# Patient Record
Sex: Male | Born: 1965
Health system: Southern US, Community
[De-identification: ages and names within clinical notes are randomized; demographics above are authoritative.]

## PROBLEM LIST (undated history)

## (undated) DIAGNOSIS — J45909 Unspecified asthma, uncomplicated: Secondary | ICD-10-CM

## (undated) DIAGNOSIS — K2270 Barrett's esophagus without dysplasia: Secondary | ICD-10-CM

## (undated) DIAGNOSIS — C9110 Chronic lymphocytic leukemia of B-cell type not having achieved remission: Secondary | ICD-10-CM

## (undated) DIAGNOSIS — Z9109 Other allergy status, other than to drugs and biological substances: Secondary | ICD-10-CM

## (undated) DIAGNOSIS — I10 Essential (primary) hypertension: Secondary | ICD-10-CM

## (undated) DIAGNOSIS — R011 Cardiac murmur, unspecified: Secondary | ICD-10-CM

## (undated) DIAGNOSIS — K219 Gastro-esophageal reflux disease without esophagitis: Secondary | ICD-10-CM

## (undated) DIAGNOSIS — R5383 Other fatigue: Secondary | ICD-10-CM

## (undated) HISTORY — DX: Unspecified asthma, uncomplicated: J45.909

## (undated) HISTORY — DX: Gastro-esophageal reflux disease without esophagitis: K21.9

## (undated) HISTORY — PX: CHOLECYSTECTOMY: SHX55

## (undated) HISTORY — DX: Other fatigue: R53.83

## (undated) HISTORY — PX: APPENDECTOMY: SHX54

## (undated) HISTORY — DX: Chronic lymphocytic leukemia of B-cell type not having achieved remission: C91.10

## (undated) HISTORY — DX: Other allergy status, other than to drugs and biological substances: Z91.09

## (undated) HISTORY — DX: Barrett's esophagus without dysplasia: K22.70

## (undated) HISTORY — DX: Cardiac murmur, unspecified: R01.1

## (undated) HISTORY — DX: Essential (primary) hypertension: I10

## (undated) HISTORY — PX: WISDOM TOOTH EXTRACTION: SHX21

---

## 2013-09-14 ENCOUNTER — Ambulatory Visit: Payer: Self-pay | Admitting: Physician Assistant

## 2013-09-22 ENCOUNTER — Ambulatory Visit: Payer: Self-pay | Admitting: Physician Assistant

## 2013-11-24 ENCOUNTER — Ambulatory Visit (INDEPENDENT_AMBULATORY_CARE_PROVIDER_SITE_OTHER): Payer: BC Managed Care – PPO | Admitting: Physician Assistant

## 2013-11-24 ENCOUNTER — Ambulatory Visit: Payer: Self-pay | Admitting: Physician Assistant

## 2013-11-24 ENCOUNTER — Encounter: Payer: Self-pay | Admitting: Physician Assistant

## 2013-11-24 VITALS — BP 98/68 | HR 87 | Temp 98.7°F | Resp 16 | Ht 68.0 in | Wt 196.0 lb

## 2013-11-24 DIAGNOSIS — R1013 Epigastric pain: Secondary | ICD-10-CM

## 2013-11-24 DIAGNOSIS — G8929 Other chronic pain: Secondary | ICD-10-CM

## 2013-11-24 DIAGNOSIS — R5383 Other fatigue: Secondary | ICD-10-CM

## 2013-11-24 DIAGNOSIS — Z0000 Encounter for general adult medical examination without abnormal findings: Secondary | ICD-10-CM

## 2013-11-24 DIAGNOSIS — K219 Gastro-esophageal reflux disease without esophagitis: Secondary | ICD-10-CM

## 2013-11-24 DIAGNOSIS — R5381 Other malaise: Secondary | ICD-10-CM

## 2013-11-24 DIAGNOSIS — I10 Essential (primary) hypertension: Secondary | ICD-10-CM

## 2013-11-24 NOTE — Progress Notes (Signed)
Patient presents to clinic today to establish care.  Acute Concerns: Fatigue -- Patient endorses fatigue over the past several months.  Denies change in libido or erectile dysfunction.  Denies change in stress levels.  Denies depressed mood.  Denies hx of MI or CVA.  Denies chest pain or shortness of breath.  Patient endorses sleeping "decent" at night.  Denies fever, night sweats, unintentional weight loss.  Denies hx of cancer.  Endorses history of elevated WBC count.  States he was told by previous MD that it would be monitored.    Chronic Issues: Hypertension -- Patient currently on Lisinopril 20 mg daily.  Endorses taking medications as prescribed. Denies chronic cough.  BP 100/78 at today's visit.  Patient endorses BP is normally in the 120-130s/80s range according to patient.  Denies chest pain, palpitations, shortness of breath, lightheadedness.   Denies chronic cough.  GERD -- well-controlled with omeprazole.  Chronic Abdominal Pain -- Patient has seen previous specialist and had colonoscopy and EGD without abnormalities.  Is unsure if he has ever been told he has an H. Pylori infection.     Health Maintenance: Dental -- UTD Vision -- UTD Immunizations -- Last Tetanus 8 years ago. Colonoscopy -- Last in 2014 due to GI complaints. Some polyps noted -- benign. Due in 2019.  Past Medical History  Diagnosis Date  . GERD (gastroesophageal reflux disease)   . HTN (hypertension)   . Environmental allergies   . Asthma     childhood, resolved    Past Surgical History  Procedure Laterality Date  . Appendectomy    . Wisdom tooth extraction    . Cholecystectomy      No current outpatient prescriptions on file prior to visit.   No current facility-administered medications on file prior to visit.    No Known Allergies  Family History  Problem Relation Age of Onset  . Hypertension Mother     Living  . Hypertension Father 7    Deceased  . Hypertension Maternal Grandmother    . Hypertension Maternal Grandfather   . Healthy Sister   . Healthy Son     x1  . Healthy Daughter     x2  . Cancer Neg Hx   . Diabetes Neg Hx   . Heart disease Neg Hx    History   Social History  . Marital Status: Married    Spouse Name: N/A    Number of Children: N/A  . Years of Education: N/A   Occupational History  . Not on file.   Social History Main Topics  . Smoking status: Never Smoker   . Smokeless tobacco: Never Used  . Alcohol Use: 1.8 oz/week    3 Glasses of wine per week  . Drug Use: No  . Sexual Activity: Not on file   Other Topics Concern  . Not on file   Social History Narrative  . No narrative on file   Review of Systems  Constitutional: Negative for fever and weight loss.  HENT: Negative for ear pain, hearing loss and tinnitus.   Eyes: Negative for blurred vision, double vision, photophobia and pain.  Respiratory: Negative for cough and shortness of breath.   Cardiovascular: Negative for chest pain and palpitations.  Gastrointestinal: Positive for heartburn and abdominal pain. Negative for nausea, vomiting, diarrhea, constipation, blood in stool and melena.  Genitourinary: Negative for dysuria, urgency, frequency, hematuria and flank pain.       Nocturia x 0-1.  Neurological: Positive for headaches. Negative  for dizziness and loss of consciousness.  Psychiatric/Behavioral: Negative for depression, suicidal ideas, hallucinations and substance abuse. The patient is not nervous/anxious.    BP 98/68  Pulse 87  Temp(Src) 98.7 F (37.1 C) (Oral)  Resp 16  Ht 5\' 8"  (1.727 m)  Wt 196 lb (88.905 kg)  BMI 29.81 kg/m2  SpO2 98%  Physical Exam  Vitals reviewed. Constitutional: He is oriented to person, place, and time and well-developed, well-nourished, and in no distress.  HENT:  Head: Normocephalic and atraumatic.  Right Ear: External ear normal.  Left Ear: External ear normal.  Nose: Nose normal.  Mouth/Throat: Oropharynx is clear and moist.  No oropharyngeal exudate.  TM within normal limits bilaterally.  Eyes: Conjunctivae and EOM are normal. Pupils are equal, round, and reactive to light.  Neck: Neck supple. No thyromegaly present.  Cardiovascular: Normal rate, regular rhythm, normal heart sounds and intact distal pulses.   No murmur heard. Pulmonary/Chest: Effort normal and breath sounds normal. No respiratory distress. He has no wheezes. He has no rales. He exhibits no tenderness.  Abdominal: Soft. Bowel sounds are normal. He exhibits no distension and no mass. There is no tenderness. There is no rebound and no guarding.  Lymphadenopathy:    He has no cervical adenopathy.  Neurological: He is alert and oriented to person, place, and time. No cranial nerve deficit.  Skin: Skin is warm and dry. No rash noted.  Psychiatric: Affect normal.   Assessment/Plan: Visit for preventive health examination Medical history reviewed and updated.  Will obtain fasting labs.  Fatigue Will obtain lab workup including CBC, CMP, TSH, Testosterone, etc.  IF leukocytosis is present, giving history we will refer to Hematology/Oncology for further assessment.  Abdominal pain, chronic, epigastric Has had previous workup.  Will recheck CBC, CMP, lipase and H. Pylori.  Continue PPI.  If workup unremarkable, will set up with GI.  Hypertension Asymptomatic.  Continue current regimen.  Will continue to monitor BP at subsequent visits.  GERD (gastroesophageal reflux disease) Endorses good symptoms relief with daily omeprazole.  Continue current regimen.

## 2013-11-24 NOTE — Progress Notes (Signed)
Pre visit review using our clinic review tool, if applicable. No additional management support is needed unless otherwise documented below in the visit note/SLS  

## 2013-11-24 NOTE — Patient Instructions (Signed)
Please obtain labs.  I will call you with your results.  Please continue medications as directed.  I will call you once I have received your records from [place]. We will set you up with a specialist here for further evaluation of your abdominal symptoms.  Fatigue Fatigue is a feeling of tiredness, lack of energy, lack of motivation, or feeling tired all the time. Having enough rest, good nutrition, and reducing stress will normally reduce fatigue. Consult your caregiver if it persists. The nature of your fatigue will help your caregiver to find out its cause. The treatment is based on the cause.  CAUSES  There are many causes for fatigue. Most of the time, fatigue can be traced to one or more of your habits or routines. Most causes fit into one or more of three general areas. They are: Lifestyle problems  Sleep disturbances.  Overwork.  Physical exertion.  Unhealthy habits.  Poor eating habits or eating disorders.  Alcohol and/or drug use .  Lack of proper nutrition (malnutrition). Psychological problems  Stress and/or anxiety problems.  Depression.  Grief.  Boredom. Medical Problems or Conditions  Anemia.  Pregnancy.  Thyroid gland problems.  Recovery from major surgery.  Continuous pain.  Emphysema or asthma that is not well controlled  Allergic conditions.  Diabetes.  Infections (such as mononucleosis).  Obesity.  Sleep disorders, such as sleep apnea.  Heart failure or other heart-related problems.  Cancer.  Kidney disease.  Liver disease.  Effects of certain medicines such as antihistamines, cough and cold remedies, prescription pain medicines, heart and blood pressure medicines, drugs used for treatment of cancer, and some antidepressants. SYMPTOMS  The symptoms of fatigue include:   Lack of energy.  Lack of drive (motivation).  Drowsiness.  Feeling of indifference to the surroundings. DIAGNOSIS  The details of how you feel help  guide your caregiver in finding out what is causing the fatigue. You will be asked about your present and past health condition. It is important to review all medicines that you take, including prescription and non-prescription items. A thorough exam will be done. You will be questioned about your feelings, habits, and normal lifestyle. Your caregiver may suggest blood tests, urine tests, or other tests to look for common medical causes of fatigue.  TREATMENT  Fatigue is treated by correcting the underlying cause. For example, if you have continuous pain or depression, treating these causes will improve how you feel. Similarly, adjusting the dose of certain medicines will help in reducing fatigue.  HOME CARE INSTRUCTIONS   Try to get the required amount of good sleep every night.  Eat a healthy and nutritious diet, and drink enough water throughout the day.  Practice ways of relaxing (including yoga or meditation).  Exercise regularly.  Make plans to change situations that cause stress. Act on those plans so that stresses decrease over time. Keep your work and personal routine reasonable.  Avoid street drugs and minimize use of alcohol.  Start taking a daily multivitamin after consulting your caregiver. SEEK MEDICAL CARE IF:   You have persistent tiredness, which cannot be accounted for.  You have fever.  You have unintentional weight loss.  You have headaches.  You have disturbed sleep throughout the night.  You are feeling sad.  You have constipation.  You have dry skin.  You have gained weight.  You are taking any new or different medicines that you suspect are causing fatigue.  You are unable to sleep at night.  You develop  any unusual swelling of your legs or other parts of your body. SEEK IMMEDIATE MEDICAL CARE IF:   You are feeling confused.  Your vision is blurred.  You feel faint or pass out.  You develop severe headache.  You develop severe abdominal,  pelvic, or back pain.  You develop chest pain, shortness of breath, or an irregular or fast heartbeat.  You are unable to pass a normal amount of urine.  You develop abnormal bleeding such as bleeding from the rectum or you vomit blood.  You have thoughts about harming yourself or committing suicide.  You are worried that you might harm someone else. MAKE SURE YOU:   Understand these instructions.  Will watch your condition.  Will get help right away if you are not doing well or get worse. Document Released: 05/11/2007 Document Revised: 10/06/2011 Document Reviewed: 05/11/2007 [facility] Patient Information 2014 [place].

## 2013-11-25 ENCOUNTER — Telehealth: Payer: Self-pay | Admitting: Physician Assistant

## 2013-11-25 DIAGNOSIS — D72829 Elevated white blood cell count, unspecified: Secondary | ICD-10-CM

## 2013-11-25 LAB — URINALYSIS, ROUTINE W REFLEX MICROSCOPIC
Bilirubin Urine: NEGATIVE
Glucose, UA: NEGATIVE mg/dL
Hgb urine dipstick: NEGATIVE
Ketones, ur: NEGATIVE mg/dL
Leukocytes, UA: NEGATIVE
Nitrite: NEGATIVE
Protein, ur: NEGATIVE mg/dL
Specific Gravity, Urine: 1.017 (ref 1.005–1.030)
Urobilinogen, UA: 0.2 mg/dL (ref 0.0–1.0)
pH: 5 (ref 5.0–8.0)

## 2013-11-25 LAB — TSH: TSH: 0.706 u[IU]/mL (ref 0.350–4.500)

## 2013-11-25 LAB — HEPATIC FUNCTION PANEL
ALT: 24 U/L (ref 0–53)
AST: 21 U/L (ref 0–37)
Albumin: 4.8 g/dL (ref 3.5–5.2)
Alkaline Phosphatase: 62 U/L (ref 39–117)
Bilirubin, Direct: 0.1 mg/dL (ref 0.0–0.3)
Indirect Bilirubin: 0.7 mg/dL (ref 0.2–1.2)
Total Bilirubin: 0.8 mg/dL (ref 0.2–1.2)
Total Protein: 6.8 g/dL (ref 6.0–8.3)

## 2013-11-25 LAB — H. PYLORI ANTIBODY, IGG: H Pylori IgG: <0.4 {ISR}

## 2013-11-25 LAB — CBC WITH DIFFERENTIAL/PLATELET
Basophils Absolute: 0 10*3/uL (ref 0.0–0.1)
Basophils Relative: 0 % (ref 0–1)
Eosinophils Absolute: 0 10*3/uL (ref 0.0–0.7)
Eosinophils Relative: 0 % (ref 0–5)
HCT: 39.6 % (ref 39.0–52.0)
Hemoglobin: 14 g/dL (ref 13.0–17.0)
Lymphocytes Relative: 83 % — ABNORMAL HIGH (ref 12–46)
Lymphs Abs: 22.4 10*3/uL — ABNORMAL HIGH (ref 0.7–4.0)
MCH: 31.3 pg (ref 26.0–34.0)
MCHC: 35.4 g/dL (ref 30.0–36.0)
MCV: 88.6 fL (ref 78.0–100.0)
Monocytes Absolute: 0.8 10*3/uL (ref 0.1–1.0)
Monocytes Relative: 3 % (ref 3–12)
Neutro Abs: 3.8 10*3/uL (ref 1.7–7.7)
Neutrophils Relative %: 14 % — ABNORMAL LOW (ref 43–77)
Platelets: 119 10*3/uL — ABNORMAL LOW (ref 150–400)
RBC: 4.47 MIL/uL (ref 4.22–5.81)
RDW: 13.4 % (ref 11.5–15.5)
WBC: 27 10*3/uL — ABNORMAL HIGH (ref 4.0–10.5)

## 2013-11-25 LAB — URINALYSIS, MICROSCOPIC ONLY
Bacteria, UA: NONE SEEN
Casts: NONE SEEN
Crystals: NONE SEEN
Squamous Epithelial / LPF: NONE SEEN

## 2013-11-25 LAB — PATHOLOGIST SMEAR REVIEW

## 2013-11-25 LAB — TESTOSTERONE, FREE, TOTAL, SHBG
Sex Hormone Binding: 32 nmol/L (ref 13–71)
Testosterone, Free: 52 pg/mL (ref 47.0–244.0)
Testosterone-% Free: 2 % (ref 1.6–2.9)
Testosterone: 263 ng/dL — ABNORMAL LOW (ref 300–890)

## 2013-11-25 LAB — BASIC METABOLIC PANEL WITH GFR
BUN: 15 mg/dL (ref 6–23)
CO2: 26 mEq/L (ref 19–32)
Calcium: 9.8 mg/dL (ref 8.4–10.5)
Chloride: 102 mEq/L (ref 96–112)
Creat: 1.14 mg/dL (ref 0.50–1.35)
GFR, Est African American: 87 mL/min
GFR, Est Non African American: 76 mL/min
Glucose, Bld: 96 mg/dL (ref 70–99)
Potassium: 4 mEq/L (ref 3.5–5.3)
Sodium: 139 mEq/L (ref 135–145)

## 2013-11-25 LAB — HEMOGLOBIN A1C
Hgb A1c MFr Bld: 5.5 % (ref <5.7)
Mean Plasma Glucose: 111 mg/dL (ref <117)

## 2013-11-25 LAB — LIPASE: Lipase: 18 U/L (ref 0–75)

## 2013-11-25 NOTE — Telephone Encounter (Signed)
Discussed results with patient.  Testosterone low.  Patient will need to return in 3-4 weeks for repeat testosterone level to confirm deficiency.  Significant WBC elevation with history of leukocytosis.  Urgent referral placed to Hematology/Oncology.  Patient voices understanding.  Follow-up in 3 weeks.

## 2013-11-28 DIAGNOSIS — I10 Essential (primary) hypertension: Secondary | ICD-10-CM | POA: Insufficient documentation

## 2013-11-28 DIAGNOSIS — Z0000 Encounter for general adult medical examination without abnormal findings: Secondary | ICD-10-CM | POA: Insufficient documentation

## 2013-11-28 DIAGNOSIS — G8929 Other chronic pain: Secondary | ICD-10-CM | POA: Insufficient documentation

## 2013-11-28 DIAGNOSIS — R5383 Other fatigue: Secondary | ICD-10-CM | POA: Insufficient documentation

## 2013-11-28 DIAGNOSIS — R1013 Epigastric pain: Secondary | ICD-10-CM

## 2013-11-28 DIAGNOSIS — K219 Gastro-esophageal reflux disease without esophagitis: Secondary | ICD-10-CM | POA: Insufficient documentation

## 2013-11-28 LAB — VITAMIN D 1,25 DIHYDROXY
Vitamin D 1, 25 (OH)2 Total: 56 pg/mL (ref 18–72)
Vitamin D2 1, 25 (OH)2: <8 pg/mL
Vitamin D3 1, 25 (OH)2: 56 pg/mL

## 2013-11-28 NOTE — Assessment & Plan Note (Signed)
Will obtain lab workup including CBC, CMP, TSH, Testosterone, etc.  IF leukocytosis is present, giving history we will refer to Hematology/Oncology for further assessment.

## 2013-11-28 NOTE — Assessment & Plan Note (Signed)
Asymptomatic.  Continue current regimen.  Will continue to monitor BP at subsequent visits.

## 2013-11-28 NOTE — Assessment & Plan Note (Signed)
Endorses good symptoms relief with daily omeprazole.  Continue current regimen.

## 2013-11-28 NOTE — Assessment & Plan Note (Signed)
Has had previous workup.  Will recheck CBC, CMP, lipase and H. Pylori.  Continue PPI.  If workup unremarkable, will set up with GI.

## 2013-11-28 NOTE — Assessment & Plan Note (Signed)
Medical history reviewed and updated.  Will obtain fasting labs. 

## 2013-12-05 ENCOUNTER — Encounter: Payer: Self-pay | Admitting: Physician Assistant

## 2013-12-05 ENCOUNTER — Telehealth: Payer: Self-pay | Admitting: Physician Assistant

## 2013-12-05 NOTE — Telephone Encounter (Signed)
Patient is requesting last lab results 

## 2013-12-05 NOTE — Telephone Encounter (Signed)
Result Notes    Notes Recorded by Leeanne Rio, PA-C on 11/25/2013 at 3:34 PM Called patient personally with results. See Phone note 11/25/13   Please Advise.

## 2013-12-05 NOTE — Telephone Encounter (Signed)
Taken from my telephone note on 11/25/13 -- "Discussed results with patient. Testosterone low. Patient will need to return in 3-4 weeks for repeat testosterone level to confirm deficiency. Significant WBC elevation with history of leukocytosis. Urgent referral placed to Hematology/Oncology. Patient voices understanding. Follow-up in 3 weeks."    As above, I have already discussed results with patient.  Is he requesting a printed copy?  Please clarify.

## 2013-12-06 NOTE — Telephone Encounter (Signed)
Called patient and he is requesting to pick up results. Printed results and will place at front desk

## 2014-04-05 ENCOUNTER — Telehealth: Payer: Self-pay | Admitting: Physician Assistant

## 2014-04-05 ENCOUNTER — Other Ambulatory Visit: Payer: Self-pay | Admitting: Physician Assistant

## 2014-04-05 MED ORDER — OMEPRAZOLE 40 MG PO CPDR: 40.0000 mg | DELAYED_RELEASE_CAPSULE | Freq: Every day | ORAL | Status: DC

## 2014-04-05 NOTE — Telephone Encounter (Signed)
Rx request to pharmacy/SLS  

## 2014-04-05 NOTE — Telephone Encounter (Signed)
Pt is needing new rx for omeprazole (PRILOSEC) 40 MG capsule send to [facility] on [address]

## 2014-05-12 ENCOUNTER — Encounter: Payer: Self-pay | Admitting: Physician Assistant

## 2014-05-12 ENCOUNTER — Ambulatory Visit (INDEPENDENT_AMBULATORY_CARE_PROVIDER_SITE_OTHER): Payer: BC Managed Care – PPO | Admitting: Physician Assistant

## 2014-05-12 VITALS — BP 144/108 | HR 92 | Temp 98.5°F | Resp 16 | Ht 68.0 in | Wt 194.5 lb

## 2014-05-12 DIAGNOSIS — I10 Essential (primary) hypertension: Secondary | ICD-10-CM

## 2014-05-12 DIAGNOSIS — J019 Acute sinusitis, unspecified: Secondary | ICD-10-CM

## 2014-05-12 DIAGNOSIS — J309 Allergic rhinitis, unspecified: Secondary | ICD-10-CM | POA: Insufficient documentation

## 2014-05-12 DIAGNOSIS — B9689 Other specified bacterial agents as the cause of diseases classified elsewhere: Secondary | ICD-10-CM

## 2014-05-12 MED ORDER — LISINOPRIL 20 MG PO TABS: 20.0000 mg | ORAL_TABLET | Freq: Every day | ORAL | Status: DC

## 2014-05-12 MED ORDER — FLUTICASONE PROPIONATE 50 MCG/ACT NA SUSP: 2.0000 | Freq: Every day | NASAL | Status: DC

## 2014-05-12 MED ORDER — METHYLPREDNISOLONE ACETATE 40 MG/ML IJ SUSP
40.0000 mg | Freq: Once | INTRAMUSCULAR | Status: AC
  Administered 2014-05-12: 40 mg via INTRAMUSCULAR

## 2014-05-12 MED ORDER — OMEPRAZOLE 40 MG PO CPDR: DELAYED_RELEASE_CAPSULE | ORAL | Status: DC

## 2014-05-12 MED ORDER — FEXOFENADINE HCL 180 MG PO TABS: 180.0000 mg | ORAL_TABLET | Freq: Every day | ORAL | Status: DC

## 2014-05-12 MED ORDER — AMOXICILLIN-POT CLAVULANATE 875-125 MG PO TABS: 1.0000 | ORAL_TABLET | Freq: Two times a day (BID) | ORAL | Status: DC

## 2014-05-12 NOTE — Progress Notes (Signed)
History of Present Illness: Todd Novak is a 48 y.o. male who present to the clinic today complaining of sinus pressure, sinus pain and nasal congestion. Patient endorses ear pain and tooth pain. Endorses difficulty sleeping. Patient denies fever, chills, SOB or pleuritic chest pain.  Denies recent travel or sick contact.  History: Past Medical History  Diagnosis Date  . GERD (gastroesophageal reflux disease)   . HTN (hypertension)   . Environmental allergies   . Asthma     childhood, resolved   Current outpatient prescriptions:acetaminophen (TYLENOL) 325 MG tablet, Take 650 mg by mouth as needed., Disp: , Rfl: ;  aspirin 325 MG tablet, Take 325 mg by mouth as needed., Disp: , Rfl: ;  lisinopril (PRINIVIL,ZESTRIL) 20 MG tablet, Take 1 tablet (20 mg total) by mouth daily., Disp: 90 tablet, Rfl: 1;  Multiple Vitamin (MULTIVITAMIN) tablet, Take 1 tablet by mouth daily., Disp: , Rfl:  omeprazole (PRILOSEC) 40 MG capsule, TAKE ONE CAPSULE BY MOUTH DAILY, Disp: 90 capsule, Rfl: 1;  pseudoephedrine (SUDAFED) 30 MG tablet, Take 30 mg by mouth every 4 (four) hours as needed for congestion (Allergies)., Disp: , Rfl: ;  amoxicillin-clavulanate (AUGMENTIN) 875-125 MG per tablet, Take 1 tablet by mouth 2 (two) times daily., Disp: 20 tablet, Rfl: 0 fexofenadine (ALLEGRA ALLERGY) 180 MG tablet, Take 1 tablet (180 mg total) by mouth daily., Disp: 30 tablet, Rfl: 3;  fluticasone (FLONASE) 50 MCG/ACT nasal spray, Place 2 sprays into both nostrils daily., Disp: 16 g, Rfl: 6 No Known Allergies Family History  Problem Relation Age of Onset  . Hypertension Mother     Living  . Hypertension Father 50    Deceased  . Hypertension Maternal Grandmother   . Hypertension Maternal Grandfather   . Healthy Sister   . Healthy Son     x1  . Healthy Daughter     x2  . Cancer Neg Hx   . Diabetes Neg Hx   . Heart disease Neg Hx    History   Social History  . Marital Status: Married    Spouse Name: N/A   Number of Children: N/A  . Years of Education: N/A   Social History Main Topics  . Smoking status: Never Smoker   . Smokeless tobacco: Never Used  . Alcohol Use: 1.8 oz/week    3 Glasses of wine per week  . Drug Use: No  . Sexual Activity: None   Other Topics Concern  . None   Social History Narrative  . None    Review of Systems: See HPI.  All other ROS are negative.  Physical Examination: BP 144/108  Pulse 92  Temp(Src) 98.5 F (36.9 C) (Oral)  Resp 16  Ht 5\' 8"  (1.727 m)  Wt 194 lb 8 oz (88.225 kg)  BMI 29.58 kg/m2  SpO2 99%  General appearance: alert, cooperative and appears stated age Head: Normocephalic, without obvious abnormality, atraumatic, sinuses tender to percussion Eyes: conjunctivae/corneas clear. PERRL, EOM's intact. Fundi benign. Ears: normal TM's and external ear canals both ears Nose: moderate congestion, turbinates swollen, sinus tenderness bilateral Throat: lips, mucosa, and tongue normal; teeth and gums normal Neck: no adenopathy, no carotid bruit, no JVD, supple, symmetrical, trachea midline and thyroid not enlarged, symmetric, no tenderness/mass/nodules Lungs: clear to auscultation bilaterally Chest wall: no tenderness  Labs/Diagnostics: None performed.  Assessment/Plan: Acute bacterial sinusitis Rx Augmentin.  40 mg IM Depomedrol given.  Increase fluids.  Rest.  Flonase and Allegra daily.  Humidifier in bedroom.  Return precautions  discussed with patient.  Rhinitis, allergic Rx Flonase and Allegra to use daily.

## 2014-05-12 NOTE — Assessment & Plan Note (Signed)
Rx Augmentin.  40 mg IM Depomedrol given.  Increase fluids.  Rest.  Flonase and Allegra daily.  Humidifier in bedroom.  Return precautions discussed with patient.

## 2014-05-12 NOTE — Assessment & Plan Note (Signed)
Rx Flonase and Allegra to use daily.

## 2014-05-12 NOTE — Addendum Note (Signed)
Addended by: Rockwell Germany on: 05/12/2014 04:55 PM   Modules accepted: Orders

## 2014-05-12 NOTE — Patient Instructions (Signed)
Please take antibiotic as directed.  Increase fluid intake.  Use Saline nasal spray.  Take a daily multivitamin. Use Flonase and Allegra daily.  Place a humidifier in the bedroom.  Please call or return clinic if symptoms are not improving.  Sinusitis Sinusitis is redness, soreness, and swelling (inflammation) of the paranasal sinuses. Paranasal sinuses are air pockets within the bones of your face (beneath the eyes, the middle of the forehead, or above the eyes). In healthy paranasal sinuses, mucus is able to drain out, and air is able to circulate through them by way of your nose. However, when your paranasal sinuses are inflamed, mucus and air can become trapped. This can allow bacteria and other germs to grow and cause infection. Sinusitis can develop quickly and last only a short time (acute) or continue over a long period (chronic). Sinusitis that lasts for more than 12 weeks is considered chronic.  CAUSES  Causes of sinusitis include:  Allergies.  Structural abnormalities, such as displacement of the cartilage that separates your nostrils (deviated septum), which can decrease the air flow through your nose and sinuses and affect sinus drainage.  Functional abnormalities, such as when the small hairs (cilia) that line your sinuses and help remove mucus do not work properly or are not present. SYMPTOMS  Symptoms of acute and chronic sinusitis are the same. The primary symptoms are pain and pressure around the affected sinuses. Other symptoms include:  Upper toothache.  Earache.  Headache.  Bad breath.  Decreased sense of smell and taste.  A cough, which worsens when you are lying flat.  Fatigue.  Fever.  Thick drainage from your nose, which often is green and may contain pus (purulent).  Swelling and warmth over the affected sinuses. DIAGNOSIS  Your caregiver will perform a physical exam. During the exam, your caregiver may:  Look in your nose for signs of abnormal growths  in your nostrils (nasal polyps).  Tap over the affected sinus to check for signs of infection.  View the inside of your sinuses (endoscopy) with a special imaging device with a light attached (endoscope), which is inserted into your sinuses. If your caregiver suspects that you have chronic sinusitis, one or more of the following tests may be recommended:  Allergy tests.  Nasal culture A sample of mucus is taken from your nose and sent to a lab and screened for bacteria.  Nasal cytology A sample of mucus is taken from your nose and examined by your caregiver to determine if your sinusitis is related to an allergy. TREATMENT  Most cases of acute sinusitis are related to a viral infection and will resolve on their own within 10 days. Sometimes medicines are prescribed to help relieve symptoms (pain medicine, decongestants, nasal steroid sprays, or saline sprays).  However, for sinusitis related to a bacterial infection, your caregiver will prescribe antibiotic medicines. These are medicines that will help kill the bacteria causing the infection.  Rarely, sinusitis is caused by a fungal infection. In theses cases, your caregiver will prescribe antifungal medicine. For some cases of chronic sinusitis, surgery is needed. Generally, these are cases in which sinusitis recurs more than 3 times per year, despite other treatments. HOME CARE INSTRUCTIONS   Drink plenty of water. Water helps thin the mucus so your sinuses can drain more easily.  Use a humidifier.  Inhale steam 3 to 4 times a day (for example, sit in the bathroom with the shower running).  Apply a warm, moist washcloth to your face 3  to 4 times a day, or as directed by your caregiver.  Use saline nasal sprays to help moisten and clean your sinuses.  Take over-the-counter or prescription medicines for pain, discomfort, or fever only as directed by your caregiver. SEEK IMMEDIATE MEDICAL CARE IF:  You have increasing pain or severe  headaches.  You have nausea, vomiting, or drowsiness.  You have swelling around your face.  You have vision problems.  You have a stiff neck.  You have difficulty breathing. MAKE SURE YOU:   Understand these instructions.  Will watch your condition.  Will get help right away if you are not doing well or get worse. Document Released: 07/14/2005 Document Revised: 10/06/2011 Document Reviewed: 07/29/2011 [facility] Patient Information 2014 [place], [place].

## 2014-05-15 ENCOUNTER — Telehealth: Payer: Self-pay | Admitting: Physician Assistant

## 2014-05-15 ENCOUNTER — Telehealth: Payer: Self-pay | Admitting: *Deleted

## 2014-05-15 DIAGNOSIS — J019 Acute sinusitis, unspecified: Principal | ICD-10-CM

## 2014-05-15 DIAGNOSIS — B9689 Other specified bacterial agents as the cause of diseases classified elsewhere: Secondary | ICD-10-CM

## 2014-05-15 MED ORDER — DOXYCYCLINE HYCLATE 100 MG PO CAPS: 100.0000 mg | ORAL_CAPSULE | Freq: Two times a day (BID) | ORAL | Status: DC

## 2014-05-15 MED ORDER — MOXIFLOXACIN HCL 400 MG PO TABS: 400.0000 mg | ORAL_TABLET | Freq: Every day | ORAL | Status: DC

## 2014-05-15 NOTE — Telephone Encounter (Signed)
Stop Augmentin.  Rx Avelox sent to pharmacy. Patient to take as directed.

## 2014-05-15 NOTE — Telephone Encounter (Signed)
Caller name: Temitope Call back number:[phone] Pharmacy:  Reason for call:  Pt states that the Rx moxifloxacin (AVELOX) 400 MG tablet is going to cost him 150$.  Would like something else called over.  He is at pharmacy now.

## 2014-05-15 NOTE — Telephone Encounter (Signed)
lvm for patient

## 2014-05-15 NOTE — Telephone Encounter (Signed)
Caller name: Ozzie  Relation to pt: self  Call back number: [phone] Pharmacy: [facility] [phone]   Reason for call:  Pt states antibiotics is making him feel nausea pt is requesting a new antibiotics.

## 2014-05-15 NOTE — Telephone Encounter (Signed)
Doxycycline sent to pharmacy.  Please inform patient.

## 2014-05-15 NOTE — Telephone Encounter (Signed)
Please Advise

## 2014-06-21 ENCOUNTER — Ambulatory Visit (HOSPITAL_BASED_OUTPATIENT_CLINIC_OR_DEPARTMENT_OTHER)
Admission: RE | Admit: 2014-06-21 | Discharge: 2014-06-21 | Disposition: A | Discharge status: Home / Self Care | Payer: BC Managed Care – PPO | Source: Ambulatory Visit | Attending: Physician Assistant | Admitting: Physician Assistant

## 2014-06-21 ENCOUNTER — Encounter: Payer: Self-pay | Admitting: Physician Assistant

## 2014-06-21 ENCOUNTER — Telehealth: Payer: Self-pay | Admitting: Physician Assistant

## 2014-06-21 ENCOUNTER — Ambulatory Visit (INDEPENDENT_AMBULATORY_CARE_PROVIDER_SITE_OTHER): Payer: BC Managed Care – PPO | Admitting: Physician Assistant

## 2014-06-21 VITALS — BP 128/84 | HR 88 | Temp 98.5°F | Resp 16 | Ht 68.0 in | Wt 196.0 lb

## 2014-06-21 DIAGNOSIS — M5416 Radiculopathy, lumbar region: Secondary | ICD-10-CM

## 2014-06-21 DIAGNOSIS — D72829 Elevated white blood cell count, unspecified: Secondary | ICD-10-CM

## 2014-06-21 DIAGNOSIS — M79606 Pain in leg, unspecified: Secondary | ICD-10-CM | POA: Diagnosis not present

## 2014-06-21 DIAGNOSIS — R937 Abnormal findings on diagnostic imaging of other parts of musculoskeletal system: Secondary | ICD-10-CM | POA: Insufficient documentation

## 2014-06-21 DIAGNOSIS — M5127 Other intervertebral disc displacement, lumbosacral region: Secondary | ICD-10-CM | POA: Insufficient documentation

## 2014-06-21 DIAGNOSIS — M545 Low back pain: Secondary | ICD-10-CM | POA: Diagnosis present

## 2014-06-21 MED ORDER — METHYLPREDNISOLONE (PAK) 4 MG PO TABS: ORAL_TABLET | ORAL | Status: DC

## 2014-06-21 MED ORDER — METHOCARBAMOL 500 MG PO TABS: ORAL_TABLET | ORAL | Status: DC

## 2014-06-21 MED ORDER — TRAMADOL HCL 50 MG PO TABS: 50.0000 mg | ORAL_TABLET | Freq: Three times a day (TID) | ORAL | Status: DC | PRN

## 2014-06-21 NOTE — Patient Instructions (Signed)
Please take medications as directed.  Go downstairs for x-ray.  I will call you with your results and we will quickly proceed with MRI.  Read stretching exercises below. Try some of this once pain is subsiding. No trips to Chiropractor until we have obtained MRI.  Sciatica Sciatica is pain, weakness, numbness, or tingling along your sciatic nerve. The nerve starts in the lower back and runs down the back of each leg. Nerve damage or certain conditions pinch or put pressure on the sciatic nerve. This causes the pain, weakness, and other discomforts of sciatica. HOME CARE   Only take medicine as told by your doctor.  Apply ice to the affected area for 20 minutes. Do this 3-4 times a day for the first 48-72 hours. Then try heat in the same way.  Exercise, stretch, or do your usual activities if these do not make your pain worse.  Go to physical therapy as told by your doctor.  Keep all doctor visits as told.  Do not wear high heels or shoes that are not supportive.  Get a firm mattress if your mattress is too soft to lessen pain and discomfort. GET HELP RIGHT AWAY IF:   You cannot control when you poop (bowel movement) or pee (urinate).  You have more weakness in your lower back, lower belly (pelvis), butt (buttocks), or legs.  You have redness or puffiness (swelling) of your back.  You have a burning feeling when you pee.  You have pain that gets worse when you lie down.  You have pain that wakes you from your sleep.  Your pain is worse than past pain.  Your pain lasts longer than 4 weeks.  You are suddenly losing weight without reason. MAKE SURE YOU:   Understand these instructions.  Will watch this condition.  Will get help right away if you are not doing well or get worse. Document Released: 04/22/2008 Document Revised: 01/13/2012 Document Reviewed: 11/23/2011 [facility] Patient Information 2015 [place], [place]. This information is not intended to replace advice given to  you by your health care provider. Make sure you discuss any questions you have with your health care provider.  Back Exercises Back exercises help treat and prevent back injuries. The goal is to increase your strength in your belly (abdominal) and back muscles. These exercises can also help with flexibility. Start these exercises when told by your doctor. HOME CARE Back exercises include: Pelvic Tilt.  Lie on your back with your knees bent. Tilt your pelvis until the lower part of your back is against the floor. Hold this position 5 to 10 sec. Repeat this exercise 5 to 10 times. Knee to Chest.  Pull 1 knee up against your chest and hold for 20 to 30 seconds. Repeat this with the other knee. This may be done with the other leg straight or bent, whichever feels better. Then, pull both knees up against your chest. Sit-Ups or Curl-Ups.  Bend your knees 90 degrees. Start with tilting your pelvis, and do a partial, slow sit-up. Only lift your upper half 30 to 45 degrees off the floor. Take at least 2 to 3 seonds for each sit-up. Do not do sit-ups with your knees out straight. If partial sit-ups are difficult, simply do the above but with only tightening your belly (abdominal) muscles and holding it as told. Hip-Lift.  Lie on your back with your knees flexed 90 degrees. Push down with your feet and shoulders as you raise your hips 2 inches off  the floor. Hold for 10 seconds, repeat 5 to 10 times. Back Arches.  Lie on your stomach. Prop yourself up on bent elbows. Slowly press on your hands, causing an arch in your low back. Repeat 3 to 5 times. Shoulder-Lifts.  Lie face down with arms beside your body. Keep hips and belly pressed to floor as you slowly lift your head and shoulders off the floor. Do not overdo your exercises. Be careful in the beginning. Exercises may cause you some mild back discomfort. If the pain lasts for more than 15 minutes, stop the exercises until you see your doctor.  Improvement with exercise for back problems is slow.  Document Released: 08/16/2010 Document Revised: 10/06/2011 Document Reviewed: 05/15/2011 [facility] Patient Information 2015 [place], [place]. This information is not intended to replace advice given to you by your health care provider. Make sure you discuss any questions you have with your health care provider.

## 2014-06-21 NOTE — Telephone Encounter (Signed)
Patient informed, understood & agreed/SLS  

## 2014-06-21 NOTE — Progress Notes (Signed)
Pre visit review using our clinic review tool, if applicable. No additional management support is needed unless otherwise documented below in the visit note/SLS  

## 2014-06-21 NOTE — Telephone Encounter (Signed)
X-ray unemrakble.  Will proceed with MRI.  PCCs are working on it now.  Will be called to schedule once approved by insurance.

## 2014-06-21 NOTE — Telephone Encounter (Signed)
Spoke with patient concerning results.  Herniated disc noted at L4-L5 and L5-S1.  Will refer to Neurosurgery for further assessment.  MRI also reveals concern for myeloprolifertave disorder.  Last CBC at 27. Patient sent to HemOnc but canceled appointment.  Patient is now willing.  Referral placed.

## 2014-06-21 NOTE — Progress Notes (Signed)
Patient presents to clinic today c/o low back pain bilaterally with radiation into hips and bilateral lower extremities.  Endorses some mild knee buckling.  Endorse bilateral lower extremity weakness.  Has had this problem intermittently over the past few years but current episode has been present for several months and not responding to conservative measures.  Past Medical History  Diagnosis Date  . GERD (gastroesophageal reflux disease)   . HTN (hypertension)   . Environmental allergies   . Asthma     childhood, resolved    Current Outpatient Prescriptions on File Prior to Visit  Medication Sig Dispense Refill  . acetaminophen (TYLENOL) 325 MG tablet Take 650 mg by mouth as needed.    Marland Kitchen aspirin 325 MG tablet Take 325 mg by mouth as needed.    . fexofenadine (ALLEGRA ALLERGY) 180 MG tablet Take 1 tablet (180 mg total) by mouth daily. 30 tablet 3  . fluticasone (FLONASE) 50 MCG/ACT nasal spray Place 2 sprays into both nostrils daily. 16 g 6  . lisinopril (PRINIVIL,ZESTRIL) 20 MG tablet Take 1 tablet (20 mg total) by mouth daily. 90 tablet 1  . Multiple Vitamin (MULTIVITAMIN) tablet Take 1 tablet by mouth daily.    Marland Kitchen omeprazole (PRILOSEC) 40 MG capsule TAKE ONE CAPSULE BY MOUTH DAILY 90 capsule 1  . pseudoephedrine (SUDAFED) 30 MG tablet Take 30 mg by mouth every 4 (four) hours as needed for congestion (Allergies).     No current facility-administered medications on file prior to visit.    No Known Allergies  Family History  Problem Relation Age of Onset  . Hypertension Mother     Living  . Hypertension Father 9    Deceased  . Hypertension Maternal Grandmother   . Hypertension Maternal Grandfather   . Healthy Sister   . Healthy Son     x1  . Healthy Daughter     x2  . Cancer Neg Hx   . Diabetes Neg Hx   . Heart disease Neg Hx     History   Social History  . Marital Status: Married    Spouse Name: N/A    Number of Children: N/A  . Years of Education: N/A    Social History Main Topics  . Smoking status: Never Smoker   . Smokeless tobacco: Never Used  . Alcohol Use: 1.8 oz/week    3 Glasses of wine per week  . Drug Use: No  . Sexual Activity: None   Other Topics Concern  . None   Social History Narrative   Review of Systems - See HPI.  All other ROS are negative.  BP 128/84 mmHg  Pulse 88  Temp(Src) 98.5 F (36.9 C) (Oral)  Resp 16  Ht 5\' 8"  (1.727 m)  Wt 196 lb (88.905 kg)  BMI 29.81 kg/m2  SpO2 100%  Physical Exam  Constitutional: He is oriented to person, place, and time and well-developed, well-nourished, and in no distress.  HENT:  Head: Normocephalic and atraumatic.  Eyes: Conjunctivae are normal. Pupils are equal, round, and reactive to light.  Cardiovascular: Normal rate, regular rhythm, normal heart sounds and intact distal pulses.   Pulmonary/Chest: Breath sounds normal. No respiratory distress. He has no wheezes. He has no rales. He exhibits no tenderness.  Musculoskeletal:       Lumbar back: He exhibits tenderness and pain.  + straight leg raise testing bilaterally.  Neurological: He is alert and oriented to person, place, and time.  Skin: Skin is warm and dry.  No rash noted.  Psychiatric: Affect normal.  Vitals reviewed.  Assessment/Plan: Bilateral lumbar radiculopathy No alarm signs/symptoms present.  Will obtain X-ray of lumbar spine and quickly proceed with MRI.  Rx Medrol dose pack. Rx Robaxin at bedtime.  Tramadol for pain.  Avoid heavy lifting and overexertion.

## 2014-06-21 NOTE — Assessment & Plan Note (Signed)
No alarm signs/symptoms present.  Will obtain X-ray of lumbar spine and quickly proceed with MRI.  Rx Medrol dose pack. Rx Robaxin at bedtime.  Tramadol for pain.  Avoid heavy lifting and overexertion.

## 2014-07-04 ENCOUNTER — Other Ambulatory Visit: Payer: Self-pay | Admitting: Physician Assistant

## 2014-07-04 DIAGNOSIS — M5416 Radiculopathy, lumbar region: Secondary | ICD-10-CM

## 2014-07-24 ENCOUNTER — Telehealth: Payer: Self-pay | Admitting: Physician Assistant

## 2014-07-24 ENCOUNTER — Other Ambulatory Visit: Payer: Self-pay | Admitting: Physician Assistant

## 2014-07-24 NOTE — Telephone Encounter (Signed)
Patient requesting a refill of medrol pack to be sent to [facility] on [address]

## 2014-07-24 NOTE — Telephone Encounter (Signed)
Caller name: kaysin Relation to pt: self Call back number: [phone] Pharmacy:  Reason for call:   Patient states that his chiropractor is requesting a note from patient physician that patient needs to discontinue chiropractic care.

## 2014-07-24 NOTE — Telephone Encounter (Signed)
Rx request Denied, pt has not done F/U labs, nor Hem/Oncology & Neurology Referrals according to chart; pt needs appointment/SLS

## 2014-07-25 NOTE — Telephone Encounter (Signed)
Assessment is the same.  He needs re-evaluation to make sure nothing else is needed at present.  Patient has also been non-compliant with recommendations for Hematology/Oncology and giving his significantly elevated WBC count, I am hesitant to give steroids freely as this affects the immune system.

## 2014-07-25 NOTE — Telephone Encounter (Signed)
Please advise last OV 06-21-14 Medrol pak given same day.   Pt was advised that day to no longer have chiropractic care until MRI was obtained. These results are back but I do not seen any documentation on them

## 2014-07-25 NOTE — Telephone Encounter (Signed)
Spoke with pt advised that the letter and imaging results would be at front desk for pick up. Pt also notified that medications would not be filled without a reassessment. Pt stated that he is going to neurosurgery appt as requested so he does not know why these would not be filled for him. States that he does not see the point in coming in to our office just to get these medications since he will be having to pay a larger copay out to neurosurgeon as well. Please advise

## 2014-07-25 NOTE — Telephone Encounter (Signed)
Called and left a detailed message informing pt that we could not prescribe the prednisone due to elevated WBC count, and we did not want to suppress immune system. Routing the message to sharon just in case pt returns call.

## 2014-07-25 NOTE — Telephone Encounter (Signed)
Was in phone note. MRI comments copied here -- "Spoke with patient concerning results. Herniated disc noted at L4-L5 and L5-S1. Will refer to Neurosurgery for further assessment. MRI also reveals concern for myeloprolifertave disorder. Last CBC at 27. Patient sent to HemOnc but canceled appointment. Patient is now willing. Referral placed."  Patient was referred to Neurosurgery.  If he needs MRI results, it is ok to print the result for him to pick up to carry to Chiropractor.  Patient needs reassessment in clinic before further medication will be given.

## 2014-08-22 DIAGNOSIS — M5136 Other intervertebral disc degeneration, lumbar region: Secondary | ICD-10-CM | POA: Insufficient documentation

## 2015-01-09 ENCOUNTER — Other Ambulatory Visit: Payer: Self-pay | Admitting: Physician Assistant

## 2015-01-09 NOTE — Telephone Encounter (Signed)
Rx request to pharmacy/SLS Requested drug refills are authorized, however, the patient needs further evaluation and/or laboratory testing before further refills are given. Ask him to make an appointment for this.  

## 2015-04-17 ENCOUNTER — Encounter: Payer: Self-pay | Admitting: Physician Assistant

## 2015-04-17 ENCOUNTER — Ambulatory Visit (INDEPENDENT_AMBULATORY_CARE_PROVIDER_SITE_OTHER): Payer: BLUE CROSS/BLUE SHIELD | Admitting: Physician Assistant

## 2015-04-17 VITALS — BP 118/80 | HR 99 | Temp 98.7°F | Resp 16 | Ht 68.0 in | Wt 200.4 lb

## 2015-04-17 DIAGNOSIS — R002 Palpitations: Secondary | ICD-10-CM

## 2015-04-17 DIAGNOSIS — R1013 Epigastric pain: Secondary | ICD-10-CM | POA: Diagnosis not present

## 2015-04-17 DIAGNOSIS — R010 Benign and innocent cardiac murmurs: Secondary | ICD-10-CM | POA: Diagnosis not present

## 2015-04-17 DIAGNOSIS — J019 Acute sinusitis, unspecified: Secondary | ICD-10-CM | POA: Diagnosis not present

## 2015-04-17 DIAGNOSIS — R011 Cardiac murmur, unspecified: Secondary | ICD-10-CM

## 2015-04-17 DIAGNOSIS — R9431 Abnormal electrocardiogram [ECG] [EKG]: Secondary | ICD-10-CM

## 2015-04-17 DIAGNOSIS — C9110 Chronic lymphocytic leukemia of B-cell type not having achieved remission: Secondary | ICD-10-CM

## 2015-04-17 DIAGNOSIS — B9689 Other specified bacterial agents as the cause of diseases classified elsewhere: Secondary | ICD-10-CM

## 2015-04-17 DIAGNOSIS — G8929 Other chronic pain: Secondary | ICD-10-CM

## 2015-04-17 LAB — COMPREHENSIVE METABOLIC PANEL
ALT: 18 U/L (ref 0–53)
AST: 16 U/L (ref 0–37)
Albumin: 4.7 g/dL (ref 3.5–5.2)
Alkaline Phosphatase: 72 U/L (ref 39–117)
BUN: 14 mg/dL (ref 6–23)
CO2: 29 mEq/L (ref 19–32)
Calcium: 9.7 mg/dL (ref 8.4–10.5)
Chloride: 105 mEq/L (ref 96–112)
Creatinine, Ser: 1.11 mg/dL (ref 0.40–1.50)
GFR: 74.54 mL/min (ref >60.00)
Glucose, Bld: 93 mg/dL (ref 70–99)
Potassium: 3.9 mEq/L (ref 3.5–5.1)
Sodium: 141 mEq/L (ref 135–145)
Total Bilirubin: 0.6 mg/dL (ref 0.2–1.2)
Total Protein: 7.5 g/dL (ref 6.0–8.3)

## 2015-04-17 LAB — CBC WITH DIFFERENTIAL/PLATELET
Basophils Absolute: 0.1 10*3/uL (ref 0.0–0.1)
Basophils Relative: 0.2 % (ref 0.0–3.0)
Eosinophils Absolute: 0.1 10*3/uL (ref 0.0–0.7)
Eosinophils Relative: 0.4 % (ref 0.0–5.0)
HCT: 44 % (ref 39.0–52.0)
Hemoglobin: 14.8 g/dL (ref 13.0–17.0)
Lymphocytes Relative: 83.1 % — ABNORMAL HIGH (ref 12.0–46.0)
Lymphs Abs: 24.7 10*3/uL — ABNORMAL HIGH (ref 0.7–4.0)
MCHC: 33.5 g/dL (ref 30.0–36.0)
MCV: 93.8 fl (ref 78.0–100.0)
Monocytes Absolute: 1.1 10*3/uL — ABNORMAL HIGH (ref 0.1–1.0)
Monocytes Relative: 3.8 % (ref 3.0–12.0)
Neutro Abs: 3.7 10*3/uL (ref 1.4–7.7)
Neutrophils Relative %: 12.5 % — ABNORMAL LOW (ref 43.0–77.0)
Platelets: 115 10*3/uL — ABNORMAL LOW (ref 150.0–400.0)
RBC: 4.7 Mil/uL (ref 4.22–5.81)
RDW: 13.3 % (ref 11.5–15.5)
WBC: 29.8 10*3/uL (ref 4.0–10.5)

## 2015-04-17 LAB — LIPASE: Lipase: 28 U/L (ref 11.0–59.0)

## 2015-04-17 LAB — H. PYLORI ANTIBODY, IGG: H Pylori IgG: NEGATIVE

## 2015-04-17 MED ORDER — AMOXICILLIN-POT CLAVULANATE 875-125 MG PO TABS: 1.0000 | ORAL_TABLET | Freq: Two times a day (BID) | ORAL | Status: DC

## 2015-04-17 NOTE — Patient Instructions (Addendum)
Please go to the lab for blood work. I will call you with your results. Please follow-up with your Hematologist as directed. Stay hydrated and continue your aspirin therapy.  You will be contacted for assessment by Cardiology and for a stress test and echocardiogram.  IF chest pain occurs and you are having any SOB or lightheadedness, please call 911 or go to the ER.  We will alter regimen based on results.  Take your antibiotic as directed for sinus symptoms. Use saline nasal spray twice daily to keep sinuses flushed out.

## 2015-04-17 NOTE — Progress Notes (Signed)
Pre visit review using our clinic review tool, if applicable. No additional management support is needed unless otherwise documented below in the visit note/SLS  

## 2015-04-17 NOTE — Progress Notes (Signed)
Patient presents to clinic today c/o sinus pressure, sinus pain, nasal congestion and fatigue x 2 weeks. Endorses symptoms are gradually worsening.  Patient also wishes to discuss history of leukocytosis noted on previous lab work. Previously refused to see Hematology as recommended by this office. Endorses has now seen his old Hematologist, Dr Della Goo with [facility] but is unclear on discussions they have had.  Patient also complains of intermittent chest pain with mild SOB, occurring with exertion. BP well controlled at present. Denies hx of MI. Denies palpitations, LH or dizziness. Is taking a baby aspirin.  Past Medical History  Diagnosis Date  . GERD (gastroesophageal reflux disease)   . HTN (hypertension)   . Environmental allergies   . Asthma     childhood, resolved    Current Outpatient Prescriptions on File Prior to Visit  Medication Sig Dispense Refill  . acetaminophen (TYLENOL) 325 MG tablet Take 650 mg by mouth as needed.    Marland Kitchen aspirin 325 MG tablet Take 325 mg by mouth as needed.    . fexofenadine (ALLEGRA ALLERGY) 180 MG tablet Take 1 tablet (180 mg total) by mouth daily. (Patient taking differently: Take 180 mg by mouth daily as needed. ) 30 tablet 3  . lisinopril (PRINIVIL,ZESTRIL) 20 MG tablet TAKE 1 TABLET BY MOUTH DAILY 90 tablet 0  . Multiple Vitamin (MULTIVITAMIN) tablet Take 1 tablet by mouth daily.    Marland Kitchen omeprazole (PRILOSEC) 40 MG capsule TAKE ONE CAPSULE BY MOUTH DAILY 90 capsule 1  . pseudoephedrine (SUDAFED) 30 MG tablet Take 30 mg by mouth every 4 (four) hours as needed for congestion (Allergies).     No current facility-administered medications on file prior to visit.    No Known Allergies  Family History  Problem Relation Age of Onset  . Hypertension Mother     Living  . Hypertension Father 61    Deceased  . Hypertension Maternal Grandmother   . Hypertension Maternal Grandfather   . Healthy Sister   . Healthy Son     x1  . Healthy Daughter       x2  . Cancer Neg Hx   . Diabetes Neg Hx   . Heart disease Neg Hx     Social History   Social History  . Marital Status: Married    Spouse Name: N/A  . Number of Children: N/A  . Years of Education: N/A   Social History Main Topics  . Smoking status: Never Smoker   . Smokeless tobacco: Never Used  . Alcohol Use: 1.8 oz/week    3 Glasses of wine per week  . Drug Use: No  . Sexual Activity: Not Asked   Other Topics Concern  . None   Social History Narrative   Review of Systems - See HPI.  All other ROS are negative.  BP 118/80 mmHg  Pulse 99  Temp(Src) 98.7 F (37.1 C) (Oral)  Resp 16  Ht $R'5\' 8"'Hs$  (1.727 m)  Wt 200 lb 6 oz (90.89 kg)  BMI 30.47 kg/m2  SpO2 99%  Physical Exam  Constitutional: He is oriented to person, place, and time and well-developed, well-nourished, and in no distress.  HENT:  Head: Normocephalic and atraumatic.  Eyes: Conjunctivae are normal.  Neck: Neck supple.  Cardiovascular: Normal rate, regular rhythm and intact distal pulses.   Murmur heard.  Systolic murmur is present with a grade of 2/6  Pulses:      Carotid pulses are 2+ on the right side, and 2+ on  the left side. Pulmonary/Chest: Effort normal and breath sounds normal. No respiratory distress. He has no wheezes. He has no rales. He exhibits no tenderness.  Abdominal: Soft. Normal appearance. There is no hepatosplenomegaly. There is no tenderness. There is no CVA tenderness.  Neurological: He is alert and oriented to person, place, and time.  Skin: Skin is warm and dry. No rash noted. No erythema.  Psychiatric: Affect normal.  Vitals reviewed.   Recent Results (from the past 2160 hour(s))  Comp Met (CMET)     Status: None   Collection Time: 04/17/15 12:02 PM  Result Value Ref Range   Sodium 141 135 - 145 mEq/L   Potassium 3.9 3.5 - 5.1 mEq/L   Chloride 105 96 - 112 mEq/L   CO2 29 19 - 32 mEq/L   Glucose, Bld 93 70 - 99 mg/dL   BUN 14 6 - 23 mg/dL   Creatinine, Ser 1.11  0.40 - 1.50 mg/dL   Total Bilirubin 0.6 0.2 - 1.2 mg/dL   Alkaline Phosphatase 72 39 - 117 U/L   AST 16 0 - 37 U/L   ALT 18 0 - 53 U/L   Total Protein 7.5 6.0 - 8.3 g/dL   Albumin 4.7 3.5 - 5.2 g/dL   Calcium 9.7 8.4 - 10.5 mg/dL   GFR 74.54 >60.00 mL/min  Lipase     Status: None   Collection Time: 04/17/15 12:02 PM  Result Value Ref Range   Lipase 28.0 11.0 - 59.0 U/L  H. pylori antibody, IgG     Status: None   Collection Time: 04/17/15 12:02 PM  Result Value Ref Range   H Pylori IgG Negative Negative  CBC w/Diff     Status: Abnormal   Collection Time: 04/17/15 12:02 PM  Result Value Ref Range   WBC 29.8 Repeated and verified X2. (HH) 4.0 - 10.5 K/uL   RBC 4.70 4.22 - 5.81 Mil/uL   Hemoglobin 14.8 13.0 - 17.0 g/dL   HCT 44.0 39.0 - 52.0 %   MCV 93.8 78.0 - 100.0 fl   MCHC 33.5 30.0 - 36.0 g/dL   RDW 13.3 11.5 - 15.5 %   Platelets 115.0 (L) 150.0 - 400.0 K/uL   Neutrophils Relative % 12.5 (L) 43.0 - 77.0 %   Lymphocytes Relative 83.1 Repeated and verified X2. (H) 12.0 - 46.0 %   Monocytes Relative 3.8 3.0 - 12.0 %   Eosinophils Relative 0.4 0.0 - 5.0 %   Basophils Relative 0.2 0.0 - 3.0 %   Neutro Abs 3.7 1.4 - 7.7 K/uL   Lymphs Abs 24.7 (H) 0.7 - 4.0 K/uL   Monocytes Absolute 1.1 (H) 0.1 - 1.0 K/uL   Eosinophils Absolute 0.1 0.0 - 0.7 K/uL   Basophils Absolute 0.1 0.0 - 0.1 K/uL    Assessment/Plan: Undiagnosed cardiac murmurs New onset with intermittent chest pain. EKG NSR today but with signs of prior MI and potential anterolateral ischemia. Urgent referral to Cardiology placed. Will check Echocardiogram and EST. ASA daily. Continue BP management. Alarms signs/symptoms discussed prompting 911 call.  CLL (chronic lymphocytic leukemia) Care Everywhere reviewed. It seems patient has diagnosis of CLL but did not completely understand illness and treatment protocols. Reviewed in some detail with patient but instructed patient it is in his best interest to discuss further  with Heme/Onc. Will repeat CBC today.  Abnormal finding on EKG New onset murmur with intermittent chest pain. EKG NSR today but with signs of prior MI and potential anterolateral ischemia. Urgent referral  to Cardiology placed. Will check Echocardiogram and EST. ASA daily. Continue BP management. Alarms signs/symptoms discussed prompting 911 call.   Abdominal pain, chronic, epigastric Lab panel ordered. Giving EKG findings and murmur, concerns epigastric pain is actually radiating from chest. See A/P for cardiac findings.  Acute bacterial sinusitis Rx Augmentin.  Increase fluids.  Rest.  Saline nasal spray.  Probiotic.  Mucinex as directed.  Humidifier in bedroom. .  Call or return to clinic if symptoms are not improving.   I spent > 40 minutes with patients discussing EKG findings, reviewing workup in detail, reviewing Oncology notes in Care everywhere and educating patient on his CLL.

## 2015-04-18 ENCOUNTER — Telehealth: Payer: Self-pay | Admitting: *Deleted

## 2015-04-18 DIAGNOSIS — G8929 Other chronic pain: Secondary | ICD-10-CM

## 2015-04-18 DIAGNOSIS — R1013 Epigastric pain: Principal | ICD-10-CM

## 2015-04-18 DIAGNOSIS — Z8719 Personal history of other diseases of the digestive system: Secondary | ICD-10-CM

## 2015-04-18 NOTE — Telephone Encounter (Signed)
Received call from [facility] with a critical lab.  WBC:29.8 (H).  The report was given to [facility] verbally.//AB/CMA

## 2015-04-18 NOTE — Telephone Encounter (Signed)
Called and spoke with the pt and informed him of recent lab results and note.  Pt verbalized understanding.  Pt stated that he was called on yesterday with his Cardiology appt.  Pt wanted to know if about the referral to the GI to have an Endo done.  Please advise.//AB/CMA

## 2015-04-18 NOTE — Telephone Encounter (Signed)
Referral has been placed. 

## 2015-04-18 NOTE — Telephone Encounter (Signed)
-----   Message from Brunetta Jeans, PA-C sent at 04/17/2015  4:32 PM EDT ----- Labs unremarkable overall. WBC count at 29, still below threshold of concern from her Hematologist. Continue care discussed at visit today. Call if sinus symptoms not resolving. Again he will be contacted by Cardiology.

## 2015-04-19 ENCOUNTER — Encounter: Payer: Self-pay | Admitting: Gastroenterology

## 2015-04-21 DIAGNOSIS — R9431 Abnormal electrocardiogram [ECG] [EKG]: Secondary | ICD-10-CM | POA: Insufficient documentation

## 2015-04-21 DIAGNOSIS — C9110 Chronic lymphocytic leukemia of B-cell type not having achieved remission: Secondary | ICD-10-CM | POA: Insufficient documentation

## 2015-04-21 DIAGNOSIS — R011 Cardiac murmur, unspecified: Secondary | ICD-10-CM | POA: Insufficient documentation

## 2015-04-21 NOTE — Assessment & Plan Note (Signed)
Rx Augmentin.  Increase fluids.  Rest.  Saline nasal spray.  Probiotic.  Mucinex as directed.  Humidifier in bedroom.  Call or return to clinic if symptoms are not improving.  

## 2015-04-21 NOTE — Assessment & Plan Note (Signed)
Lab panel ordered. Giving EKG findings and murmur, concerns epigastric pain is actually radiating from chest. See A/P for cardiac findings.

## 2015-04-21 NOTE — Assessment & Plan Note (Signed)
Care Everywhere reviewed. It seems patient has diagnosis of CLL but did not completely understand illness and treatment protocols. Reviewed in some detail with patient but instructed patient it is in his best interest to discuss further with Heme/Onc. Will repeat CBC today.

## 2015-04-21 NOTE — Assessment & Plan Note (Signed)
New onset with intermittent chest pain. EKG NSR today but with signs of prior MI and potential anterolateral ischemia. Urgent referral to Cardiology placed. Will check Echocardiogram and EST. ASA daily. Continue BP management. Alarms signs/symptoms discussed prompting 911 call.

## 2015-04-21 NOTE — Assessment & Plan Note (Signed)
New onset murmur with intermittent chest pain. EKG NSR today but with signs of prior MI and potential anterolateral ischemia. Urgent referral to Cardiology placed. Will check Echocardiogram and EST. ASA daily. Continue BP management. Alarms signs/symptoms discussed prompting 911 call.

## 2015-04-23 ENCOUNTER — Telehealth: Payer: Self-pay | Admitting: Physician Assistant

## 2015-04-23 NOTE — Telephone Encounter (Signed)
Relation to PY:KDXI Call back [phone]   Reason for call:  Patient states has a sleep study Wednesday 04/25/2015 patient is concerned because he currently on amoxicillin-clavulanate (AUGMENTIN) 875-125 MG per tablet. Patient feels study will not be accurate please advise.

## 2015-04-23 NOTE — Telephone Encounter (Signed)
Spoke to patient regarding concerns -- has STRESS TEST scheduled not a sleep study. Symptoms improving but wanted to make sure ok to have stress test. Reassured ok to have as long as feeling ok day of, but to call the EST number and discuss with their RNs as well.

## 2015-04-23 NOTE — Telephone Encounter (Signed)
Antibiotic will not affect his sleep study. Continue taking as directed.

## 2015-04-24 ENCOUNTER — Other Ambulatory Visit: Payer: Self-pay | Admitting: Physician Assistant

## 2015-04-24 NOTE — Telephone Encounter (Signed)
Medication Detail      Disp Refills Start End     omeprazole (PRILOSEC) 40 MG capsule 90 capsule 1 05/12/2014     Sig: TAKE ONE CAPSULE BY MOUTH DAILY    E-Prescribing Status: Receipt confirmed by pharmacy (05/12/2014 3:09 PM EDT)     Pharmacy    [id number] - [place], [place] - [address] AT [place]   Refill sent per [facility] refill protocol/SLS

## 2015-04-25 ENCOUNTER — Telehealth: Payer: Self-pay | Admitting: Physician Assistant

## 2015-04-25 ENCOUNTER — Other Ambulatory Visit: Payer: Self-pay

## 2015-04-25 ENCOUNTER — Ambulatory Visit (HOSPITAL_COMMUNITY): Payer: BLUE CROSS/BLUE SHIELD | Attending: Cardiology

## 2015-04-25 DIAGNOSIS — R079 Chest pain, unspecified: Secondary | ICD-10-CM | POA: Diagnosis present

## 2015-04-25 DIAGNOSIS — R002 Palpitations: Secondary | ICD-10-CM | POA: Diagnosis not present

## 2015-04-25 DIAGNOSIS — R010 Benign and innocent cardiac murmurs: Secondary | ICD-10-CM

## 2015-04-25 DIAGNOSIS — I517 Cardiomegaly: Secondary | ICD-10-CM | POA: Insufficient documentation

## 2015-04-25 DIAGNOSIS — I10 Essential (primary) hypertension: Secondary | ICD-10-CM | POA: Diagnosis not present

## 2015-04-25 DIAGNOSIS — I071 Rheumatic tricuspid insufficiency: Secondary | ICD-10-CM | POA: Insufficient documentation

## 2015-04-25 DIAGNOSIS — R011 Cardiac murmur, unspecified: Secondary | ICD-10-CM

## 2015-04-25 DIAGNOSIS — I350 Nonrheumatic aortic (valve) stenosis: Secondary | ICD-10-CM | POA: Diagnosis not present

## 2015-04-25 MED ORDER — LISINOPRIL 20 MG PO TABS: 20.0000 mg | ORAL_TABLET | Freq: Every day | ORAL | Status: DC

## 2015-04-25 NOTE — Telephone Encounter (Signed)
Pharmacy: [facility] on [address]  Reason for call: Pt has 2 pills left of lisinopril. Please send with refills as was discussed.   Pt completed Echo today and they told him results take several weeks. This was concerning to him and he is asking if this is correct if he can be notified.

## 2015-04-25 NOTE — Telephone Encounter (Signed)
Rx to pharmacy, Patient informed, understood & told that he should hear from [place] on Echo results w/i the next 2 days/SLS/

## 2015-05-09 ENCOUNTER — Other Ambulatory Visit: Payer: Self-pay | Admitting: Physician Assistant

## 2015-05-09 ENCOUNTER — Encounter: Payer: Self-pay | Admitting: Cardiology

## 2015-05-09 ENCOUNTER — Ambulatory Visit (INDEPENDENT_AMBULATORY_CARE_PROVIDER_SITE_OTHER): Payer: BLUE CROSS/BLUE SHIELD | Admitting: Cardiology

## 2015-05-09 VITALS — BP 136/90 | HR 80 | Ht 69.0 in | Wt 206.0 lb

## 2015-05-09 DIAGNOSIS — R079 Chest pain, unspecified: Secondary | ICD-10-CM | POA: Insufficient documentation

## 2015-05-09 DIAGNOSIS — R002 Palpitations: Secondary | ICD-10-CM

## 2015-05-09 DIAGNOSIS — I350 Nonrheumatic aortic (valve) stenosis: Secondary | ICD-10-CM

## 2015-05-09 DIAGNOSIS — I10 Essential (primary) hypertension: Secondary | ICD-10-CM

## 2015-05-09 DIAGNOSIS — R072 Precordial pain: Secondary | ICD-10-CM | POA: Diagnosis not present

## 2015-05-09 MED ORDER — AZITHROMYCIN 250 MG PO TABS: ORAL_TABLET | ORAL | Status: DC

## 2015-05-09 NOTE — Patient Instructions (Signed)
Medication Instructions:   Your physician recommends that you continue on your current medications as directed. Please refer to the Current Medication list given to you today.     Testing/Procedures:  Your physician has requested that you have en exercise stress myoview. For further information please visit [email or web address]. Please follow instruction sheet, as given.   Your physician has recommended that you wear a 24 HR holter monitor. Holter monitors are medical devices that record the heart's electrical activity. Doctors most often use these monitors to diagnose arrhythmias. Arrhythmias are problems with the speed or rhythm of the heartbeat. The monitor is a small, portable device. You can wear one while you do your normal daily activities. This is usually used to diagnose what is causing palpitations/syncope (passing out).    Follow-Up:  4-6 WEEKS WITH DR Meda Coffee OR AT HER VERY NEXT AVAILABLE NEAR THAT TIME

## 2015-05-09 NOTE — Telephone Encounter (Signed)
Sent in to his local [facility] after speaking to him.  He has not left yet and can pickup at his regular local pharmacy.

## 2015-05-09 NOTE — Telephone Encounter (Signed)
Pt said that he is starting to have the same symptoms that he had before 04/17/15. He said that he finished the antibiotics and within a couple days started feeling badly again. Pt is requesting a call back to see if he can get a refill on amoxicillin-clavulanate (AUGMENTIN) 875-125 MG per tablet. He said he will be going out of town for work today and not able to come for an appt. Please call [phone].

## 2015-05-09 NOTE — Telephone Encounter (Signed)
Called left message to call back 

## 2015-05-09 NOTE — Telephone Encounter (Signed)
Will agree to send in antibiotic. Will send in Azithromycin instead of Augmentin (medication pending above). Take as directed. If symptoms are not resolving, needs OV. Please find out what pharmacy he wishes to use today.

## 2015-05-09 NOTE — Progress Notes (Signed)
Patient ID: Todd Novak, male   DOB: 27-Mar-1966, 49 y.o.   MRN: [id number]      Cardiology Office Note   Date:  05/09/2015   ID:  Todd Novak, DOB 12/07/1965, MRN [id number]  PCP:  Leeanne Rio, PA-C  Cardiologist:   Dorothy Spark, MD   Chief complain; CP, DOE   History of Present Illness: Todd Novak is a 49 y.o. male  Patient presents to clinic today c/o sinus pressure, not related to activity, but associated with dizziness, no syncope. He also describes sudden onset palpitations that started about 2 months ago, worsening in frequency and length, now every day, can last up to an hour, associated with SOB and dizziness. No claudications, orthopnea, PND. No FH of premature CAD. He exercises on a regular basis.   Past Medical History  Diagnosis Date  . GERD (gastroesophageal reflux disease)   . HTN (hypertension)   . Environmental allergies   . Asthma     childhood, resolved   Past Surgical History  Procedure Laterality Date  . Appendectomy    . Wisdom tooth extraction    . Cholecystectomy     Current Outpatient Prescriptions  Medication Sig Dispense Refill  . acetaminophen (TYLENOL) 325 MG tablet Take 650 mg by mouth as needed.    . Ascorbic Acid (VITAMIN C) 100 MG tablet Take 100 mg by mouth daily.    Marland Kitchen aspirin 325 MG tablet Take 325 mg by mouth as needed.    . fexofenadine (ALLEGRA ALLERGY) 180 MG tablet Take 1 tablet (180 mg total) by mouth daily. (Patient taking differently: Take 180 mg by mouth daily as needed. ) 30 tablet 3  . lisinopril (PRINIVIL,ZESTRIL) 20 MG tablet Take 1 tablet (20 mg total) by mouth daily. 90 tablet 0  . Multiple Vitamin (MULTIVITAMIN) tablet Take 1 tablet by mouth daily.    Marland Kitchen omeprazole (PRILOSEC) 40 MG capsule TAKE 1 CAPSULE BY MOUTH DAILY 90 capsule 0  . pseudoephedrine (SUDAFED) 30 MG tablet Take 30 mg by mouth every 4 (four) hours as needed for congestion (Allergies).    . vitamin E 400 UNIT capsule Take 400 Units by  mouth daily.     No current facility-administered medications for this visit.   Allergies:   Methocarbamol and Tramadol   Social History:  The patient  reports that he has never smoked. He has never used smokeless tobacco. He reports that he drinks about 1.8 oz of alcohol per week. He reports that he does not use illicit drugs.   Family History:  The patient's family history includes Healthy in his daughter, sister, and son; Hypertension in his maternal grandfather, maternal grandmother, and mother; Hypertension (age of onset: 16) in his father. There is no history of Cancer, Diabetes, or Heart disease.   ROS:  Please see the history of present illness.   Otherwise, review of systems are positive for none.   All other systems are reviewed and negative.   PHYSICAL EXAM: VS:  There were no vitals taken for this visit. , BMI There is no weight on file to calculate BMI. GEN: Well nourished, well developed, in no acute distress HEENT: normal Neck: no JVD, carotid bruits, or masses Cardiac: RRR; no murmurs, rubs, or gallops,no edema  Respiratory:  clear to auscultation bilaterally, normal work of breathing GI: soft, nontender, nondistended, + BS MS: no deformity or atrophy Skin: warm and dry, no rash Neuro:  Strength and sensation are intact Psych: euthymic mood, full affect  Recent  Labs: 04/17/2015: ALT 18; BUN 14; Creatinine, Ser 1.11; Hemoglobin 14.8; Platelets 115.0*; Potassium 3.9; Sodium 141   Lipid Panel No results found for: CHOL, TRIG, HDL, CHOLHDL, VLDL, LDLCALC, LDLDIRECT   Wt Readings from Last 3 Encounters:  04/17/15 200 lb 6 oz (90.89 kg)  06/21/14 196 lb (88.905 kg)  05/12/14 194 lb 8 oz (88.225 kg)    TTE:  04/25/2015  - Left ventricle: The cavity size was normal. There was moderate concentric hypertrophy. Systolic function was vigorous. The estimated ejection fraction was in the range of 65% to 70%. Wall motion was normal; there were no regional wall  motion abnormalities. Doppler parameters are consistent with abnormal left ventricular relaxation (grade 1 diastolic dysfunction). - Aortic valve: There was mild stenosis. Mean gradient (S): 10 mm Hg. Peak gradient (S): 17 mm Hg. Valve area (VTI): 3.73 cm^2. Valve area (Vmax): 3.8 cm^2. Valve area (Vmean): 4.07 cm^2. - Left atrium: The atrium was mildly dilated. - Right ventricle: The cavity size was normal. Wall thickness was normal. Systolic function was normal. - Right atrium: The atrium was normal in size. - Tricuspid valve: There was trivial regurgitation. - Pulmonic valve: There was no regurgitation. - Pulmonary arteries: Systolic pressure was within the normal range. - Inferior vena cava: The vessel was normal in size. The respirophasic diameter changes were in the normal range (= 50%), consistent with normal central venous pressure. - Pericardium, extracardiac: There was no pericardial effusion.  EKG:  SR, LVH with strain, RVH, possible prior posterior MI, possible lateral ischemia   ASSESSMENT AND PLAN:  1.  Chest pain - with some typical and atypical features, however highly abnormal ECG, we will schedule an exercise nuclear stress test  2. Palpitations - schedule a 24 hour Holter monitor  3. Hypertension - elevated, also moderate LVH, adjustment of lisinopril after the stress test  Follow up in 6 weeks.  Signed, Dorothy Spark, MD  05/09/2015 9:28 AM    [address], [place], [place]  [address] Phone: [phone]; Fax: [phone]

## 2015-05-17 ENCOUNTER — Telehealth (HOSPITAL_COMMUNITY): Payer: Self-pay | Admitting: *Deleted

## 2015-05-17 ENCOUNTER — Ambulatory Visit: Payer: BLUE CROSS/BLUE SHIELD | Admitting: Gastroenterology

## 2015-05-17 NOTE — Telephone Encounter (Signed)
Left message on voicemail in reference to upcoming appointment scheduled for 05/22/15. Phone number given for a call back so details instructions can be given. Hubbard Robinson, RN

## 2015-05-18 ENCOUNTER — Encounter: Payer: Self-pay | Admitting: Gastroenterology

## 2015-05-21 ENCOUNTER — Telehealth (HOSPITAL_COMMUNITY): Payer: Self-pay

## 2015-05-21 NOTE — Telephone Encounter (Signed)
Patient given detailed instructions per Myocardial Perfusion Study Information Sheet for the test on 05-22-2015 at 1000. Patient notified to arrive 15 minutes early and that it is imperative to arrive on time for appointment to keep from having the test rescheduled.  If you need to cancel or reschedule your appointment, please call the office within 24 hours of your appointment. Failure to do so may result in a cancellation of your appointment, and a $50 no show fee. Patient verbalized understanding.Oletta Lamas, Shala Baumbach A

## 2015-05-22 ENCOUNTER — Ambulatory Visit (HOSPITAL_COMMUNITY): Payer: BLUE CROSS/BLUE SHIELD | Attending: Internal Medicine

## 2015-05-22 ENCOUNTER — Ambulatory Visit (INDEPENDENT_AMBULATORY_CARE_PROVIDER_SITE_OTHER): Payer: BLUE CROSS/BLUE SHIELD

## 2015-05-22 DIAGNOSIS — R42 Dizziness and giddiness: Secondary | ICD-10-CM | POA: Diagnosis not present

## 2015-05-22 DIAGNOSIS — R072 Precordial pain: Secondary | ICD-10-CM

## 2015-05-22 DIAGNOSIS — I10 Essential (primary) hypertension: Secondary | ICD-10-CM

## 2015-05-22 DIAGNOSIS — I350 Nonrheumatic aortic (valve) stenosis: Secondary | ICD-10-CM

## 2015-05-22 DIAGNOSIS — R002 Palpitations: Secondary | ICD-10-CM | POA: Diagnosis not present

## 2015-05-22 LAB — MYOCARDIAL PERFUSION IMAGING
Estimated workload: 10.1 METS
Exercise duration (min): 9 min
Exercise duration (sec): 0 s
LV dias vol: 88 mL
LV sys vol: 32 mL
MPHR: 157 {beats}/min
Peak HR: 157 {beats}/min
Percent HR: 92 %
RATE: 0.32
Rest HR: 86 {beats}/min
SDS: 2
SRS: 5
SSS: 7
TID: 0.96

## 2015-05-22 MED ORDER — TECHNETIUM TC 99M SESTAMIBI GENERIC - CARDIOLITE
10.8000 | Freq: Once | INTRAVENOUS | Status: AC | PRN
  Administered 2015-05-22: 11 via INTRAVENOUS

## 2015-05-22 MED ORDER — TECHNETIUM TC 99M SESTAMIBI GENERIC - CARDIOLITE
31.5000 | Freq: Once | INTRAVENOUS | Status: AC | PRN
  Administered 2015-05-22: 32 via INTRAVENOUS

## 2015-05-23 ENCOUNTER — Telehealth: Payer: Self-pay | Admitting: Cardiology

## 2015-05-23 NOTE — Telephone Encounter (Signed)
Notified the pt that per Dr Meda Coffee his stress test was normal.  Pt verbalized understanding.

## 2015-05-23 NOTE — Telephone Encounter (Signed)
Follow Up  Pt called states that he is returning your. He was advised to call first thing. Please assist

## 2015-05-25 ENCOUNTER — Encounter: Payer: Self-pay | Admitting: Physician Assistant

## 2015-05-25 ENCOUNTER — Ambulatory Visit (INDEPENDENT_AMBULATORY_CARE_PROVIDER_SITE_OTHER): Payer: BLUE CROSS/BLUE SHIELD | Admitting: Physician Assistant

## 2015-05-25 ENCOUNTER — Other Ambulatory Visit (INDEPENDENT_AMBULATORY_CARE_PROVIDER_SITE_OTHER): Payer: BLUE CROSS/BLUE SHIELD

## 2015-05-25 VITALS — BP 108/78 | HR 80 | Ht 69.0 in | Wt 202.4 lb

## 2015-05-25 DIAGNOSIS — R1013 Epigastric pain: Secondary | ICD-10-CM

## 2015-05-25 DIAGNOSIS — R1310 Dysphagia, unspecified: Secondary | ICD-10-CM

## 2015-05-25 DIAGNOSIS — R112 Nausea with vomiting, unspecified: Secondary | ICD-10-CM | POA: Diagnosis not present

## 2015-05-25 LAB — COMPREHENSIVE METABOLIC PANEL
ALT: 31 U/L (ref 0–53)
AST: 24 U/L (ref 0–37)
Albumin: 4.6 g/dL (ref 3.5–5.2)
Alkaline Phosphatase: 65 U/L (ref 39–117)
BUN: 16 mg/dL (ref 6–23)
CO2: 27 mEq/L (ref 19–32)
Calcium: 9.4 mg/dL (ref 8.4–10.5)
Chloride: 103 mEq/L (ref 96–112)
Creatinine, Ser: 1.11 mg/dL (ref 0.40–1.50)
GFR: 74.51 mL/min (ref >60.00)
Glucose, Bld: 91 mg/dL (ref 70–99)
Potassium: 4.1 mEq/L (ref 3.5–5.1)
Sodium: 138 mEq/L (ref 135–145)
Total Bilirubin: 0.9 mg/dL (ref 0.2–1.2)
Total Protein: 7.1 g/dL (ref 6.0–8.3)

## 2015-05-25 LAB — AMYLASE: Amylase: 46 U/L (ref 27–131)

## 2015-05-25 LAB — LIPASE: Lipase: 28 U/L (ref 11.0–59.0)

## 2015-05-25 MED ORDER — PANTOPRAZOLE SODIUM 40 MG PO TBEC: 40.0000 mg | DELAYED_RELEASE_TABLET | Freq: Two times a day (BID) | ORAL | Status: DC

## 2015-05-25 NOTE — Progress Notes (Signed)
Reviewed and agree with documentation and assessment and plan. K. Veena Aarion Metzgar , MD   

## 2015-05-25 NOTE — Patient Instructions (Addendum)
You have been scheduled for a Barium Esophogram  Radiology (1st floor of the hospital) on 05-31-15 at 10:30am. Please arrive 15 minutes prior to your appointment for registration. Make certain not to have anything to eat or drink 6 hours prior to your test. If you need to reschedule for any reason, please contact radiology at [phone] to do so. __________________________________________________________________ A barium swallow is an examination that concentrates on views of the esophagus. This tends to be a double contrast exam (barium and two liquids which, when combined, create a gas to distend the wall of the oesophagus) or single contrast (non-ionic iodine based). The study is usually tailored to your symptoms so a good history is essential. Attention is paid during the study to the form, structure and configuration of the esophagus, looking for functional disorders (such as aspiration, dysphagia, achalasia, motility and reflux) EXAMINATION You may be asked to change into a gown, depending on the type of swallow being performed. A radiologist and radiographer will perform the procedure. The radiologist will advise you of the type of contrast selected for your procedure and direct you during the exam. You will be asked to stand, sit or lie in several different positions and to hold a small amount of fluid in your mouth before being asked to swallow while the imaging is performed .In some instances you may be asked to swallow barium coated marshmallows to assess the motility of a solid food bolus. The exam can be recorded as a digital or video fluoroscopy procedure. POST PROCEDURE It will take 1-2 days for the barium to pass through your system. To facilitate this, it is important, unless otherwise directed, to increase your fluids for the next 24-48hrs and to resume your normal diet.  This test typically takes about 30 minutes to  perform. __________________________________________________________________________________   Todd Novak have been scheduled for an abdominal ultrasound at [facility] Radiology (1st floor of hospital) on 05-31-15 at 11:00am. Please arrive 15 minutes prior to your appointment for registration. Make certain not to have anything to eat or drink 6 hours prior to your appointment. Should you need to reschedule your appointment, please contact radiology at [phone]. This test typically takes about 30 minutes to perform.   We have sent the following medications to your pharmacy for you to pick up at your convenience: Pantoprazole  Please stop taking omeprazole  You have been scheduled for an endoscopy. Please follow written instructions given to you at your visit today. If you use inhalers (even only as needed), please bring them with you on the day of your procedure.  Your physician has requested that you go to the basement for the following lab work before leaving today: CMET, Lipase, Amylase

## 2015-05-25 NOTE — Progress Notes (Addendum)
Patient ID: Todd Novak, male   DOB: [birth year], 49 y.o.   MRN: [id number]    HPI:  Todd Novak is a 49 y.o.   male  referred by Brunetta Jeans, PA-C for evaluation of epigastric pain, chest pain, and dysphagia. Taro has a history of GERD, hypertension, environmental allergies, asthma, and CLL, he states that about 10 years ago he had epigastric pain and was diagnosed with reflux. He subsequently had an upper endoscopy in [place] ([place] he is not sure what city) and was told he had gastric polyps. Since that time, he frequently gets postprandial epigastric pain associated with nausea. Over the past 6 months it has gotten significantly worse. Due to his postprandial pain, he had a cholecystectomy 4 years ago at [facility]. He states his cholecystectomy made absolutely no difference in his pain. His pain sometimes doubles him over. Recently when he has had the pain he feels as if he is having a heart attack. He recently underwent a cardiac workup with stress test and myocardial perfusion all of which were normal. He also reports that he has been having difficulty swallowing solids foods for one to one and a half years, which has been getting much more severe over the past 6 months. He has had numerous episodes where he has had to spit food up because it will not go down. He has not noticed any difficulty swallowing liquids. He reports that he feels full after small portions of food and states "half the sample which will make me feel like I ate 3 chickens". When he lies down at night, he sometimes awakens in the middle of the night with a burning pain in his chest. He has been on omeprazole 40 mg by mouth every morning for years, and recently increased it to twice a day with no relief. He reports that ingestion of alcohol or caffeine significantly increases his epigastric pain and chest pain. He has no associated palpitations or shortness of breath. His bowel movements have been regular and  he's had no change in his bowel habits or stool caliber. He has had no bright red blood per rectum or melena. He denies a family history of colon cancer, colon polyps, or inflammatory bowel disease, but states he had a colonoscopy for 5 years ago at [facility] before he had his cholecystectomy. He states it was normal.     Past Medical History  Diagnosis Date  . GERD (gastroesophageal reflux disease)   . HTN (hypertension)   . Environmental allergies   . Asthma     childhood, resolved  . Heart murmur   . Fatigue   . CLL (chronic lymphocytic leukemia) ([place])     Past Surgical History  Procedure Laterality Date  . Appendectomy    . Wisdom tooth extraction    . Cholecystectomy     Family History  Problem Relation Age of Onset  . Hypertension Mother     Living  . Hypertension Father 65    Deceased  . Hypertension Maternal Grandmother   . Hypertension Maternal Grandfather   . Healthy Sister   . Healthy Son     x1  . Healthy Daughter     x2  . Cancer Neg Hx   . Diabetes Neg Hx   . Heart disease Neg Hx    Social History  Substance Use Topics  . Smoking status: Never Smoker   . Smokeless tobacco: Never Used  . Alcohol Use: 1.8 oz/week  3 Glasses of wine per week     Comment: Occ   Current Outpatient Prescriptions  Medication Sig Dispense Refill  . acetaminophen (TYLENOL) 325 MG tablet Take 650 mg by mouth as needed.    . Ascorbic Acid (VITAMIN C) 100 MG tablet Take 100 mg by mouth daily.    Marland Kitchen aspirin 81 MG tablet Take 81 mg by mouth daily.    Marland Kitchen azithromycin (ZITHROMAX) 250 MG tablet Take 2 tablets on Day 1. Then take 1 tablet daily. 6 each 0  . fexofenadine (ALLEGRA) 180 MG tablet Take 180 mg by mouth as needed for allergies or rhinitis (seasonal).    . fluticasone (FLONASE) 50 MCG/ACT nasal spray Place into the nose. 2 sprays into each nostril daily as needed for seasonal allergies    . lisinopril (PRINIVIL,ZESTRIL) 20 MG tablet Take 1 tablet (20 mg total)  by mouth daily. 90 tablet 0  . Multiple Vitamin (MULTIVITAMIN) tablet Take 1 tablet by mouth daily.    . pseudoephedrine (SUDAFED) 30 MG tablet Take 30 mg by mouth every 4 (four) hours as needed for congestion (Allergies).    . vitamin E 400 UNIT capsule Take 400 Units by mouth daily.    . pantoprazole (PROTONIX) 40 MG tablet Take 1 tablet (40 mg total) by mouth 2 (two) times daily. 60 tablet 3   No current facility-administered medications for this visit.   Allergies  Allergen Reactions  . Methocarbamol Nausea Only    dizziness  . Tramadol Nausea And Vomiting     Review of Systems: Gen: Denies any fever, chills, sweats, anorexia, fatigue, weakness, malaise, weight loss, and sleep disorder CV: Denies chest pain, angina, palpitations, syncope, orthopnea, PND, peripheral edema, and claudication. Resp: Denies dyspnea at rest, dyspnea with exercise, cough, sputum, wheezing, coughing up blood, and pleurisy. GI: Denies vomiting blood, jaundice, and fecal incontinence.  Has dysphagia to solids. GU : Denies urinary burning, blood in urine, urinary frequency, urinary hesitancy, nocturnal urination, and urinary incontinence. MS: Denies joint pain, limitation of movement, and swelling, stiffness, low back pain, extremity pain. Denies muscle weakness, cramps, atrophy.  Derm: Denies rash, itching, dry skin, hives, moles, warts, or unhealing ulcers.  Psych: Denies depression, anxiety, memory loss, suicidal ideation, hallucinations, paranoia, and confusion. Heme: Denies bruising, bleeding, and enlarged lymph nodes. Neuro:  Denies any headaches, dizziness, paresthesias. Endo:  Denies any problems with DM, thyroid, adrenal function  Studies: Echocardiogram 04/25/2015 showed normal systolic function. Diastolic function slightly decreased. Mild aortic stenosis.EF 65-70%. Myocardial perfusion:Myocardial perfusion is normal. The study is normal. This is a low risk study. Overall left ventricular systolic  function was normal. LV cavity size is normal. Nuclear stress EF: 64%.  Physical Exam: BP 108/78 mmHg  Pulse 80  Ht 5\' 9"  (1.753 m)  Wt 202 lb 6 oz (91.797 kg)  BMI 29.87 kg/m2 Constitutional: Pleasant,well-developed, Caucasian male in no acute distress. HEENT: Normocephalic and atraumatic. Conjunctivae are normal. No scleral icterus. Neck supple. No JVD Cardiovascular: Normal rate, regular rhythm.  Pulmonary/chest: Effort normal and breath sounds normal. No wheezing, rales or rhonchi. Abdominal: Soft, nondistended, nontender. Bowel sounds active throughout. There are no masses palpable. No hepatomegaly. Extremities: no edema Lymphadenopathy: No cervical adenopathy noted. Neurological: Alert and oriented to person place and time. Skin: Skin is warm and dry. No rashes noted. Psychiatric: Normal mood and affect. Behavior is normal.  ASSESSMENT AND PLAN: 49 year old male with a long-standing history of GERD presenting with symptoms of epigastric pain, nausea, dysphagia, and chest pain. Symptoms  may all be due to poorly controlled reflux. An antireflux regimen has been reviewed at length with the patient. He's been instructed to avoid eating or drinking for 2 hours before retiring in the evening. He will discontinue omeprazole and be given a trial of pantoprazole 40 mg by mouth 30 minutes before breakfast and 30 minutes before supper. A CBC, comprehensive metabolic panel, and lipase will be obtained. An abdominal ultrasound will be obtained to evaluate for any ductal dilatation. A barium swallow with tablet will be obtained to evaluate for any possible stricture, dysmotility etc. He will be scheduled for an EGD to evaluate for esophagitis, gastritis, ulcer, duodenitis, etc.The risks, benefits, and alternatives to endoscopy with possible biopsy and possible dilation were discussed with the patient and they consent to proceed. The procedure will be scheduled with Dr. Silverio Decamp. It was explained to the  patient that he may calm to be a candidate for a gastric emptying scan, and possibly esophageal manometry/pH with impedance. The patient has signed a medical release to obtain a copy of his operative report from his cholecystectomy and the report of his colonoscopy from [facility]. The patient has been asked to call us in 2 weeks to let us know if the pantoprazole is helping at all. If not, he may be considered for the addition of low-dose metoclopramide. Further recommendations will be made pending the findings of the above.    Aloysious Vangieson, Vita Barley PA-C 05/25/2015, 3:06 PM  CC: Brunetta Jeans, PA-C   Addendum 05/28/2015:  Received medical records from [facility]. Patient had a colonoscopy and EGD on 05/14/2010 by Dr. Redmond Pulling. Colonoscopy had changes consistent with ischemic colitis of left colon around 60 cm to around 30 cm, internal hemorrhoids, some erythema in the distal edge of the ileocecal valve. EGD noted approximately 15 small polyps in the body of the stomach. Biopsies were done.  Pathology: Left colon biopsy showed mucosal edema, superficial erosions and an focal pseudomembrane formation consistent with early ischemic colitis. Ileocecal valve biopsies had mild edema with benign lymphoid nodules. Stomach polyp biopsy had multiple benign fundic gland polyps. Biopsies were negative for H. pylori.

## 2015-05-30 ENCOUNTER — Telehealth: Payer: Self-pay | Admitting: Physician Assistant

## 2015-05-30 DIAGNOSIS — R109 Unspecified abdominal pain: Secondary | ICD-10-CM

## 2015-05-30 NOTE — Telephone Encounter (Signed)
ok 

## 2015-05-30 NOTE — Telephone Encounter (Signed)
Spoke with patient and he states he was suppose to have a CBC at his oncology office on Friday. He cancelled his OV to do the radiology tests we ordered. He is wondering if Lori Hvozdovic, PA-C would order a CBC for him. (he has a hx of elevated WBC) Please, advise.

## 2015-06-01 ENCOUNTER — Ambulatory Visit (HOSPITAL_COMMUNITY)
Admission: RE | Admit: 2015-06-01 | Discharge: 2015-06-01 | Disposition: A | Discharge status: Home / Self Care | Payer: BLUE CROSS/BLUE SHIELD | Source: Ambulatory Visit | Attending: Physician Assistant | Admitting: Physician Assistant

## 2015-06-01 ENCOUNTER — Other Ambulatory Visit (INDEPENDENT_AMBULATORY_CARE_PROVIDER_SITE_OTHER): Payer: BLUE CROSS/BLUE SHIELD

## 2015-06-01 DIAGNOSIS — R1010 Upper abdominal pain, unspecified: Secondary | ICD-10-CM | POA: Insufficient documentation

## 2015-06-01 DIAGNOSIS — R160 Hepatomegaly, not elsewhere classified: Secondary | ICD-10-CM | POA: Insufficient documentation

## 2015-06-01 DIAGNOSIS — K219 Gastro-esophageal reflux disease without esophagitis: Secondary | ICD-10-CM | POA: Diagnosis not present

## 2015-06-01 DIAGNOSIS — R1310 Dysphagia, unspecified: Secondary | ICD-10-CM

## 2015-06-01 DIAGNOSIS — R109 Unspecified abdominal pain: Secondary | ICD-10-CM

## 2015-06-01 DIAGNOSIS — R1013 Epigastric pain: Secondary | ICD-10-CM | POA: Insufficient documentation

## 2015-06-01 DIAGNOSIS — R161 Splenomegaly, not elsewhere classified: Secondary | ICD-10-CM | POA: Insufficient documentation

## 2015-06-01 DIAGNOSIS — R112 Nausea with vomiting, unspecified: Secondary | ICD-10-CM

## 2015-06-01 LAB — CBC WITH DIFFERENTIAL/PLATELET
Basophils Absolute: 0 10*3/uL (ref 0.0–0.1)
Basophils Relative: 0.1 % (ref 0.0–3.0)
Eosinophils Absolute: 0.1 10*3/uL (ref 0.0–0.7)
Eosinophils Relative: 0.3 % (ref 0.0–5.0)
HCT: 40.1 % (ref 39.0–52.0)
Hemoglobin: 13.5 g/dL (ref 13.0–17.0)
Lymphocytes Relative: 82 % — ABNORMAL HIGH (ref 12.0–46.0)
Lymphs Abs: 20 10*3/uL — ABNORMAL HIGH (ref 0.7–4.0)
MCHC: 33.7 g/dL (ref 30.0–36.0)
MCV: 92.8 fl (ref 78.0–100.0)
Monocytes Absolute: 1.6 10*3/uL — ABNORMAL HIGH (ref 0.1–1.0)
Monocytes Relative: 6.6 % (ref 3.0–12.0)
Neutro Abs: 2.7 10*3/uL (ref 1.4–7.7)
Neutrophils Relative %: 11 % — ABNORMAL LOW (ref 43.0–77.0)
Platelets: 102 10*3/uL — ABNORMAL LOW (ref 150.0–400.0)
RBC: 4.32 Mil/uL (ref 4.22–5.81)
RDW: 13.4 % (ref 11.5–15.5)
WBC: 24.4 10*3/uL (ref 4.0–10.5)

## 2015-06-06 ENCOUNTER — Encounter: Payer: Self-pay | Admitting: Cardiology

## 2015-06-06 ENCOUNTER — Ambulatory Visit (INDEPENDENT_AMBULATORY_CARE_PROVIDER_SITE_OTHER): Payer: BLUE CROSS/BLUE SHIELD | Admitting: Cardiology

## 2015-06-06 VITALS — BP 128/80 | HR 119 | Ht 69.0 in | Wt 202.0 lb

## 2015-06-06 DIAGNOSIS — I491 Atrial premature depolarization: Secondary | ICD-10-CM | POA: Diagnosis not present

## 2015-06-06 DIAGNOSIS — I517 Cardiomegaly: Secondary | ICD-10-CM

## 2015-06-06 DIAGNOSIS — I10 Essential (primary) hypertension: Secondary | ICD-10-CM

## 2015-06-06 DIAGNOSIS — R072 Precordial pain: Secondary | ICD-10-CM | POA: Diagnosis not present

## 2015-06-06 MED ORDER — CARVEDILOL 3.125 MG PO TABS: 3.1250 mg | ORAL_TABLET | Freq: Two times a day (BID) | ORAL | Status: DC

## 2015-06-06 NOTE — Patient Instructions (Signed)
Medication Instructions:   START TAKING CARVEDILOL 3.125 MG BY MOUTH TWICE DAILY        Follow-Up:  Your physician wants you to follow-up in: [place] will receive a reminder letter in the mail two months in advance. If you don't receive a letter, please call our office to schedule the follow-up appointment.        If you need a refill on your cardiac medications before your next appointment, please call your pharmacy.

## 2015-06-06 NOTE — Progress Notes (Signed)
Patient ID: Todd Novak, male   DOB: 13-Aug-1965, 49 y.o.   MRN: [id number]      Cardiology Office Note   Date:  06/06/2015   ID:  Todd Novak, DOB 08/25/1965, MRN [id number]  PCP:  Leeanne Rio, PA-C  Cardiologist:   Dorothy Spark, MD   Chief complain; CP, DOE   History of Present Illness: Todd Novak is a 49 y.o. male  Patient presents to clinic today c/o sinus pressure, not related to activity, but associated with dizziness, no syncope. He also describes sudden onset palpitations that started about 2 months ago, worsening in frequency and length, now every day, can last up to an hour, associated with SOB and dizziness. No claudications, orthopnea, PND. No FH of premature CAD. He exercises on a regular basis.   Past Medical History  Diagnosis Date  . GERD (gastroesophageal reflux disease)   . HTN (hypertension)   . Environmental allergies   . Asthma     childhood, resolved  . Heart murmur   . Fatigue   . CLL (chronic lymphocytic leukemia) ([place])    Past Surgical History  Procedure Laterality Date  . Appendectomy    . Wisdom tooth extraction    . Cholecystectomy     Current Outpatient Prescriptions  Medication Sig Dispense Refill  . acetaminophen (TYLENOL) 325 MG tablet Take 650 mg by mouth as needed.    . Ascorbic Acid (VITAMIN C) 100 MG tablet Take 100 mg by mouth daily.    Marland Kitchen aspirin 81 MG tablet Take 81 mg by mouth daily.    Marland Kitchen azithromycin (ZITHROMAX) 250 MG tablet Take 2 tablets on Day 1. Then take 1 tablet daily. 6 each 0  . fexofenadine (ALLEGRA) 180 MG tablet Take 180 mg by mouth as needed for allergies or rhinitis (seasonal).    . fluticasone (FLONASE) 50 MCG/ACT nasal spray Place into the nose. 2 sprays into each nostril daily as needed for seasonal allergies    . lisinopril (PRINIVIL,ZESTRIL) 20 MG tablet Take 1 tablet (20 mg total) by mouth daily. 90 tablet 0  . Multiple Vitamin (MULTIVITAMIN) tablet Take 1 tablet by mouth daily.    .  pantoprazole (PROTONIX) 40 MG tablet Take 1 tablet (40 mg total) by mouth 2 (two) times daily. 60 tablet 3  . vitamin E 400 UNIT capsule Take 400 Units by mouth daily.     No current facility-administered medications for this visit.   Allergies:   Methocarbamol and Tramadol   Social History:  The patient  reports that he has never smoked. He has never used smokeless tobacco. He reports that he drinks about 1.8 oz of alcohol per week. He reports that he does not use illicit drugs.   Family History:  The patient's family history includes Healthy in his daughter, sister, and son; Hypertension in his maternal grandfather, maternal grandmother, and mother; Hypertension (age of onset: 68) in his father. There is no history of Cancer, Diabetes, or Heart disease.   ROS:  Please see the history of present illness.   Otherwise, review of systems are positive for none.   All other systems are reviewed and negative.   PHYSICAL EXAM: VS:  BP 128/80 mmHg  Pulse 119  Ht 5\' 9"  (1.753 m)  Wt 202 lb (91.627 kg)  BMI 29.82 kg/m2 , BMI Body mass index is 29.82 kg/(m^2). GEN: Well nourished, well developed, in no acute distress HEENT: normal Neck: no JVD, carotid bruits, or masses Cardiac: RRR; no murmurs, rubs,  or gallops,no edema  Respiratory:  clear to auscultation bilaterally, normal work of breathing GI: soft, nontender, nondistended, + BS MS: no deformity or atrophy Skin: warm and dry, no rash Neuro:  Strength and sensation are intact Psych: euthymic mood, full affect  Recent Labs: 05/25/2015: ALT 31; BUN 16; Creatinine, Ser 1.11; Potassium 4.1; Sodium 138 06/01/2015: Hemoglobin 13.5; Platelets 102.0*   Lipid Panel No results found for: CHOL, TRIG, HDL, CHOLHDL, VLDL, LDLCALC, LDLDIRECT   Wt Readings from Last 3 Encounters:  06/06/15 202 lb (91.627 kg)  05/25/15 202 lb 6 oz (91.797 kg)  05/22/15 206 lb (93.441 kg)    TTE:  04/25/2015  - Left ventricle: The cavity size was normal. There was  moderate concentric hypertrophy. Systolic function was vigorous. The estimated ejection fraction was in the range of 65% to 70%. Wall motion was normal; there were no regional wall motion abnormalities. Doppler parameters are consistent with abnormal left ventricular relaxation (grade 1 diastolic dysfunction). - Aortic valve: There was mild stenosis. Mean gradient (S): 10 mm Hg. Peak gradient (S): 17 mm Hg. Valve area (VTI): 3.73 cm^2. Valve area (Vmax): 3.8 cm^2. Valve area (Vmean): 4.07 cm^2. - Left atrium: The atrium was mildly dilated. - Right ventricle: The cavity size was normal. Wall thickness was normal. Systolic function was normal. - Right atrium: The atrium was normal in size. - Tricuspid valve: There was trivial regurgitation. - Pulmonic valve: There was no regurgitation. - Pulmonary arteries: Systolic pressure was within the normal range. - Inferior vena cava: The vessel was normal in size. The respirophasic diameter changes were in the normal range (= 50%), consistent with normal central venous pressure. - Pericardium, extracardiac: There was no pericardial effusion.  EKG:  SR, LVH with strain, RVH, possible prior posterior MI, possible lateral ischemia     ASSESSMENT AND PLAN:  1.  Chest pain - with some typical and atypical features, however highly abnormal ECG, an exercise nuclear stress test was negative for a prior infarct or ischemia. Echo showed moderate concentric LVH, grade 1 DD.   2. Palpitations - a 24 hour Holter monitor showed 1200 PACs and few PVCs in 24 hours, we will add carvedilol 3.125 mg PO BID.  3. Hypertension - elevated, also moderate LVH, continue lisinopril and add carvedilol 3.125 mg po BID.   Follow up in 6 months.  Signed, Dorothy Spark, MD  06/06/2015 9:29 AM    [address], [place], [place]  [address] Phone: [phone]; Fax: [phone]

## 2015-06-07 ENCOUNTER — Ambulatory Visit: Payer: BLUE CROSS/BLUE SHIELD | Admitting: Gastroenterology

## 2015-06-26 ENCOUNTER — Other Ambulatory Visit: Payer: Self-pay | Admitting: Physician Assistant

## 2015-06-27 ENCOUNTER — Encounter: Payer: Self-pay | Admitting: Physician Assistant

## 2015-06-27 ENCOUNTER — Ambulatory Visit (INDEPENDENT_AMBULATORY_CARE_PROVIDER_SITE_OTHER): Payer: BLUE CROSS/BLUE SHIELD | Admitting: Physician Assistant

## 2015-06-27 ENCOUNTER — Telehealth: Payer: Self-pay | Admitting: *Deleted

## 2015-06-27 VITALS — BP 134/90 | HR 80 | Temp 98.0°F | Resp 16 | Ht 69.0 in | Wt 203.5 lb

## 2015-06-27 DIAGNOSIS — J202 Acute bronchitis due to streptococcus: Secondary | ICD-10-CM

## 2015-06-27 MED ORDER — ALBUTEROL SULFATE HFA 108 (90 BASE) MCG/ACT IN AERS: 2.0000 | INHALATION_SPRAY | Freq: Four times a day (QID) | RESPIRATORY_TRACT | Status: DC | PRN

## 2015-06-27 MED ORDER — BENZONATATE 200 MG PO CAPS: 200.0000 mg | ORAL_CAPSULE | Freq: Two times a day (BID) | ORAL | Status: DC | PRN

## 2015-06-27 MED ORDER — DOXYCYCLINE MONOHYDRATE 100 MG PO CAPS: 100.0000 mg | ORAL_CAPSULE | Freq: Two times a day (BID) | ORAL | Status: DC

## 2015-06-27 NOTE — Progress Notes (Signed)
Pre visit review using our clinic review tool, if applicable. No additional management support is needed unless otherwise documented below in the visit note/SLS  

## 2015-06-27 NOTE — Patient Instructions (Signed)
Take antibiotic (Doxycycline) as directed.  Increase fluids.  Get plenty of rest. Use Mucinex for congestion. Use Tessalon as directed for cough and Albuterol as directed for chest tightness. Take a daily probiotic (I recommend Align or Culturelle, but even Activia Yogurt may be beneficial).  A humidifier placed in the bedroom may offer some relief for a dry, scratchy throat of nasal irritation.  Read information below on acute bronchitis. Please call or return to clinic if symptoms are not improving.  Acute Bronchitis Bronchitis is when the airways that extend from the windpipe into the lungs get red, puffy, and painful (inflamed). Bronchitis often causes thick spit (mucus) to develop. This leads to a cough. A cough is the most common symptom of bronchitis. In acute bronchitis, the condition usually begins suddenly and goes away over time (usually in 2 weeks). Smoking, allergies, and asthma can make bronchitis worse. Repeated episodes of bronchitis may cause more lung problems.  HOME CARE  Rest.  Drink enough fluids to keep your pee (urine) clear or pale yellow (unless you need to limit fluids as told by your doctor).  Only take over-the-counter or prescription medicines as told by your doctor.  Avoid smoking and secondhand smoke. These can make bronchitis worse. If you are a smoker, think about using nicotine gum or skin patches. Quitting smoking will help your lungs heal faster.  Reduce the chance of getting bronchitis again by:  Washing your hands often.  Avoiding people with cold symptoms.  Trying not to touch your hands to your mouth, nose, or eyes.  Follow up with your doctor as told.  GET HELP IF: Your symptoms do not improve after 1 week of treatment. Symptoms include:  Cough.  Fever.  Coughing up thick spit.  Body aches.  Chest congestion.  Chills.  Shortness of breath.  Sore throat.  GET HELP RIGHT AWAY IF:   You have an increased fever.  You have  chills.  You have severe shortness of breath.  You have bloody thick spit (sputum).  You throw up (vomit) often.  You lose too much body fluid (dehydration).  You have a severe headache.  You faint.  MAKE SURE YOU:   Understand these instructions.  Will watch your condition.  Will get help right away if you are not doing well or get worse. Document Released: 12/31/2007 Document Revised: 03/16/2013 Document Reviewed: 01/04/2013 [facility] Patient Information 2015 [place], [place]. This information is not intended to replace advice given to you by your health care provider. Make sure you discuss any questions you have with your health care provider.

## 2015-06-27 NOTE — Progress Notes (Signed)
  Physical Exam  Subjective:     Todd Novak is a 49 y.o. male who presents for evaluation of symptoms of a URI. Symptoms include congestion, no  fever, post nasal drip, productive cough with  green colored sputum, sinus pressure and sore throat. Endorses mild wheezing. Onset of symptoms was 2 weeks ago, and has been gradually worsening since that time. Treatment to date: none.  The following portions of the patient's history were reviewed and updated as appropriate: allergies, current medications, past family history, past medical history, past social history, past surgical history and problem list.  Review of Systems Pertinent items are noted in HPI.   Objective:    BP 134/90 mmHg  Pulse 80  Temp(Src) 98 F (36.7 C) (Oral)  Resp 16  Ht 5\' 9"  (1.753 m)  Wt 203 lb 8 oz (92.307 kg)  BMI 30.04 kg/m2  SpO2 99% General appearance: alert, cooperative, appears stated age and no distress Head: Normocephalic, without obvious abnormality, atraumatic Ears: normal TM's and external ear canals both ears Nose: Nares normal. Septum midline. Mucosa normal. No drainage or sinus tenderness. Throat: lips, mucosa, and tongue normal; teeth and gums normal Lungs: clear to auscultation bilaterally Heart: regular rate and rhythm, S1, S2 normal, no murmur, click, rub or gallop   Assessment:    bronchitis and bronchospasm   Plan:    Discussed diagnosis and treatment of URI. Suggested symptomatic OTC remedies. Nasal saline spray for congestion. doxycycline per orders. Follow up as needed. Albuterol per orders

## 2015-06-27 NOTE — Telephone Encounter (Signed)
LM for the patient with his wife to advise I am working on a prior authorization for the Pantoprazole sodium 40 mg.

## 2015-07-18 ENCOUNTER — Other Ambulatory Visit: Payer: Self-pay | Admitting: *Deleted

## 2015-07-18 ENCOUNTER — Telehealth: Payer: Self-pay | Admitting: *Deleted

## 2015-07-18 MED ORDER — PANTOPRAZOLE SODIUM 40 MG PO TBEC: 40.0000 mg | DELAYED_RELEASE_TABLET | Freq: Every day | ORAL | Status: DC

## 2015-07-18 NOTE — Telephone Encounter (Signed)
Spoke to the patient's wife Raiford Noble, designated party release signed.  I advised I sent a prescription for once daily pantoprazole sodium 40 mg. He is to try this for 30 days per [facility].  If his symptoms worsen or this is not helping they may consider approving twice daily.  I asked Beatice to have her husband call me when he has completed the 30 days and let me know how he is doing.  She thanked me for helping Laksh with his medication.

## 2015-07-18 NOTE — Telephone Encounter (Signed)
LM for the patient advising that per the insurance company, they will not approve the twice daily dosing.  They will approveone tablet once daily. I advised the patient I sent a prescription for one tablet daily in the morning  # 90 with 3 refills to [facility].

## 2015-07-18 NOTE — Telephone Encounter (Signed)
Received fax from [facility]. Faxed it 06-27-2015.  Refaxed it on 07-18-2015.  Have not heard from this benefits center.

## 2015-07-23 ENCOUNTER — Other Ambulatory Visit: Payer: Self-pay | Admitting: Physician Assistant

## 2015-08-02 ENCOUNTER — Telehealth: Payer: Self-pay | Admitting: Gastroenterology

## 2015-08-02 NOTE — Telephone Encounter (Signed)
No charge. 

## 2015-08-03 ENCOUNTER — Encounter: Payer: BLUE CROSS/BLUE SHIELD | Admitting: Gastroenterology

## 2015-08-10 ENCOUNTER — Ambulatory Visit: Payer: BLUE CROSS/BLUE SHIELD | Admitting: Cardiology

## 2015-08-20 ENCOUNTER — Other Ambulatory Visit: Payer: Self-pay | Admitting: Physician Assistant

## 2015-08-30 ENCOUNTER — Encounter: Payer: BLUE CROSS/BLUE SHIELD | Admitting: Gastroenterology

## 2015-10-03 ENCOUNTER — Other Ambulatory Visit: Payer: Self-pay | Admitting: Physician Assistant

## 2015-10-03 NOTE — Telephone Encounter (Signed)
Rx sent to the pharmacy by e-script.//AB/CMA 

## 2015-12-05 ENCOUNTER — Other Ambulatory Visit: Payer: Self-pay | Admitting: Physician Assistant

## 2015-12-05 NOTE — Telephone Encounter (Signed)
Rx sent to the pharmacy by e-script.//AB/CMA 

## 2015-12-26 ENCOUNTER — Other Ambulatory Visit: Payer: Self-pay | Admitting: Physician Assistant

## 2015-12-26 NOTE — Telephone Encounter (Signed)
Rx sent to the pharmacy by e-script.  However, the patient needs further evaluation and/or laboratory testing before further refills are given. Ask him to make an appointment for this.//AB/CMA

## 2016-01-22 ENCOUNTER — Other Ambulatory Visit: Payer: Self-pay | Admitting: Physician Assistant

## 2016-01-22 NOTE — Telephone Encounter (Signed)
Rx request to pharmacy/SLS Requested drug refills are authorized 30-day Only, however, the patient needs further evaluation and/or laboratory testing before further refills are given. Ask him to make an appointment for this.  Please call patient and schedule complete physical with fasting labs prior to any further refill authorizations/SLS 06/27 Thanks.

## 2016-01-22 NOTE — Telephone Encounter (Signed)
Request for 90-day supply Denied for second time; last CPE 11/24/13, patient must have appointment prior to any further refill authorizations/SLS 06/27

## 2016-01-25 NOTE — Telephone Encounter (Signed)
lvm advising patient of message below °

## 2016-03-17 ENCOUNTER — Telehealth: Payer: Self-pay | Admitting: Physician Assistant

## 2016-03-18 NOTE — Telephone Encounter (Signed)
Rx request Denied, pt has been informed numerous times that he needed appointment with prior refills w/o response/SLS 08/22  Please call patient and have complete Physical w/fasting labs appointment scheduled per provider/SLS 08/22

## 2016-03-19 NOTE — Telephone Encounter (Signed)
Called and informed patient that request had been denied for Omeprazole and he needs to schedule a fasting appointment. Patient stated that he will come in on Tuesday but he needs medication to last until then. States this is not a narcotic and he shouldn't have to be seen to get the medication. Did not want to schedule a CPE just an appointment to get his medication refilled. Request call back from [place].

## 2016-03-25 ENCOUNTER — Other Ambulatory Visit: Payer: Self-pay | Admitting: Physician Assistant

## 2016-03-25 ENCOUNTER — Telehealth: Payer: Self-pay

## 2016-03-25 ENCOUNTER — Ambulatory Visit (INDEPENDENT_AMBULATORY_CARE_PROVIDER_SITE_OTHER): Payer: BLUE CROSS/BLUE SHIELD | Admitting: Physician Assistant

## 2016-03-25 ENCOUNTER — Encounter: Payer: Self-pay | Admitting: Physician Assistant

## 2016-03-25 VITALS — BP 126/82 | HR 84 | Temp 98.2°F | Resp 16 | Ht 69.0 in | Wt 202.5 lb

## 2016-03-25 DIAGNOSIS — I10 Essential (primary) hypertension: Secondary | ICD-10-CM

## 2016-03-25 DIAGNOSIS — C9110 Chronic lymphocytic leukemia of B-cell type not having achieved remission: Secondary | ICD-10-CM | POA: Diagnosis not present

## 2016-03-25 DIAGNOSIS — L723 Sebaceous cyst: Secondary | ICD-10-CM | POA: Diagnosis not present

## 2016-03-25 LAB — BASIC METABOLIC PANEL
BUN: 15 mg/dL (ref 6–23)
CO2: 30 mEq/L (ref 19–32)
Calcium: 9.9 mg/dL (ref 8.4–10.5)
Chloride: 103 mEq/L (ref 96–112)
Creatinine, Ser: 1.11 mg/dL (ref 0.40–1.50)
GFR: 74.26 mL/min (ref >60.00)
Glucose, Bld: 98 mg/dL (ref 70–99)
Potassium: 5.1 mEq/L (ref 3.5–5.1)
Sodium: 136 mEq/L (ref 135–145)

## 2016-03-25 LAB — CBC WITH DIFFERENTIAL/PLATELET
Basophils Absolute: 0 10*3/uL (ref 0.0–0.1)
Basophils Relative: 0.1 % (ref 0.0–3.0)
Eosinophils Absolute: 0.1 10*3/uL (ref 0.0–0.7)
Eosinophils Relative: 0.3 % (ref 0.0–5.0)
HCT: 43.1 % (ref 39.0–52.0)
Hemoglobin: 14.8 g/dL (ref 13.0–17.0)
Lymphocytes Relative: 84.7 % — ABNORMAL HIGH (ref 12.0–46.0)
Lymphs Abs: 28.7 10*3/uL — ABNORMAL HIGH (ref 0.7–4.0)
MCHC: 34.3 g/dL (ref 30.0–36.0)
MCV: 92.7 fl (ref 78.0–100.0)
Monocytes Absolute: 2.1 10*3/uL — ABNORMAL HIGH (ref 0.1–1.0)
Monocytes Relative: 6.1 % (ref 3.0–12.0)
Neutro Abs: 3 10*3/uL (ref 1.4–7.7)
Neutrophils Relative %: 8.8 % — ABNORMAL LOW (ref 43.0–77.0)
Platelets: 114 10*3/uL — ABNORMAL LOW (ref 150.0–400.0)
RBC: 4.65 Mil/uL (ref 4.22–5.81)
RDW: 13.4 % (ref 11.5–15.5)
WBC: 33.8 10*3/uL (ref 4.0–10.5)

## 2016-03-25 LAB — LIPID PANEL
Cholesterol: 217 mg/dL — ABNORMAL HIGH (ref 0–200)
HDL: 33.7 mg/dL — ABNORMAL LOW (ref >39.00)
NonHDL: 183.04
Total CHOL/HDL Ratio: 6
Triglycerides: 218 mg/dL — ABNORMAL HIGH (ref 0.0–149.0)
VLDL: 43.6 mg/dL — ABNORMAL HIGH (ref 0.0–40.0)

## 2016-03-25 LAB — LDL CHOLESTEROL, DIRECT: Direct LDL: 138 mg/dL

## 2016-03-25 MED ORDER — LISINOPRIL 20 MG PO TABS: ORAL_TABLET | ORAL | 1 refills | Status: DC

## 2016-03-25 MED ORDER — OMEPRAZOLE 40 MG PO CPDR: 40.0000 mg | DELAYED_RELEASE_CAPSULE | Freq: Every day | ORAL | 3 refills | Status: DC

## 2016-03-25 MED ORDER — OMEPRAZOLE 40 MG PO CPDR: 40.0000 mg | DELAYED_RELEASE_CAPSULE | Freq: Every day | ORAL | 1 refills | Status: DC

## 2016-03-25 NOTE — Telephone Encounter (Signed)
Critical lab value- WBC 33.8  Please advise.

## 2016-03-25 NOTE — Telephone Encounter (Signed)
Don't see where I refused his Omeprazole. I do see where he has been told several times he needs follow-up regarding BP and further refills of his lisinopril. Has not had labs in 1 year. He needs to be seen every 6 months for BP.

## 2016-03-25 NOTE — Patient Instructions (Signed)
Please go to the lab for blood work. I will call you with your results.  Please continue medications as directed. As long as things are going well, we will follow-up in 6 months.  Follow-up with your oncologist as scheduled.

## 2016-03-25 NOTE — Assessment & Plan Note (Signed)
Referral to dermatology placed for excision.

## 2016-03-25 NOTE — Progress Notes (Signed)
Patient presents to clinic today for follow-up of hypertension and to discuss labs. Patient also with skin lesion he would like examined.  Hypertension -- Patient currently on lisinopril 20 mg daily. Is taking daily as directed. Patient denies chest pain, palpitations, lightheadedness, dizziness, vision changes or frequent headaches.  BP Readings from Last 3 Encounters:  03/25/16 126/82  06/27/15 134/90  06/06/15 128/80   Patient with history of CLL, followed by Oncology (Dr. Della Goo). Is requesting repeat CBC as he is to have checked every 3 months per specialist. Denies fever, sweats, weight changes. Patient would also like to have lipid panel checked.   Patient endorses non-painful, non-pruritic lesion of medial posterior L shoulder, present for several years. Denies drainage from the area. Would like assessed. Denies hx of skin cancer.   Past Medical History:  Diagnosis Date  . Asthma    childhood, resolved  . CLL (chronic lymphocytic leukemia) ([place])   . Environmental allergies   . Fatigue   . GERD (gastroesophageal reflux disease)   . Heart murmur   . HTN (hypertension)     Current Outpatient Prescriptions on File Prior to Visit  Medication Sig Dispense Refill  . Multiple Vitamin (MULTIVITAMIN) tablet Take 1 tablet by mouth daily.    . vitamin E 400 UNIT capsule Take 400 Units by mouth daily.    Marland Kitchen acetaminophen (TYLENOL) 325 MG tablet Take 650 mg by mouth as needed.    Marland Kitchen albuterol (PROVENTIL HFA;VENTOLIN HFA) 108 (90 BASE) MCG/ACT inhaler Inhale 2 puffs into the lungs every 6 (six) hours as needed for wheezing or shortness of breath. 1 Inhaler 0  . Ascorbic Acid (VITAMIN C) 100 MG tablet Take 100 mg by mouth daily.    Marland Kitchen aspirin 81 MG tablet Take 81 mg by mouth daily as needed.     . fexofenadine (ALLEGRA) 180 MG tablet TAKE 1 TABLET BY MOUTH DAILY 30 tablet 5  . fluticasone (FLONASE) 50 MCG/ACT nasal spray Place into the nose. 2 sprays into each nostril daily as needed for  seasonal allergies     No current facility-administered medications on file prior to visit.     Allergies  Allergen Reactions  . Methocarbamol Nausea Only    dizziness  . Tramadol Nausea And Vomiting    Family History  Problem Relation Age of Onset  . Hypertension Mother     Living  . Hypertension Father 46    Deceased  . Hypertension Maternal Grandmother   . Hypertension Maternal Grandfather   . Healthy Sister   . Healthy Son     x1  . Healthy Daughter     x2  . Cancer Neg Hx   . Diabetes Neg Hx   . Heart disease Neg Hx     Social History   Social History  . Marital status: Married    Spouse name: N/A  . Number of children: 3  . Years of education: N/A   Occupational History  . Sales     Social History Main Topics  . Smoking status: Never Smoker  . Smokeless tobacco: Never Used  . Alcohol use 1.8 oz/week    3 Glasses of wine per week     Comment: Occ  . Drug use: No  . Sexual activity: Not Asked   Other Topics Concern  . None   Social History Narrative  . None   Review of Systems - See HPI.  All other ROS are negative.  BP 126/82 (BP Location: Left Arm,  Patient Position: Sitting, Cuff Size: Large)   Pulse 84   Temp 98.2 F (36.8 C) (Oral)   Resp 16   Ht 5\' 9"  (1.753 m)   Wt 202 lb 8 oz (91.9 kg)   SpO2 98%   BMI 29.90 kg/m   Physical Exam  Constitutional: He is well-developed, well-nourished, and in no distress.  HENT:  Head: Normocephalic and atraumatic.  Eyes: Conjunctivae are normal.  Cardiovascular: Normal rate, regular rhythm, normal heart sounds and intact distal pulses.   Pulmonary/Chest: Effort normal and breath sounds normal. No respiratory distress. He has no wheezes. He has no rales. He exhibits no tenderness.  Skin: Skin is warm and dry. No rash noted.     Vitals reviewed.  Assessment/Plan: CLL (chronic lymphocytic leukemia) Will repeat CBC today. Patient has follow-up scheduled with Oncology.  Hypertension BP stable.  Will check BMP today. Continue current regimen. Will also check fasting lipid panel today.  Sebaceous cyst Referral to dermatology placed for excision.    Leeanne Rio, PA-C

## 2016-03-25 NOTE — Assessment & Plan Note (Signed)
Will repeat CBC today. Patient has follow-up scheduled with Oncology.

## 2016-03-25 NOTE — Telephone Encounter (Signed)
Patient has CLL. This is a normal value for him. I will call him with results. Nothing further needed at present. He has follow-up scheduled with his Oncologist.

## 2016-03-25 NOTE — Assessment & Plan Note (Signed)
BP stable. Will check BMP today. Continue current regimen. Will also check fasting lipid panel today.

## 2016-04-10 DIAGNOSIS — Z6830 Body mass index (BMI) 30.0-30.9, adult: Secondary | ICD-10-CM | POA: Diagnosis not present

## 2016-04-10 DIAGNOSIS — I10 Essential (primary) hypertension: Secondary | ICD-10-CM | POA: Diagnosis not present

## 2016-04-10 DIAGNOSIS — Z79899 Other long term (current) drug therapy: Secondary | ICD-10-CM | POA: Diagnosis not present

## 2016-04-10 DIAGNOSIS — D693 Immune thrombocytopenic purpura: Secondary | ICD-10-CM | POA: Diagnosis not present

## 2016-04-10 DIAGNOSIS — R590 Localized enlarged lymph nodes: Secondary | ICD-10-CM | POA: Diagnosis not present

## 2016-04-10 DIAGNOSIS — C9110 Chronic lymphocytic leukemia of B-cell type not having achieved remission: Secondary | ICD-10-CM | POA: Diagnosis not present

## 2016-04-10 DIAGNOSIS — Z7951 Long term (current) use of inhaled steroids: Secondary | ICD-10-CM | POA: Diagnosis not present

## 2016-05-30 ENCOUNTER — Ambulatory Visit (INDEPENDENT_AMBULATORY_CARE_PROVIDER_SITE_OTHER): Payer: BLUE CROSS/BLUE SHIELD | Admitting: Family

## 2016-05-30 ENCOUNTER — Encounter: Payer: Self-pay | Admitting: Family

## 2016-05-30 VITALS — BP 138/92 | HR 71 | Temp 98.7°F | Resp 20 | Ht 68.5 in | Wt 204.4 lb

## 2016-05-30 DIAGNOSIS — J209 Acute bronchitis, unspecified: Secondary | ICD-10-CM | POA: Diagnosis not present

## 2016-05-30 DIAGNOSIS — J202 Acute bronchitis due to streptococcus: Secondary | ICD-10-CM

## 2016-05-30 DIAGNOSIS — Z23 Encounter for immunization: Secondary | ICD-10-CM | POA: Diagnosis not present

## 2016-05-30 MED ORDER — ALBUTEROL SULFATE (2.5 MG/3ML) 0.083% IN NEBU
2.5000 mg | INHALATION_SOLUTION | Freq: Once | RESPIRATORY_TRACT | Status: AC
  Administered 2016-05-30: 2.5 mg via RESPIRATORY_TRACT

## 2016-05-30 MED ORDER — AZITHROMYCIN 250 MG PO TABS: ORAL_TABLET | ORAL | 0 refills | Status: DC

## 2016-05-30 MED ORDER — BENZONATATE 100 MG PO CAPS: 100.0000 mg | ORAL_CAPSULE | Freq: Three times a day (TID) | ORAL | 0 refills | Status: DC | PRN

## 2016-05-30 MED ORDER — ALBUTEROL SULFATE HFA 108 (90 BASE) MCG/ACT IN AERS: 2.0000 | INHALATION_SPRAY | Freq: Four times a day (QID) | RESPIRATORY_TRACT | 0 refills | Status: DC | PRN

## 2016-05-30 MED ORDER — PREDNISONE 10 MG PO TABS: ORAL_TABLET | ORAL | 0 refills | Status: DC

## 2016-05-30 MED ORDER — FEXOFENADINE HCL 180 MG PO TABS: 180.0000 mg | ORAL_TABLET | Freq: Every day | ORAL | 5 refills | Status: AC

## 2016-05-30 NOTE — Progress Notes (Signed)
Pre visit review using our clinic review tool, if applicable. No additional management support is needed unless otherwise documented below in the visit note. 

## 2016-05-30 NOTE — Progress Notes (Signed)
Subjective:    Patient ID: Todd Novak, male    DOB: 03-07-1966, 50 y.o.   MRN: [id number]  HPI  Todd Novak is a 50 yr old male who presents today with chief complaint of cough. Reports that cough as been present x 2 weeks. Cough is non-productive. Denies associated fever. Reports associated sinus congestion.  Cough is exacerbated by deep breaths.  Reports + fatigue.  Denies ear pain.  + hx asthma, reports breathing feels tight and he has had wheezing.  Noted that he did try his albuterol inhaler and this did not seem to help much.   Review of Systems    see HPI  Past Medical History:  Diagnosis Date  . Asthma    childhood, resolved  . CLL (chronic lymphocytic leukemia) ([place])   . Environmental allergies   . Fatigue   . GERD (gastroesophageal reflux disease)   . Heart murmur   . HTN (hypertension)      Social History   Social History  . Marital status: Married    Spouse name: N/A  . Number of children: 3  . Years of education: N/A   Occupational History  . Sales     Social History Main Topics  . Smoking status: Never Smoker  . Smokeless tobacco: Never Used  . Alcohol use 1.8 oz/week    3 Glasses of wine per week     Comment: Occ  . Drug use: No  . Sexual activity: Not on file   Other Topics Concern  . Not on file   Social History Narrative  . No narrative on file    Past Surgical History:  Procedure Laterality Date  . APPENDECTOMY    . CHOLECYSTECTOMY    . WISDOM TOOTH EXTRACTION      Family History  Problem Relation Age of Onset  . Hypertension Mother     Living  . Hypertension Father 95    Deceased  . Hypertension Maternal Grandmother   . Hypertension Maternal Grandfather   . Healthy Sister   . Healthy Son     x1  . Healthy Daughter     x2  . Cancer Neg Hx   . Diabetes Neg Hx   . Heart disease Neg Hx     Allergies  Allergen Reactions  . Methocarbamol Nausea Only    dizziness  . Tramadol Nausea And Vomiting    Current Outpatient  Prescriptions on File Prior to Visit  Medication Sig Dispense Refill  . acetaminophen (TYLENOL) 325 MG tablet Take 650 mg by mouth as needed.    . Ascorbic Acid (VITAMIN C) 100 MG tablet Take 100 mg by mouth daily.    Marland Kitchen aspirin 81 MG tablet Take 81 mg by mouth daily as needed.     . fexofenadine (ALLEGRA) 180 MG tablet TAKE 1 TABLET BY MOUTH DAILY 30 tablet 5  . lisinopril (PRINIVIL,ZESTRIL) 20 MG tablet TAKE 1 TABLET(20 MG) BY MOUTH DAILY 90 tablet 1  . Multiple Vitamin (MULTIVITAMIN) tablet Take 1 tablet by mouth daily.    Marland Kitchen omeprazole (PRILOSEC) 40 MG capsule Take 1 capsule (40 mg total) by mouth daily. 90 capsule 1  . vitamin E 400 UNIT capsule Take 400 Units by mouth daily.     No current facility-administered medications on file prior to visit.     BP (!) 127/97 (BP Location: Right Arm, Patient Position: Sitting, Cuff Size: Normal)   Pulse 71   Temp 98.7 F (37.1 C) (Oral)   Resp  20   Ht 5' 8.5" (1.74 m)   Wt 204 lb 6.4 oz (92.7 kg)   SpO2 98% Comment: RA  BMI 30.63 kg/m    Objective:   Physical Exam  Constitutional: He is oriented to person, place, and time. He appears well-developed and well-nourished. No distress.  HENT:  Head: Normocephalic and atraumatic.  Right Ear: Tympanic membrane and ear canal normal.  Left Ear: Tympanic membrane and ear canal normal.  Mouth/Throat: No oropharyngeal exudate, posterior oropharyngeal edema or posterior oropharyngeal erythema.  Eyes: No scleral icterus.  Cardiovascular: Normal rate and regular rhythm.   No murmur heard. Pulmonary/Chest: Effort normal. No respiratory distress. He has no rales.  + expiratory wheeze bilaterally with cough  Musculoskeletal: He exhibits no edema.  Lymphadenopathy:    He has no cervical adenopathy.  Neurological: He is alert and oriented to person, place, and time.  Skin: Skin is warm and dry.  Psychiatric: He has a normal mood and affect. His behavior is normal. Thought content normal.           Assessment & Plan:  Bronchitis with bronchospasm- Advised patient as follows:   For Bronchitis begin Azithromycin (antibiotic) and prednisone taper. For cough you may use tessalon as needed. For wheezing you may use albuterol every 6 hours as needed. Call if new/worsening symptoms, if you develop fever,  or if symptoms are not improved in 3 days.

## 2016-05-30 NOTE — Patient Instructions (Signed)
For Bronchitis begin Azithromycin (antibiotic) and prednisone taper. For cough you may use tessalon as needed. For wheezing you may use albuterol every 6 hours as needed. Call if new/worsening symptoms, if you develop fever,  or if symptoms are not improved in 3 days.

## 2016-06-09 ENCOUNTER — Encounter: Payer: Self-pay | Admitting: Family

## 2016-06-09 ENCOUNTER — Other Ambulatory Visit: Payer: Self-pay | Admitting: Family

## 2016-06-09 MED ORDER — LEVOFLOXACIN 500 MG PO TABS: 500.0000 mg | ORAL_TABLET | Freq: Every day | ORAL | 0 refills | Status: DC

## 2016-06-09 NOTE — Telephone Encounter (Signed)
I did not see patient. Will forward to evaluating provider to make decision on refills.

## 2016-06-09 NOTE — Telephone Encounter (Signed)
Dry cough, no fever. Was getting better until abx and prednisone stopped. Unable to return to the office for re-evaluation due to work. Doesn't feel that the albuterol has any effect on his symptoms.  Requests extension of abx. Will send levaquin, however pt is cautioned that if symptoms worsen or if not improved in 2-3 days that he needs re-evaluation in the office. Pt verbalizes understanding.

## 2016-06-25 NOTE — Telephone Encounter (Signed)
Complete

## 2016-08-15 DIAGNOSIS — C9110 Chronic lymphocytic leukemia of B-cell type not having achieved remission: Secondary | ICD-10-CM | POA: Diagnosis not present

## 2016-08-15 DIAGNOSIS — Z6831 Body mass index (BMI) 31.0-31.9, adult: Secondary | ICD-10-CM | POA: Diagnosis not present

## 2016-08-15 DIAGNOSIS — D693 Immune thrombocytopenic purpura: Secondary | ICD-10-CM | POA: Diagnosis not present

## 2016-08-15 DIAGNOSIS — I10 Essential (primary) hypertension: Secondary | ICD-10-CM | POA: Diagnosis not present

## 2016-08-15 DIAGNOSIS — R590 Localized enlarged lymph nodes: Secondary | ICD-10-CM | POA: Diagnosis not present

## 2016-08-15 DIAGNOSIS — Z79899 Other long term (current) drug therapy: Secondary | ICD-10-CM | POA: Diagnosis not present

## 2016-08-28 ENCOUNTER — Ambulatory Visit (INDEPENDENT_AMBULATORY_CARE_PROVIDER_SITE_OTHER): Payer: BLUE CROSS/BLUE SHIELD | Admitting: Family Medicine

## 2016-08-28 ENCOUNTER — Encounter: Payer: Self-pay | Admitting: Family Medicine

## 2016-08-28 VITALS — BP 127/87 | HR 95 | Temp 98.7°F | Ht 68.5 in | Wt 206.2 lb

## 2016-08-28 DIAGNOSIS — B9789 Other viral agents as the cause of diseases classified elsewhere: Secondary | ICD-10-CM | POA: Diagnosis not present

## 2016-08-28 DIAGNOSIS — J069 Acute upper respiratory infection, unspecified: Secondary | ICD-10-CM

## 2016-08-28 MED ORDER — PROMETHAZINE-CODEINE 6.25-10 MG/5ML PO SYRP: 5.0000 mL | ORAL_SOLUTION | Freq: Three times a day (TID) | ORAL | 0 refills | Status: DC | PRN

## 2016-08-28 NOTE — Patient Instructions (Addendum)
Benadryl at nighttime.  Nasonex; nasal spray that is over the counter. 2 sprays each nostril, once daily. Aim towards the same side eye when you spray.  Continue to push fluids, practice good hand hygiene, and cover your mouth if you cough.  Continue with OTC remedies only if helpful.

## 2016-08-28 NOTE — Progress Notes (Signed)
Chief Complaint  Patient presents with  . Transitions Of Care    sinus pressure,cough(product-green),runny nose,bodyaches-sxs started x 2 days.    [place] Ar here for URI complaints.  Duration: 2 days  Associated symptoms: rhinorrhea, productive cough (green), myalgia and sinus pressure Denies: fevers, shaking, itchy watery eyes, ear pain, ear drainage, sore throat and shortness of breath Treatment to date: Robitussin, Mucinex Sick contacts: No   ROS:  Const: Denies fevers HEENT: As noted in HPI Lungs: No SOB  Past Medical History:  Diagnosis Date  . Asthma    childhood, resolved  . CLL (chronic lymphocytic leukemia) ([place])   . Environmental allergies   . Fatigue   . GERD (gastroesophageal reflux disease)   . Heart murmur   . HTN (hypertension)    Family History  Problem Relation Age of Onset  . Hypertension Mother     Living  . Hypertension Father 56    Deceased  . Hypertension Maternal Grandmother   . Hypertension Maternal Grandfather   . Healthy Sister   . Healthy Son     x1  . Healthy Daughter     x2  . Cancer Neg Hx   . Diabetes Neg Hx   . Heart disease Neg Hx     BP 127/87 (BP Location: Left Arm, Patient Position: Sitting, Cuff Size: Large)   Pulse 95   Temp 98.7 F (37.1 C) (Oral)   Ht 5' 8.5" (1.74 m)   Wt 206 lb 3.2 oz (93.5 kg)   SpO2 99%   BMI 30.90 kg/m  General: Awake, alert, appears stated age HEENT: AT, [place], ears patent b/l and TM's neg, nares patent w/o discharge, no sinus tenderness, pharynx pink and without exudates, MMM Neck: No masses or asymmetry Heart: RRR, no murmurs, no bruits Lungs: CTAB, no accessory muscle use Psych: Age appropriate judgment and insight, normal mood and affect  Viral URI with cough - Plan: promethazine-codeine (PHENERGAN WITH CODEINE) 6.25-10 MG/5ML syrup  Orders as above. Discussed importance of treating symptoms and how abx are not likely to help an illness like this. He eventually agreed that giving  antibiotics for things that are not bacterial is indeed a fruitless practice. If he starts developing fevers, shortness of breath, or worsening symptoms, he will let us know. Continue to push fluids, practice good hand hygiene, cover mouth when coughing. F/u prn. Pt voiced understanding and agreement to the plan.  [place], DO 08/28/16 2:10 PM

## 2016-11-10 ENCOUNTER — Other Ambulatory Visit: Payer: Self-pay | Admitting: Physician Assistant

## 2016-11-10 ENCOUNTER — Encounter: Payer: Self-pay | Admitting: Emergency Medicine

## 2016-11-24 ENCOUNTER — Encounter: Payer: Self-pay | Admitting: Emergency Medicine

## 2017-01-06 ENCOUNTER — Other Ambulatory Visit: Payer: Self-pay | Admitting: Physician Assistant

## 2017-01-13 ENCOUNTER — Encounter (HOSPITAL_BASED_OUTPATIENT_CLINIC_OR_DEPARTMENT_OTHER): Payer: Self-pay

## 2017-01-13 ENCOUNTER — Ambulatory Visit (INDEPENDENT_AMBULATORY_CARE_PROVIDER_SITE_OTHER): Payer: BLUE CROSS/BLUE SHIELD | Admitting: Family

## 2017-01-13 ENCOUNTER — Encounter: Payer: Self-pay | Admitting: Family

## 2017-01-13 ENCOUNTER — Ambulatory Visit (HOSPITAL_BASED_OUTPATIENT_CLINIC_OR_DEPARTMENT_OTHER)
Admission: RE | Admit: 2017-01-13 | Discharge: 2017-01-13 | Disposition: A | Discharge status: Home / Self Care | Payer: BLUE CROSS/BLUE SHIELD | Source: Ambulatory Visit | Attending: Family | Admitting: Family

## 2017-01-13 ENCOUNTER — Telehealth: Payer: Self-pay | Admitting: Emergency Medicine

## 2017-01-13 VITALS — BP 128/93 | HR 96 | Temp 98.5°F | Resp 16 | Ht 68.5 in | Wt 201.8 lb

## 2017-01-13 DIAGNOSIS — R509 Fever, unspecified: Secondary | ICD-10-CM

## 2017-01-13 DIAGNOSIS — R1013 Epigastric pain: Secondary | ICD-10-CM | POA: Diagnosis not present

## 2017-01-13 DIAGNOSIS — R0789 Other chest pain: Secondary | ICD-10-CM | POA: Diagnosis not present

## 2017-01-13 DIAGNOSIS — C9110 Chronic lymphocytic leukemia of B-cell type not having achieved remission: Secondary | ICD-10-CM

## 2017-01-13 DIAGNOSIS — C9190 Lymphoid leukemia, unspecified not having achieved remission: Secondary | ICD-10-CM

## 2017-01-13 LAB — COMPREHENSIVE METABOLIC PANEL
ALT: 17 U/L (ref 0–53)
AST: 18 U/L (ref 0–37)
Albumin: 4.4 g/dL (ref 3.5–5.2)
Alkaline Phosphatase: 76 U/L (ref 39–117)
BUN: 13 mg/dL (ref 6–23)
CO2: 28 mEq/L (ref 19–32)
Calcium: 9.7 mg/dL (ref 8.4–10.5)
Chloride: 99 mEq/L (ref 96–112)
Creatinine, Ser: 1.02 mg/dL (ref 0.40–1.50)
GFR: 81.61 mL/min (ref >60.00)
Glucose, Bld: 156 mg/dL — ABNORMAL HIGH (ref 70–99)
Potassium: 4 mEq/L (ref 3.5–5.1)
Sodium: 134 mEq/L — ABNORMAL LOW (ref 135–145)
Total Bilirubin: 0.6 mg/dL (ref 0.2–1.2)
Total Protein: 7.1 g/dL (ref 6.0–8.3)

## 2017-01-13 LAB — CBC WITH DIFFERENTIAL/PLATELET
Basophils Absolute: 0.1 10*3/uL (ref 0.0–0.1)
Basophils Relative: 0.1 % (ref 0.0–3.0)
Eosinophils Absolute: 0 10*3/uL (ref 0.0–0.7)
Eosinophils Relative: 0 % (ref 0.0–5.0)
HCT: 40.3 % (ref 39.0–52.0)
Hemoglobin: 13.5 g/dL (ref 13.0–17.0)
Lymphocytes Relative: 85.6 % — ABNORMAL HIGH (ref 12.0–46.0)
Lymphs Abs: 42.3 10*3/uL — ABNORMAL HIGH (ref 0.7–4.0)
MCHC: 33.5 g/dL (ref 30.0–36.0)
MCV: 95.2 fl (ref 78.0–100.0)
Monocytes Absolute: 1.3 10*3/uL — ABNORMAL HIGH (ref 0.1–1.0)
Monocytes Relative: 2.7 % — ABNORMAL LOW (ref 3.0–12.0)
Neutro Abs: 5.7 10*3/uL (ref 1.4–7.7)
Neutrophils Relative %: 11.6 % — ABNORMAL LOW (ref 43.0–77.0)
Platelets: 104 10*3/uL — ABNORMAL LOW (ref 150.0–400.0)
RBC: 4.23 Mil/uL (ref 4.22–5.81)
RDW: 12.9 % (ref 11.5–15.5)
WBC: 49.4 10*3/uL (ref 4.0–10.5)

## 2017-01-13 LAB — LIPASE: Lipase: 13 U/L (ref 11.0–59.0)

## 2017-01-13 LAB — AMYLASE: Amylase: 29 U/L (ref 27–131)

## 2017-01-13 MED ORDER — OMEPRAZOLE 40 MG PO CPDR: 40.0000 mg | DELAYED_RELEASE_CAPSULE | Freq: Every day | ORAL | 1 refills | Status: DC

## 2017-01-13 MED ORDER — IOPAMIDOL (ISOVUE-300) INJECTION 61%
100.0000 mL | Freq: Once | INTRAVENOUS | Status: AC | PRN
  Administered 2017-01-13: 100 mL via INTRAVENOUS

## 2017-01-13 MED ORDER — VALACYCLOVIR HCL 1 G PO TABS: ORAL_TABLET | ORAL | 5 refills | Status: DC

## 2017-01-13 MED ORDER — LISINOPRIL 20 MG PO TABS: ORAL_TABLET | ORAL | 1 refills | Status: DC

## 2017-01-13 NOTE — Telephone Encounter (Signed)
Melissa-- Notified pt and he voices understanding. States he was contacted by GI and will be seeing them Tuesday at [facility]. He will return tomorrow for the xray and will schedule 1 week f/u with Melissa at that time. Pt requests to transfer care to [facility]. ?Ok to change PCP in EPIC.Marland KitchenMarland Kitchen

## 2017-01-13 NOTE — Telephone Encounter (Signed)
Notified pt. He states he would like to discuss this finding with Oncologist first and complete CT as scheduled at 2pm today. Notified PCP, she spoke with pt and advised ER evaluation due to elevated WBC and fever that he had reported. Pt said he would call PCP back and let her know which ER he decides to go to.

## 2017-01-13 NOTE — Progress Notes (Signed)
Subjective:    Patient ID: Todd Novak, male    DOB: [birth year], 51 y.o.   MRN: [id number]  HPI  Todd Novak is a 51 yr old male with history of Stage I/II CLL (followed by Dr. Cruzita Lederer), who presents today with chief complaint of abdominal pain.  It appears that his last visit with Dr. Cruzita Lederer was in January of this year.    Reports that for the last few days he had had epigastric pain which has been off/on for years but severe x 4 days. Reports temperature as high as 104, has had sweating, headache, dizziness, dry heaving.  Tolerating water and toast.    Reports that he had some left sided chest pain on Saturday/sunday, Monday.  Resolved today.   Review of Systems See HPI  Past Medical History:  Diagnosis Date  . Asthma    childhood, resolved  . CLL (chronic lymphocytic leukemia) ([place])   . Environmental allergies   . Fatigue   . GERD (gastroesophageal reflux disease)   . Heart murmur   . HTN (hypertension)      Social History   Social History  . Marital status: Married    Spouse name: N/A  . Number of children: 3  . Years of education: N/A   Occupational History  . Sales     Social History Main Topics  . Smoking status: Never Smoker  . Smokeless tobacco: Never Used  . Alcohol use 1.8 oz/week    3 Glasses of wine per week     Comment: Occ  . Drug use: No  . Sexual activity: Not on file   Other Topics Concern  . Not on file   Social History Narrative  . No narrative on file    Past Surgical History:  Procedure Laterality Date  . APPENDECTOMY    . CHOLECYSTECTOMY    . WISDOM TOOTH EXTRACTION      Family History  Problem Relation Age of Onset  . Hypertension Mother        Living  . Hypertension Father 42       Deceased  . Hypertension Maternal Grandmother   . Hypertension Maternal Grandfather   . Healthy Sister   . Healthy Son        x1  . Healthy Daughter        x2  . Cancer Neg Hx   . Diabetes Neg Hx   . Heart disease Neg Hx      Allergies  Allergen Reactions  . Methocarbamol Nausea Only    dizziness  . Tramadol Nausea And Vomiting    Current Outpatient Prescriptions on File Prior to Visit  Medication Sig Dispense Refill  . acetaminophen (TYLENOL) 325 MG tablet Take 650 mg by mouth as needed.    Marland Kitchen albuterol (PROVENTIL HFA;VENTOLIN HFA) 108 (90 Base) MCG/ACT inhaler Inhale 2 puffs into the lungs every 6 (six) hours as needed for wheezing or shortness of breath. 1 Inhaler 0  . Ascorbic Acid (VITAMIN C) 100 MG tablet Take 100 mg by mouth daily.    Marland Kitchen aspirin 81 MG tablet Take 81 mg by mouth daily as needed.     . fexofenadine (ALLEGRA) 180 MG tablet Take 1 tablet (180 mg total) by mouth daily. 30 tablet 5  . lisinopril (PRINIVIL,ZESTRIL) 20 MG tablet TAKE 1 TABLET(20 MG) BY MOUTH DAILY 30 tablet 0  . Multiple Vitamin (MULTIVITAMIN) tablet Take 1 tablet by mouth daily.    Marland Kitchen omeprazole (PRILOSEC) 40 MG capsule Take  1 capsule (40 mg total) by mouth daily. 90 capsule 1  . vitamin E 400 UNIT capsule Take 400 Units by mouth daily.     No current facility-administered medications on file prior to visit.     BP (!) 128/93 (BP Location: Left Arm, Cuff Size: Normal)   Pulse 96   Temp 98.5 F (36.9 C) (Oral)   Resp 16   Ht 5' 8.5" (1.74 m)   Wt 201 lb 12.8 oz (91.5 kg)   SpO2 99%   BMI 30.24 kg/m       Objective:   Physical Exam  Constitutional: He is oriented to person, place, and time. He appears well-developed and well-nourished. No distress.  HENT:  Head: Normocephalic and atraumatic.  Right Ear: Tympanic membrane and ear canal normal.  Left Ear: Tympanic membrane and ear canal normal.  Mouth/Throat: No oropharyngeal exudate, posterior oropharyngeal edema or posterior oropharyngeal erythema.  Neck:  Palpable cervical lymph nodes bilaterally  Cardiovascular: Normal rate and regular rhythm.   No murmur heard. Pulmonary/Chest: Effort normal and breath sounds normal. No respiratory distress. He has no  wheezes. He has no rales.  Abdominal: Soft. Bowel sounds are normal. He exhibits no distension. There is tenderness in the epigastric area. There is no rigidity and no guarding.  Musculoskeletal: He exhibits no edema.  Neurological: He is alert and oriented to person, place, and time.  Skin: Skin is warm and dry.  Psychiatric: He has a normal mood and affect. His behavior is normal. Thought content normal.          Assessment & Plan:  Epigastric pain- differential includes:  Pancreatitis, ulcer, constipation, obstruction, gastritis.   Will obtain CMET, CBC, lipase/amylase.  Will also obtain CT abd to further evaluate and arrange GI consultation.  He does admit to some "heavy" drinking on the weekends. I advised him that ETOH use can be associated with pancreatitis and advised him that eliminating alcohol can help to reduce recurrence of pancreatitis.   CLL- it appears that he has not accepted this diagnosis.  Check CBC with diff- management per hematology/oncology.    Atypical chest pain-  EKG is performed and personally reviewed. I have compared to previous EKG.  EKG notes NSR and findings consistent with LVH. Appears unchanged compared to prior. Pt is advised:   Go to the ER if worsening abdominal pain,inability to keep down food/water, fever >101 or if you develop recurrent chest pain.

## 2017-01-13 NOTE — Telephone Encounter (Signed)
I left a message for his oncologist re: fevers and elevated WBC.

## 2017-01-13 NOTE — Telephone Encounter (Signed)
Ok

## 2017-01-13 NOTE — Telephone Encounter (Signed)
Please contact pt and let him know that I reviewed his CT scan.  CT does not show any sign of pancreatitis.  No acute findings to explain his pain.  I would recommend referral to GI for further evaluation of his abdominal pain. He should schedule follow up with his oncologist.    Since his CT is negative, I think it would be a good idea to check a chest xray to evaluate him for pneumonia.  I have placed an order for chest xray. If recurrent fever >101 or severe recurrent abdominal pain he should proceed to the ER.  Follow up with PCP in 1 week.

## 2017-01-13 NOTE — Telephone Encounter (Signed)
Reviewed WBC was 26K earlier in January.  Now up to 49.4.  Would recommend that he go to the ER for further evaluation/work up.

## 2017-01-13 NOTE — Patient Instructions (Addendum)
Please complete lab work prior to leaving. Go to the ER if worsening abdominal pain,inability to keep down food/water, fever >101 or if you develop recurrent chest pain. Complete CT scan on the first floor.

## 2017-01-13 NOTE — Telephone Encounter (Signed)
"  CRITICAL VALUE STICKER  CRITICAL VALUE:[address] 49.4  RECEIVER (on-site recipient of call):Jordana Dugue  DATE & TIME NOTIFIED: 01-13-17 @ 1315  MESSENGER (representative from lab):Karen  MD NOTIFIED: Edwena Blow  TIME OF NOTIFICATION:1320  RESPONSE:

## 2017-01-14 NOTE — Telephone Encounter (Signed)
PCP changed in EPIC.

## 2017-01-15 ENCOUNTER — Telehealth: Payer: Self-pay | Admitting: Family

## 2017-01-15 ENCOUNTER — Ambulatory Visit (HOSPITAL_BASED_OUTPATIENT_CLINIC_OR_DEPARTMENT_OTHER)
Admission: RE | Admit: 2017-01-15 | Discharge: 2017-01-15 | Disposition: A | Discharge status: Home / Self Care | Payer: BLUE CROSS/BLUE SHIELD | Source: Ambulatory Visit | Attending: Family | Admitting: Family

## 2017-01-15 DIAGNOSIS — R509 Fever, unspecified: Secondary | ICD-10-CM | POA: Diagnosis not present

## 2017-01-15 DIAGNOSIS — R05 Cough: Secondary | ICD-10-CM | POA: Diagnosis not present

## 2017-01-15 NOTE — Telephone Encounter (Signed)
Attempted to reach pt and left message to check mychart acct. Message sent. 

## 2017-01-15 NOTE — Telephone Encounter (Signed)
Notified pt. He states he is feeling better except difficulty taking a deep breath due to hard coughing when he tries to breathe deep. Feels short of breath. Denies fever. Wants to know if he should try an inhaler or what do you recommend for the cough?

## 2017-01-15 NOTE — Telephone Encounter (Signed)
I would recommend that he follow up in the office tomorrow for re-evaluation.  If worsening shortness of breath overnight or if he develops chest pain he should proceed to the ED. Ok to set up with another provider since I will be out of the office.

## 2017-01-15 NOTE — Telephone Encounter (Signed)
Please contact patient and let him know that his chest xray is clear- no pneumonia. Has he had any further fevers?  If so,how high. How is he feeling today?

## 2017-01-20 ENCOUNTER — Ambulatory Visit (INDEPENDENT_AMBULATORY_CARE_PROVIDER_SITE_OTHER): Payer: BLUE CROSS/BLUE SHIELD | Admitting: Gastroenterology

## 2017-01-20 ENCOUNTER — Encounter: Payer: Self-pay | Admitting: Gastroenterology

## 2017-01-20 VITALS — BP 116/88 | HR 100 | Ht 68.5 in | Wt 203.1 lb

## 2017-01-20 DIAGNOSIS — G8929 Other chronic pain: Secondary | ICD-10-CM

## 2017-01-20 DIAGNOSIS — Z8719 Personal history of other diseases of the digestive system: Secondary | ICD-10-CM | POA: Insufficient documentation

## 2017-01-20 DIAGNOSIS — R1013 Epigastric pain: Secondary | ICD-10-CM | POA: Diagnosis not present

## 2017-01-20 MED ORDER — OMEPRAZOLE 40 MG PO CPDR: DELAYED_RELEASE_CAPSULE | ORAL | 2 refills | Status: DC

## 2017-01-20 MED ORDER — DICYCLOMINE HCL 10 MG PO CAPS: ORAL_CAPSULE | ORAL | 2 refills | Status: DC

## 2017-01-20 NOTE — Patient Instructions (Addendum)
You have been scheduled for an endoscopy. Please follow written instructions given to you at your visit today. If you use inhalers (even only as needed), please bring them with you on the day of your procedure. Your physician has requested that you go to www.startemmi.com and enter the access code given to you at your visit today. This web site gives a general overview about your procedure. However, you should still follow specific instructions given to you by our office regarding your preparation for the procedure.  We have sent the following medications to your pharmacy for you to pick up at your convenience: [place].  1. Bentyl ( dicyclomine) 10 mg 2. Omeprazoe 40 mg twice daily.

## 2017-01-20 NOTE — Progress Notes (Signed)
01/20/2017 Todd Novak [id number] Aug 11, 1965   HISTORY OF PRESENT ILLNESS:  This is a 51 year old male who was seen here in October 2016 by one of our physician assistants with complaints of epigastric abdominal pain. He was scheduled for an EGD, but never had that performed.He tells me that this pain has been present for about 2 years but has definitely worsened in intensity and frequency. He says that it is very extreme pain and doubles him over at times. He describes it as a tightness or cramping and causes him to sweat. He says that he did have cardiac evaluation and it was unremarkable.  CT scan of the abdomen and pelvis with contrast performed just last week did not show any explanation for his symptoms. He does not have a gallbladder. Recent CBC, CMP, amylase, lipase unremarkable except for significantly elevated white blood cell count consistent with his CLL.  He is on omeprazole 40 mg daily and he feels that that helped significantly with his acid reflux symptoms that he had dealt with previously. He denies any dysphagia or odynophagia.  Last colonoscopy October 2011 at which time he was found have changes consistent with ischemic colitis in the left colon as well as some erythema at the distal edge of the IC valve. Also had internal hemorrhoids. Biopsies of the left colon showed mucosal edema with superficial erosion consistent with early ischemic colitis. IC valve biopsy showed mild edema with benign lymphoid nodules. EGD at that time showed multiple small stomach polyps that were biopsied and showed multiple benign fundic gland polyps, negative for H. Pylori.  This EGD was performed for complaints of epigastric pain.  I also have the pathology from October 2014 it which time biopsies of the esophagus showed Barrett's esophagus without dysplasia. It was recommended he have a repeat EGD in 3 years from that time.    Past Medical History:  Diagnosis Date  . Asthma    childhood,  resolved  . CLL (chronic lymphocytic leukemia) ([place])   . Environmental allergies   . Fatigue   . GERD (gastroesophageal reflux disease)   . Heart murmur   . HTN (hypertension)    Past Surgical History:  Procedure Laterality Date  . APPENDECTOMY    . CHOLECYSTECTOMY    . WISDOM TOOTH EXTRACTION      reports that he has never smoked. He has never used smokeless tobacco. He reports that he drinks about 1.8 oz of alcohol per week . He reports that he does not use drugs. family history includes Healthy in his daughter, sister, and son; Hypertension in his maternal grandfather, maternal grandmother, and mother; Hypertension (age of onset: 63) in his father. Allergies  Allergen Reactions  . Methocarbamol Nausea Only    dizziness  . Tramadol Nausea And Vomiting      Outpatient Encounter Prescriptions as of 01/20/2017  Medication Sig  . acetaminophen (TYLENOL) 325 MG tablet Take 650 mg by mouth as needed.  Marland Kitchen albuterol (PROVENTIL HFA;VENTOLIN HFA) 108 (90 Base) MCG/ACT inhaler Inhale 2 puffs into the lungs every 6 (six) hours as needed for wheezing or shortness of breath.  . Ascorbic Acid (VITAMIN C) 100 MG tablet Take 100 mg by mouth daily.  Marland Kitchen aspirin 81 MG tablet Take 81 mg by mouth daily as needed.   . fexofenadine (ALLEGRA) 180 MG tablet Take 1 tablet (180 mg total) by mouth daily.  Marland Kitchen lisinopril (PRINIVIL,ZESTRIL) 20 MG tablet TAKE 1 TABLET(20 MG) BY MOUTH DAILY  .  LYSINE PO Take 1 tablet by mouth daily.  . Multiple Vitamin (MULTIVITAMIN) tablet Take 1 tablet by mouth daily.  Marland Kitchen omeprazole (PRILOSEC) 40 MG capsule Take 1 capsule (40 mg total) by mouth daily.  . valACYclovir (VALTREX) 1000 MG tablet 2 tabs at start of cold sore followed by 2 tabs 12 hours later.  . vitamin E 400 UNIT capsule Take 400 Units by mouth daily.   No facility-administered encounter medications on file as of 01/20/2017.      REVIEW OF SYSTEMS  : All other systems reviewed and negative except where noted in  the History of Present Illness.   PHYSICAL EXAM: BP 116/88   Pulse 100   Ht 5' 8.5" (1.74 m)   Wt 203 lb 2 oz (92.1 kg)   BMI 30.44 kg/m  General: Well developed white male in no acute distress Head: Normocephalic and atraumatic Eyes:  Sclerae anicteric, conjunctiva pink. Ears: Normal auditory acuity Lungs: Clear throughout to auscultation; no increased WOB. Heart: Regular rate and rhythm Abdomen: Soft, non-distended. Normal bowel sounds.  Epigastric TTP. Musculoskeletal: Symmetrical with no gross deformities  Skin: No lesions on visible extremities Extremities: No edema  Neurological: Alert oriented x 4, grossly non-focal Psychological:  Alert and cooperative. Normal mood and affect  ASSESSMENT AND PLAN: -Epigastric abdominal/chest pain:  Intermittent for 2 years but worsening recently, more frequent.  Severe.  Cardiac evaluation unremarkable.  Recent CT scan unremarkable.  Was scheduled for EGD when he was seen here in 04/2015 but never had that performed.  Will schedule again with Dr. Silverio Decamp.  I am also going to have him increase his omeprazole to 40 mg twice daily and have him try Bentyl 10 mg twice daily. -History of Barrett's esophagus:  This was mentioned on biopsy results from EGD in 04/2013.  Will evaluate with EGD.  *The risks, benefits, and alternatives to EGD were discussed with the patient and he consents to proceed.   CC:  Debbrah Alar, NP

## 2017-01-21 NOTE — Progress Notes (Signed)
Reviewed and agree with documentation and assessment and plan. K. Veena Elmore Hyslop , MD   

## 2017-01-26 ENCOUNTER — Ambulatory Visit (AMBULATORY_SURGERY_CENTER): Payer: BLUE CROSS/BLUE SHIELD | Admitting: Gastroenterology

## 2017-01-26 ENCOUNTER — Encounter: Payer: Self-pay | Admitting: Gastroenterology

## 2017-01-26 VITALS — BP 115/63 | HR 71 | Temp 98.2°F | Resp 22 | Ht 68.5 in | Wt 203.0 lb

## 2017-01-26 DIAGNOSIS — K2270 Barrett's esophagus without dysplasia: Secondary | ICD-10-CM | POA: Diagnosis present

## 2017-01-26 DIAGNOSIS — R1013 Epigastric pain: Secondary | ICD-10-CM | POA: Diagnosis not present

## 2017-01-26 MED ORDER — SODIUM CHLORIDE 0.9 % IV SOLN: 500.0000 mL | INTRAVENOUS | Status: DC

## 2017-01-26 NOTE — Progress Notes (Signed)
Pt's states no medical or surgical changes since previsit or office visit. 

## 2017-01-26 NOTE — Patient Instructions (Signed)
Handout given on Barrett's esophagus   YOU HAD AN ENDOSCOPIC PROCEDURE TODAY: Refer to the procedure report and other information in the discharge instructions given to you for any specific questions about what was found during the examination. If this information does not answer your questions, please call [place] office at [phone] to clarify.   YOU SHOULD EXPECT: Some feelings of bloating in the abdomen. Passage of more gas than usual. Walking can help get rid of the air that was put into your GI tract during the procedure and reduce the bloating. If you had a lower endoscopy (such as a colonoscopy or flexible sigmoidoscopy) you may notice spotting of blood in your stool or on the toilet paper. Some abdominal soreness may be present for a day or two, also.  DIET: Your first meal following the procedure should be a light meal and then it is ok to progress to your normal diet. A half-sandwich or bowl of soup is an example of a good first meal. Heavy or fried foods are harder to digest and may make you feel nauseous or bloated. Drink plenty of fluids but you should avoid alcoholic beverages for 24 hours. If you had a esophageal dilation, please see attached instructions for diet.    ACTIVITY: Your care partner should take you home directly after the procedure. You should plan to take it easy, moving slowly for the rest of the day. You can resume normal activity the day after the procedure however YOU SHOULD NOT DRIVE, use power tools, machinery or perform tasks that involve climbing or major physical exertion for 24 hours (because of the sedation medicines used during the test).   SYMPTOMS TO REPORT IMMEDIATELY: A gastroenterologist can be reached at any hour. Please call [phone]  for any of the following symptoms:   Following upper endoscopy (EGD, EUS, ERCP, esophageal dilation) Vomiting of blood or coffee ground material  New, significant abdominal pain  New, significant chest pain or  pain under the shoulder blades  Painful or persistently difficult swallowing  New shortness of breath  Black, tarry-looking or red, bloody stools  FOLLOW UP:  If any biopsies were taken you will be contacted by phone or by letter within the next 1-3 weeks. Call [phone]  if you have not heard about the biopsies in 3 weeks.  Please also call with any specific questions about appointments or follow up tests.

## 2017-01-26 NOTE — Op Note (Signed)
[place] Patient Name: Todd Novak Procedure Date: 01/26/2017 11:20 AM MRN: [id number] Endoscopist: Mauri Pole , MD Age: 51 Referring MD:  Date of Birth: [birth year] Gender: Male Account #: [id number] Procedure:                Upper GI endoscopy Indications:              Surveillance for malignancy due to personal history                            of Barrett's esophagus, Epigastric abdominal pain Medicines:                Monitored Anesthesia Care Procedure:                Pre-Anesthesia Assessment:                           - Prior to the procedure, a History and Physical                            was performed, and patient medications and                            allergies were reviewed. The patient's tolerance of                            previous anesthesia was also reviewed. The risks                            and benefits of the procedure and the sedation                            options and risks were discussed with the patient.                            All questions were answered, and informed consent                            was obtained. Prior Anticoagulants: The patient has                            taken no previous anticoagulant or antiplatelet                            agents. ASA Grade Assessment: II - A patient with                            mild systemic disease. After reviewing the risks                            and benefits, the patient was deemed in                            satisfactory condition to undergo the procedure.  After obtaining informed consent, the endoscope was                            passed under direct vision. Throughout the                            procedure, the patient's blood pressure, pulse, and                            oxygen saturations were monitored continuously. The                            Endoscope was introduced through the mouth, and   advanced to the second part of duodenum. The upper                            GI endoscopy was accomplished without difficulty.                            The patient tolerated the procedure well. Scope In: Scope Out: Findings:                 The esophagus and gastroesophageal junction were                            examined with white light and narrow band imaging                            (NBI) from a forward view and retroflexed position.                            There were esophageal mucosal changes secondary to                            established short-segment Barrett's disease. These                            changes involved the mucosa at the upper extent of                            the gastric folds (40 cm from the incisors)                            extending to the Z-line (39 cm from the incisors).                            Three tongues of salmon-colored mucosa were present                            from 39 to 40 cm. The maximum longitudinal extent                            of these esophageal mucosal changes was 1 cm in  length. Mucosa was biopsied with a cold forceps for                            histology in a targeted manner at intervals of 1 cm                            from 39 to 40 cm from the incisors. One specimen                            bottle was sent to pathology.                           A few less than 5 mm sessile polyps with no                            stigmata of recent bleeding were found in the                            gastric fundus.                           The cardia and gastric fundus were normal on                            retroflexion.                           The examined duodenum was normal. Complications:            No immediate complications. Estimated Blood Loss:     Estimated blood loss was minimal. Impression:               - Esophageal mucosal changes secondary to                             established short-segment Barrett's disease.                            Biopsied.                           - A few gastric polyps.                           - Normal examined duodenum. Recommendation:           - Patient has a contact number available for                            emergencies. The signs and symptoms of potential                            delayed complications were discussed with the                            patient. Return to normal activities tomorrow.  Written discharge instructions were provided to the                            patient.                           - Resume previous diet.                           - Continue present medications.                           - Await pathology results.                           - Repeat upper endoscopy after studies are complete                            for surveillance based on pathology results.                           - Return to GI clinic PRN.                           - Follow an antireflux regimen. Mauri Pole, MD 01/26/2017 11:33:47 AM This report has been signed electronically.

## 2017-01-26 NOTE — Progress Notes (Signed)
Called to room to assist during endoscopic procedure.  Patient ID and intended procedure confirmed with present staff. Received instructions for my participation in the procedure from the performing physician.  

## 2017-01-26 NOTE — Progress Notes (Signed)
To recovery, report to Thomas, RN, VSS 

## 2017-01-27 ENCOUNTER — Telehealth: Payer: Self-pay | Admitting: *Deleted

## 2017-01-27 NOTE — Telephone Encounter (Signed)
  Follow up Call-  Call back number 01/26/2017  Post procedure Call Back phone  # [phone]  Permission to leave phone message Yes  Some recent data might be hidden     Patient questions:  Do you have a fever, pain , or abdominal swelling? No. Pain Score  0 *  Have you tolerated food without any problems? Yes.    Have you been able to return to your normal activities? Yes.    Do you have any questions about your discharge instructions: Diet   No. Medications  No. Follow up visit  No.  Do you have questions or concerns about your Care? No.  Actions: * If pain score is 4 or above: No action needed, pain <4.

## 2017-02-03 ENCOUNTER — Encounter: Payer: Self-pay | Admitting: Gastroenterology

## 2017-02-12 ENCOUNTER — Telehealth: Payer: Self-pay | Admitting: *Deleted

## 2017-02-12 NOTE — Telephone Encounter (Signed)
.    I had tried to do a Prior Authorizaion through [employer].  I have had trouble with the insurance information.  The patient was last seen here 01-20-2017.  I used the insurance information the patient gave [place] on that day. [employer] .  I got a response that the patient is inactive.  I was informed today, 02-12-2017 from [place], [place] , that the patient had a savings card and used it for the Omeprazole 60 mg capsules script he got from [place].  He has picked up the prescription.

## 2017-02-13 ENCOUNTER — Telehealth: Payer: Self-pay | Admitting: *Deleted

## 2017-02-13 NOTE — Telephone Encounter (Signed)
Despite the trouble I had doing the Prior Authorization for the Omeprazole 40 mg, I did get the approval faxed to me from [facility].  The patient did already pick up the prescription. He used a savings card he was given.  Sent the Approval letter, stamped and dated to be scanned.

## 2017-04-30 ENCOUNTER — Encounter: Payer: Self-pay | Admitting: Family Medicine

## 2017-04-30 ENCOUNTER — Ambulatory Visit (HOSPITAL_BASED_OUTPATIENT_CLINIC_OR_DEPARTMENT_OTHER)
Admission: RE | Admit: 2017-04-30 | Discharge: 2017-04-30 | Disposition: A | Discharge status: Home / Self Care | Payer: BLUE CROSS/BLUE SHIELD | Source: Ambulatory Visit | Attending: Family Medicine | Admitting: Family Medicine

## 2017-04-30 ENCOUNTER — Other Ambulatory Visit: Payer: Self-pay | Admitting: Family Medicine

## 2017-04-30 ENCOUNTER — Ambulatory Visit (INDEPENDENT_AMBULATORY_CARE_PROVIDER_SITE_OTHER): Payer: BLUE CROSS/BLUE SHIELD | Admitting: Family Medicine

## 2017-04-30 VITALS — BP 122/80 | HR 105 | Temp 99.4°F | Ht 68.5 in | Wt 199.2 lb

## 2017-04-30 DIAGNOSIS — M25512 Pain in left shoulder: Secondary | ICD-10-CM

## 2017-04-30 MED ORDER — NAPROXEN 500 MG PO TABS: 500.0000 mg | ORAL_TABLET | Freq: Two times a day (BID) | ORAL | 0 refills | Status: DC

## 2017-04-30 MED ORDER — METHYLPREDNISOLONE ACETATE 40 MG/ML IJ SUSP
40.0000 mg | Freq: Once | INTRAMUSCULAR | Status: AC
  Administered 2017-04-30: 40 mg via INTRAMUSCULAR

## 2017-04-30 MED ORDER — CYCLOBENZAPRINE HCL 10 MG PO TABS: 10.0000 mg | ORAL_TABLET | Freq: Three times a day (TID) | ORAL | 0 refills | Status: DC | PRN

## 2017-04-30 MED ORDER — VALACYCLOVIR HCL 1 G PO TABS: ORAL_TABLET | ORAL | 5 refills | Status: DC

## 2017-04-30 NOTE — Progress Notes (Signed)
Pre visit review using our clinic review tool, if applicable. No additional management support is needed unless otherwise documented below in the visit note. 

## 2017-04-30 NOTE — Patient Instructions (Addendum)
Ice/cold pack over area for 10-15 min every 2-3 hours while awake.  I will send you a MyChart message with your results.   EXERCISES  RANGE OF MOTION (ROM) AND STRETCHING EXERCISES These exercises may help you when beginning to rehabilitate your injury. Your symptoms may resolve with or without further involvement from your physician, physical therapist or athletic trainer. While completing these exercises, remember:   Restoring tissue flexibility helps normal motion to return to the joints. This allows healthier, less painful movement and activity.  An effective stretch should be held for at least 30 seconds.  A stretch should never be painful. You should only feel a gentle lengthening or release in the stretched tissue.  ROM - Pendulum  Bend at the waist so that your right / left arm falls away from your body. Support yourself with your opposite hand on a solid surface, such as a table or a countertop.  Your right / left arm should be perpendicular to the ground. If it is not perpendicular, you need to lean over farther. Relax the muscles in your right / left arm and shoulder as much as possible.  Gently sway your hips and trunk so they move your right / left arm without any use of your right / left shoulder muscles.  Progress your movements so that your right / left arm moves side to side, then forward and backward, and finally, both clockwise and counterclockwise.  Complete 10-15 repetitions in each direction. Many people use this exercise to relieve discomfort in their shoulder as well as to gain range of motion. Repeat 2-3 times. Complete this exercise once per day.  STRETCH - Flexion, Standing  Stand with good posture. With an underhand grip on your right / left hand and an overhand grip on the opposite hand, grasp a broomstick or cane so that your hands are a little more than shoulder-width apart.  Keeping your right / left elbow straight and shoulder muscles relaxed, push the  stick with your opposite hand to raise your right / left arm in front of your body and then overhead. Raise your arm until you feel a stretch in your right / left shoulder, but before you have increased shoulder pain.  Try to avoid shrugging your right / left shoulder as your arm rises by keeping your shoulder blade tucked down and toward your mid-back spine. Hold 15-20 seconds.  Slowly return to the starting position. Repeat 2-3 times. Complete this exercise once per day.  STRETCH - Internal Rotation  Place your right / left hand behind your back, palm-up.  Throw a towel or belt over your opposite shoulder. Grasp the towel/belt with your right / left hand.  While keeping an upright posture, gently pull up on the towel/belt until you feel a stretch in the front of your right / left shoulder.  Avoid shrugging your right / left shoulder as your arm rises by keeping your shoulder blade tucked down and toward your mid-back spine.  Hold 15-20. Release the stretch by lowering your opposite hand. Repeat 2-3 times. Complete this exercise once per day.  STRETCH - External Rotation and Abduction  Stagger your stance through a doorframe. It does not matter which foot is forward.  As instructed by your physician, physical therapist or athletic trainer, place your hands: ? And forearms above your head and on the door frame. ? And forearms at head-height and on the door frame. ? At elbow-height and on the door frame.  Keeping your head  and chest upright and your stomach muscles tight to prevent over-extending your low-back, slowly shift your weight onto your front foot until you feel a stretch across your chest and/or in the front of your shoulders.  Hold 15-20 seconds. Shift your weight to your back foot to release the stretch. Repeat 2-3 times. Complete this stretch once per day.   STRENGTHENING EXERCISES  These exercises may help you when beginning to rehabilitate your injury. They may  resolve your symptoms with or without further involvement from your physician, physical therapist or athletic trainer. While completing these exercises, remember:   Muscles can gain both the endurance and the strength needed for everyday activities through controlled exercises.  Complete these exercises as instructed by your physician, physical therapist or athletic trainer. Progress the resistance and repetitions only as guided.  You may experience muscle soreness or fatigue, but the pain or discomfort you are trying to eliminate should never worsen during these exercises. If this pain does worsen, stop and make certain you are following the directions exactly. If the pain is still present after adjustments, discontinue the exercise until you can discuss the trouble with your clinician.  If advised by your physician, during your recovery, avoid activity or exercises which involve actions that place your right / left hand or elbow above your head or behind your back or head. These positions stress the tissues which are trying to heal.  STRENGTH - Scapular Depression and Adduction  With good posture, sit on a firm chair. Supported your arms in front of you with pillows, arm rests or a table top. Have your elbows in line with the sides of your body.  Gently draw your shoulder blades down and toward your mid-back spine. Gradually increase the tension without tensing the muscles along the top of your shoulders and the back of your neck.  Hold for 5 seconds. Slowly release the tension and relax your muscles completely before completing the next repetition.  After you have practiced this exercise, remove the arm support and complete it in standing as well as sitting. Repeat 2 times. Complete this exercise every other day.   STRENGTH - External Rotators  Secure a rubber exercise band/tubing to a fixed object so that it is at the same height as your right / left elbow when you are standing or sitting  on a firm surface.  Stand or sit so that the secured exercise band/tubing is at your side that is not injured.  Bend your elbow 90 degrees. Place a folded towel or small pillow under your right / left arm so that your elbow is a few inches away from your side.  Keeping the tension on the exercise band/tubing, pull it away from your body, as if pivoting on your elbow. Be sure to keep your body steady so that the movement is only coming from your shoulder rotating.  Hold 5 seconds. Release the tension in a controlled manner as you return to the starting position. Repeat 2 times. Complete this exercise every other day.   STRENGTH - Supraspinatus  Stand or sit with good posture. Grasp a 2-3 lb weight or an exercise band/tubing so that your hand is "thumbs-up," like when you shake hands.  Slowly lift your right / left hand from your thigh into the air, traveling about 30 degrees from straight out at your side. Lift your hand to shoulder height or as far as you can without increasing any shoulder pain. Initially, many people do not lift their  hands above shoulder height.  Avoid shrugging your right / left shoulder as your arm rises by keeping your shoulder blade tucked down and toward your mid-back spine.  Hold for 4-5 seconds. Control the descent of your hand as you slowly return to your starting position. Repeat 2 times. Complete this exercise every other day.   STRENGTH - Shoulder Extensors  Secure a rubber exercise band/tubing so that it is at the height of your shoulders when you are either standing or sitting on a firm arm-less chair.  With a thumbs-up grip, grasp an end of the band/tubing in each hand. Straighten your elbows and lift your hands straight in front of you at shoulder height. Step back away from the secured end of band/tubing until it becomes tense.  Squeezing your shoulder blades together, pull your hands down to the sides of your thighs. Do not allow your hands to go  behind you.  Hold for 5 seconds. Slowly ease the tension on the band/tubing as you reverse the directions and return to the starting position. Repeat 2 times. Complete this exercise every other day.   STRENGTH - Scapular Retractors  Secure a rubber exercise band/tubing so that it is at the height of your shoulders when you are either standing or sitting on a firm arm-less chair.  With a palm-down grip, grasp an end of the band/tubing in each hand. Straighten your elbows and lift your hands straight in front of you at shoulder height. Step back away from the secured end of band/tubing until it becomes tense.  Squeezing your shoulder blades together, draw your elbows back as you bend them. Keep your upper arm lifted away from your body throughout the exercise.  Hold 5 seconds. Slowly ease the tension on the band/tubing as you reverse the directions and return to the starting position. Repeat 2 times. Complete this exercise every other day.  STRENGTH - Scapular Depressors  Find a sturdy chair without wheels, such as a from a dining room table.  Keeping your feet on the floor, lift your bottom from the seat and lock your elbows.  Keeping your elbows straight, allow gravity to pull your body weight down. Your shoulders will rise toward your ears.  Raise your body against gravity by drawing your shoulder blades down your back, shortening the distance between your shoulders and ears. Although your feet should always maintain contact with the floor, your feet should progressively support less body weight as you get stronger.  Hold 5 seconds. In a controlled and slow manner, lower your body weight to begin the next repetition. Repeat 2 times. Complete this exercise every other day.   This information is not intended to replace advice given to you by your health care provider. Make sure you discuss any questions you have with your health care provider.  Document Released: 05/28/2005 Document  Revised: 08/04/2014 Document Reviewed: 10/26/2008 Elsevier Interactive Patient Education [employer].

## 2017-04-30 NOTE — Progress Notes (Signed)
Musculoskeletal Exam  Patient: Todd Novak DOB: 16-May-1966  DOS: 04/30/2017  SUBJECTIVE:  Chief Complaint:   Chief Complaint  Patient presents with  . Shoulder Pain    Todd Novak is a 51 y.o.  male for evaluation and treatment of L shoulder pain.   Onset:  3 days ago. He was lifting something .  Location: L shoulder Character:  sharp and stabbing  Progression of issue:  is unchanged Associated symptoms: decreased ROM due to pain, no bruising, swelling Treatment: to date has been OTC NSAIDS.   Neurovascular symptoms: no  ROS: Musculoskeletal/Extremities: +L shoulder pain Neurologic: no numbness, tingling no weakness   Past Medical History:  Diagnosis Date  . Asthma    childhood, resolved  . CLL (chronic lymphocytic leukemia) ([place])   . Environmental allergies   . Fatigue   . GERD (gastroesophageal reflux disease)   . Heart murmur   . HTN (hypertension)    Past Surgical History:  Procedure Laterality Date  . APPENDECTOMY    . CHOLECYSTECTOMY    . WISDOM TOOTH EXTRACTION     Family History  Problem Relation Age of Onset  . Hypertension Mother        Living  . Hypertension Father 32       Deceased  . Hypertension Maternal Grandmother   . Hypertension Maternal Grandfather   . Healthy Sister   . Healthy Son        x1  . Healthy Daughter        x2  . Cancer Neg Hx   . Diabetes Neg Hx   . Heart disease Neg Hx    Current Outpatient Prescriptions  Medication Sig Dispense Refill  . acetaminophen (TYLENOL) 325 MG tablet Take 650 mg by mouth as needed.    Marland Kitchen albuterol (PROVENTIL HFA;VENTOLIN HFA) 108 (90 Base) MCG/ACT inhaler Inhale 2 puffs into the lungs every 6 (six) hours as needed for wheezing or shortness of breath. 1 Inhaler 0  . Ascorbic Acid (VITAMIN C) 100 MG tablet Take 100 mg by mouth daily.    Marland Kitchen aspirin 81 MG tablet Take 81 mg by mouth daily as needed.     . dicyclomine (BENTYL) 10 MG capsule Take 1 tablet by mouth twice daily for abdominal pain,  spasms. 60 capsule 2  . fexofenadine (ALLEGRA) 180 MG tablet Take 1 tablet (180 mg total) by mouth daily. 30 tablet 5  . lisinopril (PRINIVIL,ZESTRIL) 20 MG tablet TAKE 1 TABLET(20 MG) BY MOUTH DAILY 90 tablet 1  . LYSINE PO Take 1 tablet by mouth daily.    . Multiple Vitamin (MULTIVITAMIN) tablet Take 1 tablet by mouth daily.    Marland Kitchen omeprazole (PRILOSEC) 40 MG capsule Take 1 tablet by mouth twice daily. 60 capsule 2  . valACYclovir (VALTREX) 1000 MG tablet 2 tabs at start of cold sore followed by 2 tabs 12 hours later. 4 tablet 5  . vitamin E 400 UNIT capsule Take 400 Units by mouth daily.      Objective: VITAL SIGNS: BP 122/80 (BP Location: Right Arm, Patient Position: Sitting, Cuff Size: Normal)   Pulse (!) 105   Temp 99.4 F (37.4 C) (Oral)   Ht 5' 8.5" (1.74 m)   Wt 199 lb 4 oz (90.4 kg)   SpO2 99%   BMI 29.86 kg/m  Constitutional: Well formed, well developed. No acute distress. Cardiovascular: Brisk cap refill Thorax & Lungs: No accessory muscle use Extremities: No clubbing. No cyanosis. No edema.  Skin: Warm. Dry. No erythema.  No rash.  Musculoskeletal: L shoulder.   Normal active range of motion: no.   Normal passive range of motion: no Tenderness to palpation: yes Deformity: no Ecchymosis: no PE on shoulder very limited 2/2 pain. Testing equivocal at best.  Neurologic: Normal sensory function. No focal deficits noted. DTR's equal and symmetry in LE's. No clonus. Psychiatric: Normal mood. Age appropriate judgment and insight. Alert & oriented x 3.    Procedure Note; Shoulder joint injection Informed consent obtained. The acromion process palpated and marked inferior to step off laterally. It was cleaned with alcohol and freeze spray was used for topical anesthesia. A 27-gauge needle, while aiming towards the coracoid process, was used to enter the joint posteriorally with ease. 40 mg of Depomedrol with 2 mL of 2% lidocaine was injected. The patient tolerated the  procedure well. There were no complications noted. He reported some immediate improvement.  Assessment:  Acute pain of left shoulder - Plan: valACYclovir (VALTREX) 1000 MG tablet, cyclobenzaprine (FLEXERIL) 10 MG tablet, naproxen (NAPROSYN) 500 MG tablet, DG Shoulder Left, methylPREDNISolone acetate (DEPO-MEDROL) injection 40 mg, PR DRAIN/INJECT LARGE JOINT/BURSA  Plan: Orders as above. XR to r/o impingement. Home stretches/exercises given- do what he can tolerate. Ice.  F/u pending above. We may need to get him into a specialist if he does not have much benefit from the injection and if the Xray is untelling. Given previous intolerance of opioid medication, we are unable to use that as an option. The patient voiced understanding and agreement to the plan.   [place], DO 04/30/17  7:14 PM

## 2017-05-01 ENCOUNTER — Telehealth: Payer: Self-pay | Admitting: Family Medicine

## 2017-05-01 NOTE — Telephone Encounter (Signed)
Patients wife informed of results/instructions. She verbalized understanding

## 2017-05-01 NOTE — Telephone Encounter (Signed)
X-ray is normal. This is good news, I was worried there could be impingement. Let me know on Monday if things are not improving as we had hoped. Ty.

## 2017-05-01 NOTE — Telephone Encounter (Signed)
Caller name: Antony Madura Relation to pt: spouse Call back number: spouse tel [phone] Pharmacy:  Reason for call: Pt's spouse would like to know xrays results that was done yesterday afternoon. Please advise. (Spouse states can call pt but if he is sleeping will not answer ok to call her). Please advise.

## 2017-05-03 ENCOUNTER — Inpatient Hospital Stay (HOSPITAL_BASED_OUTPATIENT_CLINIC_OR_DEPARTMENT_OTHER): Payer: BLUE CROSS/BLUE SHIELD

## 2017-05-03 ENCOUNTER — Inpatient Hospital Stay (HOSPITAL_BASED_OUTPATIENT_CLINIC_OR_DEPARTMENT_OTHER)
Admission: EM | Admit: 2017-05-03 | Discharge: 2017-05-08 | DRG: 871 | Disposition: A | Discharge status: Home Health | Payer: BLUE CROSS/BLUE SHIELD | Attending: Internal Medicine | Admitting: Internal Medicine

## 2017-05-03 ENCOUNTER — Encounter (HOSPITAL_BASED_OUTPATIENT_CLINIC_OR_DEPARTMENT_OTHER): Payer: Self-pay | Admitting: Emergency Medicine

## 2017-05-03 ENCOUNTER — Emergency Department (HOSPITAL_BASED_OUTPATIENT_CLINIC_OR_DEPARTMENT_OTHER): Payer: BLUE CROSS/BLUE SHIELD

## 2017-05-03 DIAGNOSIS — C9110 Chronic lymphocytic leukemia of B-cell type not having achieved remission: Secondary | ICD-10-CM | POA: Diagnosis present

## 2017-05-03 DIAGNOSIS — A419 Sepsis, unspecified organism: Secondary | ICD-10-CM | POA: Diagnosis not present

## 2017-05-03 DIAGNOSIS — Z79899 Other long term (current) drug therapy: Secondary | ICD-10-CM

## 2017-05-03 DIAGNOSIS — I340 Nonrheumatic mitral (valve) insufficiency: Secondary | ICD-10-CM | POA: Diagnosis not present

## 2017-05-03 DIAGNOSIS — Z888 Allergy status to other drugs, medicaments and biological substances status: Secondary | ICD-10-CM | POA: Diagnosis not present

## 2017-05-03 DIAGNOSIS — M25512 Pain in left shoulder: Secondary | ICD-10-CM | POA: Diagnosis present

## 2017-05-03 DIAGNOSIS — R05 Cough: Secondary | ICD-10-CM | POA: Diagnosis not present

## 2017-05-03 DIAGNOSIS — D693 Immune thrombocytopenic purpura: Secondary | ICD-10-CM | POA: Diagnosis present

## 2017-05-03 DIAGNOSIS — K219 Gastro-esophageal reflux disease without esophagitis: Secondary | ICD-10-CM | POA: Diagnosis present

## 2017-05-03 DIAGNOSIS — J189 Pneumonia, unspecified organism: Secondary | ICD-10-CM | POA: Diagnosis not present

## 2017-05-03 DIAGNOSIS — I350 Nonrheumatic aortic (valve) stenosis: Secondary | ICD-10-CM | POA: Diagnosis not present

## 2017-05-03 DIAGNOSIS — R5081 Fever presenting with conditions classified elsewhere: Secondary | ICD-10-CM | POA: Diagnosis not present

## 2017-05-03 DIAGNOSIS — M7582 Other shoulder lesions, left shoulder: Secondary | ICD-10-CM | POA: Diagnosis not present

## 2017-05-03 DIAGNOSIS — M67814 Other specified disorders of tendon, left shoulder: Secondary | ICD-10-CM | POA: Diagnosis present

## 2017-05-03 DIAGNOSIS — R011 Cardiac murmur, unspecified: Secondary | ICD-10-CM | POA: Diagnosis not present

## 2017-05-03 DIAGNOSIS — M25519 Pain in unspecified shoulder: Secondary | ICD-10-CM | POA: Diagnosis not present

## 2017-05-03 DIAGNOSIS — Z791 Long term (current) use of non-steroidal anti-inflammatories (NSAID): Secondary | ICD-10-CM

## 2017-05-03 DIAGNOSIS — R0902 Hypoxemia: Secondary | ICD-10-CM | POA: Diagnosis not present

## 2017-05-03 DIAGNOSIS — Z8249 Family history of ischemic heart disease and other diseases of the circulatory system: Secondary | ICD-10-CM

## 2017-05-03 DIAGNOSIS — I10 Essential (primary) hypertension: Secondary | ICD-10-CM | POA: Diagnosis not present

## 2017-05-03 DIAGNOSIS — D72820 Lymphocytosis (symptomatic): Secondary | ICD-10-CM | POA: Diagnosis not present

## 2017-05-03 DIAGNOSIS — C9190 Lymphoid leukemia, unspecified not having achieved remission: Secondary | ICD-10-CM | POA: Diagnosis not present

## 2017-05-03 DIAGNOSIS — B951 Streptococcus, group B, as the cause of diseases classified elsewhere: Secondary | ICD-10-CM | POA: Diagnosis not present

## 2017-05-03 DIAGNOSIS — R740 Nonspecific elevation of levels of transaminase and lactic acid dehydrogenase [LDH]: Secondary | ICD-10-CM | POA: Diagnosis not present

## 2017-05-03 DIAGNOSIS — A401 Sepsis due to streptococcus, group B: Principal | ICD-10-CM | POA: Diagnosis present

## 2017-05-03 DIAGNOSIS — R7881 Bacteremia: Secondary | ICD-10-CM | POA: Diagnosis not present

## 2017-05-03 LAB — D-DIMER, QUANTITATIVE: D-Dimer, Quant: 1.87 ug/mL-FEU — ABNORMAL HIGH (ref 0.00–0.50)

## 2017-05-03 LAB — CBC WITH DIFFERENTIAL/PLATELET
Basophils Absolute: 0 10*3/uL (ref 0.0–0.1)
Basophils Relative: 0 %
Eosinophils Absolute: 0 10*3/uL (ref 0.0–0.7)
Eosinophils Relative: 0 %
HCT: 38.1 % — ABNORMAL LOW (ref 39.0–52.0)
Hemoglobin: 13.1 g/dL (ref 13.0–17.0)
Lymphocytes Relative: 67 %
Lymphs Abs: 7.8 10*3/uL — ABNORMAL HIGH (ref 0.7–4.0)
MCH: 31.3 pg (ref 26.0–34.0)
MCHC: 34.4 g/dL (ref 30.0–36.0)
MCV: 91.1 fL (ref 78.0–100.0)
Monocytes Absolute: 0.7 10*3/uL (ref 0.1–1.0)
Monocytes Relative: 6 %
Neutro Abs: 3.2 10*3/uL (ref 1.7–7.7)
Neutrophils Relative %: 27 %
Platelets: 127 10*3/uL — ABNORMAL LOW (ref 150–400)
RBC: 4.18 MIL/uL — ABNORMAL LOW (ref 4.22–5.81)
RDW: 13.6 % (ref 11.5–15.5)
WBC: 11.7 10*3/uL — ABNORMAL HIGH (ref 4.0–10.5)

## 2017-05-03 LAB — URINALYSIS, ROUTINE W REFLEX MICROSCOPIC
Bilirubin Urine: NEGATIVE
Glucose, UA: NEGATIVE mg/dL
Ketones, ur: NEGATIVE mg/dL
Leukocytes, UA: NEGATIVE
Nitrite: NEGATIVE
Protein, ur: 30 mg/dL — AB
Specific Gravity, Urine: 1.02 (ref 1.005–1.030)
pH: 6 (ref 5.0–8.0)

## 2017-05-03 LAB — MRSA PCR SCREENING: MRSA by PCR: NEGATIVE

## 2017-05-03 LAB — COMPREHENSIVE METABOLIC PANEL
ALT: 54 U/L (ref 17–63)
AST: 43 U/L — ABNORMAL HIGH (ref 15–41)
Albumin: 3.5 g/dL (ref 3.5–5.0)
Alkaline Phosphatase: 108 U/L (ref 38–126)
Anion gap: 9 (ref 5–15)
BUN: 15 mg/dL (ref 6–20)
CO2: 23 mmol/L (ref 22–32)
Calcium: 9 mg/dL (ref 8.9–10.3)
Chloride: 98 mmol/L — ABNORMAL LOW (ref 101–111)
Creatinine, Ser: 0.94 mg/dL (ref 0.61–1.24)
GFR calc Af Amer: >60 mL/min (ref >60)
GFR calc non Af Amer: >60 mL/min (ref >60)
Glucose, Bld: 154 mg/dL — ABNORMAL HIGH (ref 65–99)
Potassium: 3.9 mmol/L (ref 3.5–5.1)
Sodium: 130 mmol/L — ABNORMAL LOW (ref 135–145)
Total Bilirubin: 0.7 mg/dL (ref 0.3–1.2)
Total Protein: 7.2 g/dL (ref 6.5–8.1)

## 2017-05-03 LAB — I-STAT CG4 LACTIC ACID, ED: Lactic Acid, Venous: 1.18 mmol/L (ref 0.5–1.9)

## 2017-05-03 LAB — INFLUENZA PANEL BY PCR (TYPE A & B)
Influenza A By PCR: NEGATIVE
Influenza B By PCR: NEGATIVE

## 2017-05-03 LAB — TROPONIN I: Troponin I: <0.03 ng/mL (ref <0.03)

## 2017-05-03 LAB — URINALYSIS, MICROSCOPIC (REFLEX)

## 2017-05-03 LAB — PROTIME-INR
INR: 1.16
Prothrombin Time: 14.7 seconds (ref 11.4–15.2)

## 2017-05-03 MED ORDER — ONDANSETRON HCL 4 MG PO TABS: 4.0000 mg | ORAL_TABLET | Freq: Four times a day (QID) | ORAL | Status: DC | PRN

## 2017-05-03 MED ORDER — ONDANSETRON HCL 4 MG/2ML IJ SOLN
4.0000 mg | Freq: Once | INTRAMUSCULAR | Status: AC
  Administered 2017-05-03: 4 mg via INTRAVENOUS
  Filled 2017-05-03: qty 2

## 2017-05-03 MED ORDER — ORAL CARE MOUTH RINSE
15.0000 mL | Freq: Two times a day (BID) | OROMUCOSAL | Status: DC
  Administered 2017-05-03 – 2017-05-08 (×8): 15 mL via OROMUCOSAL

## 2017-05-03 MED ORDER — KETOROLAC TROMETHAMINE 30 MG/ML IJ SOLN
30.0000 mg | Freq: Once | INTRAMUSCULAR | Status: AC
  Administered 2017-05-03: 30 mg via INTRAVENOUS
  Filled 2017-05-03: qty 1

## 2017-05-03 MED ORDER — IPRATROPIUM-ALBUTEROL 0.5-2.5 (3) MG/3ML IN SOLN
3.0000 mL | Freq: Four times a day (QID) | RESPIRATORY_TRACT | Status: DC
  Administered 2017-05-03 (×2): 3 mL via RESPIRATORY_TRACT
  Filled 2017-05-03 (×2): qty 3

## 2017-05-03 MED ORDER — IOPAMIDOL (ISOVUE-370) INJECTION 76%
100.0000 mL | Freq: Once | INTRAVENOUS | Status: AC | PRN
  Administered 2017-05-03: 100 mL via INTRAVENOUS

## 2017-05-03 MED ORDER — ACETAMINOPHEN 650 MG RE SUPP
650.0000 mg | Freq: Four times a day (QID) | RECTAL | Status: DC | PRN
Start: 2017-05-03 — End: 2017-05-04

## 2017-05-03 MED ORDER — ACETAMINOPHEN 325 MG PO TABS
650.0000 mg | ORAL_TABLET | Freq: Four times a day (QID) | ORAL | Status: DC | PRN
Start: 2017-05-03 — End: 2017-05-04

## 2017-05-03 MED ORDER — DEXTROSE 5 % IV SOLN
500.0000 mg | Freq: Once | INTRAVENOUS | Status: AC
  Administered 2017-05-03: 500 mg via INTRAVENOUS
  Filled 2017-05-03: qty 500

## 2017-05-03 MED ORDER — SODIUM CHLORIDE 0.9 % IV BOLUS (SEPSIS)
1000.0000 mL | Freq: Once | INTRAVENOUS | Status: AC
  Administered 2017-05-03: 1000 mL via INTRAVENOUS

## 2017-05-03 MED ORDER — PANTOPRAZOLE SODIUM 40 MG PO TBEC
40.0000 mg | DELAYED_RELEASE_TABLET | Freq: Every day | ORAL | Status: DC
  Administered 2017-05-03 – 2017-05-08 (×6): 40 mg via ORAL
  Filled 2017-05-03 (×6): qty 1

## 2017-05-03 MED ORDER — MORPHINE SULFATE (PF) 4 MG/ML IV SOLN
4.0000 mg | Freq: Once | INTRAVENOUS | Status: AC
  Administered 2017-05-03: 4 mg via INTRAVENOUS
  Filled 2017-05-03: qty 1

## 2017-05-03 MED ORDER — ACETAMINOPHEN 325 MG PO TABS
650.0000 mg | ORAL_TABLET | Freq: Once | ORAL | Status: AC | PRN
  Administered 2017-05-03: 650 mg via ORAL
  Filled 2017-05-03: qty 2

## 2017-05-03 MED ORDER — DEXTROSE 5 % IV SOLN: 1.0000 g | INTRAVENOUS | Status: DC

## 2017-05-03 MED ORDER — DEXTROSE 5 % IV SOLN
500.0000 mg | INTRAVENOUS | Status: DC
  Administered 2017-05-04: 500 mg via INTRAVENOUS
  Filled 2017-05-03: qty 500

## 2017-05-03 MED ORDER — DICYCLOMINE HCL 10 MG PO CAPS
10.0000 mg | ORAL_CAPSULE | Freq: Three times a day (TID) | ORAL | Status: DC
  Administered 2017-05-04 – 2017-05-08 (×9): 10 mg via ORAL
  Filled 2017-05-03 (×12): qty 1

## 2017-05-03 MED ORDER — ONDANSETRON HCL 4 MG/2ML IJ SOLN: 4.0000 mg | Freq: Four times a day (QID) | INTRAMUSCULAR | Status: DC | PRN

## 2017-05-03 MED ORDER — IPRATROPIUM-ALBUTEROL 0.5-2.5 (3) MG/3ML IN SOLN: 3.0000 mL | Freq: Four times a day (QID) | RESPIRATORY_TRACT | Status: DC | PRN

## 2017-05-03 MED ORDER — VITAMIN E 180 MG (400 UNIT) PO CAPS
400.0000 [IU] | ORAL_CAPSULE | Freq: Every day | ORAL | Status: DC
  Administered 2017-05-03 – 2017-05-08 (×6): 400 [IU] via ORAL
  Filled 2017-05-03 (×6): qty 1

## 2017-05-03 MED ORDER — HYDROMORPHONE HCL 1 MG/ML IJ SOLN
1.0000 mg | Freq: Once | INTRAMUSCULAR | Status: AC
  Administered 2017-05-03: 1 mg via INTRAVENOUS
  Filled 2017-05-03: qty 1

## 2017-05-03 MED ORDER — DEXTROSE 5 % IV SOLN
1.0000 g | Freq: Once | INTRAVENOUS | Status: AC
  Administered 2017-05-03: 1 g via INTRAVENOUS
  Filled 2017-05-03: qty 10

## 2017-05-03 MED ORDER — KETOROLAC TROMETHAMINE 15 MG/ML IJ SOLN
15.0000 mg | Freq: Four times a day (QID) | INTRAMUSCULAR | Status: DC | PRN
  Administered 2017-05-03 – 2017-05-05 (×4): 15 mg via INTRAVENOUS
  Filled 2017-05-03 (×4): qty 1

## 2017-05-03 MED ORDER — VITAMIN C 250 MG PO TABS
125.0000 mg | ORAL_TABLET | Freq: Every day | ORAL | Status: DC
  Administered 2017-05-03 – 2017-05-08 (×6): 125 mg via ORAL
  Filled 2017-05-03 (×6): qty 1

## 2017-05-03 MED ORDER — IBUPROFEN 200 MG PO TABS
400.0000 mg | ORAL_TABLET | ORAL | Status: DC | PRN
  Administered 2017-05-06 – 2017-05-07 (×2): 400 mg via ORAL
  Filled 2017-05-03 (×2): qty 2

## 2017-05-03 MED ORDER — SODIUM CHLORIDE 0.9 % IV SOLN
INTRAVENOUS | Status: DC
  Administered 2017-05-03 – 2017-05-04 (×3): via INTRAVENOUS

## 2017-05-03 MED ORDER — PIPERACILLIN-TAZOBACTAM 3.375 G IVPB
3.3750 g | Freq: Three times a day (TID) | INTRAVENOUS | Status: DC
  Administered 2017-05-03: 3.375 g via INTRAVENOUS
  Filled 2017-05-03: qty 50

## 2017-05-03 MED ORDER — ALBUTEROL SULFATE (2.5 MG/3ML) 0.083% IN NEBU
3.0000 mL | INHALATION_SOLUTION | Freq: Four times a day (QID) | RESPIRATORY_TRACT | Status: DC | PRN
  Administered 2017-05-04: 3 mL via RESPIRATORY_TRACT
  Filled 2017-05-03: qty 3

## 2017-05-03 MED ORDER — DIPHENHYDRAMINE HCL 50 MG/ML IJ SOLN
12.5000 mg | Freq: Once | INTRAMUSCULAR | Status: AC
  Administered 2017-05-03: 12.5 mg via INTRAVENOUS
  Filled 2017-05-03: qty 1

## 2017-05-03 MED ORDER — ACETAMINOPHEN 325 MG PO TABS
650.0000 mg | ORAL_TABLET | ORAL | Status: DC | PRN
  Administered 2017-05-03 – 2017-05-04 (×3): 650 mg via ORAL
  Filled 2017-05-03 (×3): qty 2

## 2017-05-03 MED ORDER — VANCOMYCIN HCL 500 MG IV SOLR
500.0000 mg | Freq: Once | INTRAVENOUS | Status: AC
  Administered 2017-05-03: 500 mg via INTRAVENOUS
  Filled 2017-05-03: qty 500

## 2017-05-03 MED ORDER — ADULT MULTIVITAMIN W/MINERALS CH
1.0000 | ORAL_TABLET | Freq: Every day | ORAL | Status: DC
  Administered 2017-05-03 – 2017-05-08 (×6): 1 via ORAL
  Filled 2017-05-03 (×6): qty 1

## 2017-05-03 MED ORDER — VANCOMYCIN HCL IN DEXTROSE 1-5 GM/200ML-% IV SOLN: 1000.0000 mg | Freq: Three times a day (TID) | INTRAVENOUS | Status: DC

## 2017-05-03 MED ORDER — ENOXAPARIN SODIUM 40 MG/0.4ML ~~LOC~~ SOLN
40.0000 mg | Freq: Every day | SUBCUTANEOUS | Status: DC
  Administered 2017-05-03: 40 mg via SUBCUTANEOUS
  Filled 2017-05-03 (×4): qty 0.4

## 2017-05-03 MED ORDER — VANCOMYCIN HCL IN DEXTROSE 1-5 GM/200ML-% IV SOLN
1000.0000 mg | Freq: Once | INTRAVENOUS | Status: AC
  Administered 2017-05-03: 1000 mg via INTRAVENOUS
  Filled 2017-05-03: qty 200

## 2017-05-03 MED ORDER — NAPROXEN 500 MG PO TABS
500.0000 mg | ORAL_TABLET | Freq: Two times a day (BID) | ORAL | Status: DC
  Administered 2017-05-04 – 2017-05-08 (×10): 500 mg via ORAL
  Filled 2017-05-03: qty 1
  Filled 2017-05-03: qty 2
  Filled 2017-05-03 (×2): qty 1
  Filled 2017-05-03: qty 2
  Filled 2017-05-03 (×6): qty 1

## 2017-05-03 MED ORDER — VANCOMYCIN HCL 500 MG IV SOLR
INTRAVENOUS | Status: AC
  Filled 2017-05-03: qty 500

## 2017-05-03 MED ORDER — VITAMIN C 250 MG PO TABS
250.0000 mg | ORAL_TABLET | Freq: Every day | ORAL | Status: DC
  Filled 2017-05-03: qty 1

## 2017-05-03 MED ORDER — ASPIRIN 81 MG PO CHEW: 81.0000 mg | CHEWABLE_TABLET | Freq: Every day | ORAL | Status: DC | PRN

## 2017-05-03 MED ORDER — CYCLOBENZAPRINE HCL 10 MG PO TABS: 10.0000 mg | ORAL_TABLET | Freq: Three times a day (TID) | ORAL | Status: DC | PRN

## 2017-05-03 MED ORDER — AZITHROMYCIN 500 MG IV SOLR
INTRAVENOUS | Status: AC
  Filled 2017-05-03: qty 500

## 2017-05-03 MED ORDER — LORATADINE 10 MG PO TABS
10.0000 mg | ORAL_TABLET | Freq: Every day | ORAL | Status: DC
  Administered 2017-05-03 – 2017-05-08 (×6): 10 mg via ORAL
  Filled 2017-05-03 (×6): qty 1

## 2017-05-03 NOTE — H&P (Signed)
History and Physical    Todd Novak PXT:[id number] DOB: 07/13/1966 DOA: 05/03/2017  PCP: Debbrah Alar, NP   Patient coming from:  [facility]  Chief Complaint: Fever  HPI: Todd Novak is a 51 y.o. male with medical history significant of CLL, who presents as a transfer from [facility] due to sepsis. Patient started feeling ill about 4 days ago, mainly left shoulder pain, which has been sharp and burning in nature, 10 out of 10 in intensity, persistent and worsening, no improving factors, worse with movement, associated with generalized malaise, aches and pains and for last 24 hours fever. Patient was seen by his physician 3 days ago, workup with x-rays reveal no bony abnormalities, apparently he received a local injection with no improvement of his symptoms. Due to persistent and worsening symptoms patient was brought into the hospital for further evaluation.  About 2 weeks ago patient had an respiratory tract infection, with persistent cough that self resolved. Patient does not recall any trauma to his left shoulder. Patient had varicella zoster in his childhood, but no history of shingles.    ED Course: Patient was diagnosed with sepsis due to pneumonia, started on IV fluids, IV antibiotics and antipyretics. Transferred for further evaluation and management.   Review of Systems:  1. General: Positive fevers and chills, no weight gain or weight loss 2. ENT: No runny nose or sore throat, no hearing disturbances 3. Pulmonary: No dyspnea, cough, wheezing, or hemoptysis 4. Cardiovascular: No angina, claudication, lower extremity edema, pnd or orthopnea 5. Gastrointestinal: No nausea or vomiting, no diarrhea or constipation 6. Hematology: No easy bruisability or frequent infections 7. Urology: No dysuria, hematuria or increased urinary frequency 8. Dermatology: No rashes. 9. Neurology: No seizures or paresthesias 10. Musculoskeletal: No joint pain or deformities  Past Medical History:    Diagnosis Date  . Asthma    childhood, resolved  . CLL (chronic lymphocytic leukemia) ([place])   . Environmental allergies   . Fatigue   . GERD (gastroesophageal reflux disease)   . Heart murmur   . HTN (hypertension)     Past Surgical History:  Procedure Laterality Date  . APPENDECTOMY    . CHOLECYSTECTOMY    . WISDOM TOOTH EXTRACTION       reports that he has never smoked. He has never used smokeless tobacco. He reports that he drinks about 1.8 oz of alcohol per week . He reports that he does not use drugs.  Allergies  Allergen Reactions  . Methocarbamol Nausea Only    dizziness  . Tramadol Nausea And Vomiting    Family History  Problem Relation Age of Onset  . Hypertension Mother        Living  . Hypertension Father 58       Deceased  . Hypertension Maternal Grandmother   . Hypertension Maternal Grandfather   . Healthy Sister   . Healthy Son        x1  . Healthy Daughter        x2  . Cancer Neg Hx   . Diabetes Neg Hx   . Heart disease Neg Hx    Unacceptable: Noncontributory, unremarkable, or negative. Acceptable: Family history reviewed and not pertinent (If you reviewed it)  Prior to Admission medications   Medication Sig Start Date End Date Taking? Authorizing Provider  acetaminophen (TYLENOL) 325 MG tablet Take 650 mg by mouth as needed.    [provider]  albuterol (PROVENTIL HFA;VENTOLIN HFA) 108 (90 Base) MCG/ACT inhaler Inhale 2 puffs into  the lungs every 6 (six) hours as needed for wheezing or shortness of breath. 05/30/16   Debbrah Alar, NP  Ascorbic Acid (VITAMIN C) 100 MG tablet Take 100 mg by mouth daily.    [provider]  aspirin 81 MG tablet Take 81 mg by mouth daily as needed.     [provider]  cyclobenzaprine (FLEXERIL) 10 MG tablet Take 1 tablet (10 mg total) by mouth 3 (three) times daily as needed for muscle spasms. 04/30/17   Shelda Pal, DO  dicyclomine (BENTYL) 10 MG capsule Take 1 tablet  by mouth twice daily for abdominal pain, spasms. 01/20/17   Zehr, Laban Emperor, PA-C  fexofenadine (ALLEGRA) 180 MG tablet Take 1 tablet (180 mg total) by mouth daily. 05/30/16   Debbrah Alar, NP  lisinopril (PRINIVIL,ZESTRIL) 20 MG tablet TAKE 1 TABLET(20 MG) BY MOUTH DAILY 01/13/17   Debbrah Alar, NP  LYSINE PO Take 1 tablet by mouth daily.    [provider]  Multiple Vitamin (MULTIVITAMIN) tablet Take 1 tablet by mouth daily.    [provider]  naproxen (NAPROSYN) 500 MG tablet TAKE 1 TABLET(500 MG) BY MOUTH TWICE DAILY WITH A MEAL 05/01/17   Wendling, Crosby Oyster, DO  omeprazole (PRILOSEC) 40 MG capsule Take 1 tablet by mouth twice daily. 01/20/17   Zehr, Laban Emperor, PA-C  valACYclovir (VALTREX) 1000 MG tablet 2 tabs at start of cold sore followed by 2 tabs 12 hours later. 04/30/17   Shelda Pal, DO  vitamin E 400 UNIT capsule Take 400 Units by mouth daily.    [provider]    Physical Exam: Vitals:   05/03/17 1930 05/03/17 2000 05/03/17 2043 05/03/17 2046  BP: 114/81   132/71  Pulse: 99   96  Resp: (!) 27   (!) 29  Temp:   99 F (37.2 C)   TempSrc:   Oral   SpO2: 97%   98%  Weight:  91 kg (200 lb 9.9 oz)    Height:  5' 8.5" (1.74 m)      Constitutional: deconditioned and ill looking appearing Vitals:   05/03/17 1930 05/03/17 2000 05/03/17 2043 05/03/17 2046  BP: 114/81   132/71  Pulse: 99   96  Resp: (!) 27   (!) 29  Temp:   99 F (37.2 C)   TempSrc:   Oral   SpO2: 97%   98%  Weight:  91 kg (200 lb 9.9 oz)    Height:  5' 8.5" (1.74 m)     Eyes: PERRL, lids and conjunctivae pale with no icterus.  Head normocephalic, nose and ears no deformities.  ENMT: Mucous membranes are dry. Posterior pharynx clear of any exudate or lesions.Normal dentition.  Neck: normal, supple, no masses, no thyromegaly Respiratory: clear to auscultation bilaterally, no wheezing, no crackles. Normal respiratory effort. No accessory muscle use.   Cardiovascular: Regular rate and rhythm, tachycardic, no murmurs / rubs / gallops. No extremity edema. 2+ pedal pulses. No carotid bruits.  Abdomen: no tenderness, no masses palpated. No hepatosplenomegaly. Bowel sounds positive.  Musculoskeletal: no clubbing / cyanosis. No joint deformity upper and lower extremities. Good ROM, no contractures. Normal muscle tone. Tender to palpation left shoulder, with decrease adduction due to pain.  Skin: no rashes, lesions, ulcers. No induration. Hyperpigmentation at the left axilla, with local non pitting edema, no erythema.  Neurologic: CN 2-12 grossly intact. Sensation intact, DTR normal. Strength 5/5 in all 4.     Labs on Admission:  I have personally reviewed following labs and imaging studies  CBC:  Recent Labs Lab 05/03/17 1030  WBC 11.7*  NEUTROABS 3.2  HGB 13.1  HCT 38.1*  MCV 91.1  PLT 825*   Basic Metabolic Panel:  Recent Labs Lab 05/03/17 1030  NA 130*  K 3.9  CL 98*  CO2 23  GLUCOSE 154*  BUN 15  CREATININE 0.94  CALCIUM 9.0   GFR: Estimated Creatinine Clearance: 101.7 mL/min (by C-G formula based on SCr of 0.94 mg/dL). Liver Function Tests:  Recent Labs Lab 05/03/17 1030  AST 43*  ALT 54  ALKPHOS 108  BILITOT 0.7  PROT 7.2  ALBUMIN 3.5   No results for input(s): LIPASE, AMYLASE in the last 168 hours. No results for input(s): AMMONIA in the last 168 hours. Coagulation Profile:  Recent Labs Lab 05/03/17 1030  INR 1.16   Cardiac Enzymes:  Recent Labs Lab 05/03/17 1030  TROPONINI <0.03   BNP (last 3 results) No results for input(s): PROBNP in the last 8760 hours. HbA1C: No results for input(s): HGBA1C in the last 72 hours. CBG: No results for input(s): GLUCAP in the last 168 hours. Lipid Profile: No results for input(s): CHOL, HDL, LDLCALC, TRIG, CHOLHDL, LDLDIRECT in the last 72 hours. Thyroid Function Tests: No results for input(s): TSH, T4TOTAL, FREET4, T3FREE, THYROIDAB in the last 72  hours. Anemia Panel: No results for input(s): VITAMINB12, FOLATE, FERRITIN, TIBC, IRON, RETICCTPCT in the last 72 hours. Urine analysis:    Component Value Date/Time   COLORURINE YELLOW 05/03/2017 1445   APPEARANCEUR CLEAR 05/03/2017 1445   LABSPEC 1.020 05/03/2017 1445   PHURINE 6.0 05/03/2017 1445   GLUCOSEU NEGATIVE 05/03/2017 1445   HGBUR TRACE (A) 05/03/2017 1445   BILIRUBINUR NEGATIVE 05/03/2017 1445   KETONESUR NEGATIVE 05/03/2017 1445   PROTEINUR 30 (A) 05/03/2017 1445   UROBILINOGEN 0.2 11/24/2013 1417   NITRITE NEGATIVE 05/03/2017 1445   LEUKOCYTESUR NEGATIVE 05/03/2017 1445    Radiological Exams on Admission: Dg Chest 2 View  Result Date: 05/03/2017 CLINICAL DATA:  Fever and cough over the last 4 days. EXAM: CHEST  2 VIEW COMPARISON:  01/15/2017 FINDINGS: heart size is normal. Mediastinal shadows are normal. There is newly seen patchy infiltrate and volume loss in both lung bases right more than left consistent with pneumonia. No dense consolidation or lobar collapse. No effusions. IMPRESSION: Newly seen infiltrate and atelectasis at the lung bases right more than left consistent with pneumonia. Electronically Signed   By: Nelson Chimes M.D.   On: 05/03/2017 11:09   Ct Angio Chest Pe W And/or Wo Contrast  Result Date: 05/03/2017 CLINICAL DATA:  Left shoulder pain. Unable to move the left arm. Cough, congestion, fever for 4 days EXAM: CT ANGIOGRAPHY CHEST WITH CONTRAST TECHNIQUE: Multidetector CT imaging of the chest was performed using the standard protocol during bolus administration of intravenous contrast. Multiplanar CT image reconstructions and MIPs were obtained to evaluate the vascular anatomy. CONTRAST:  100 mL Isovue 370 COMPARISON:  None. FINDINGS: Cardiovascular: Satisfactory opacification of the pulmonary arteries to the segmental level. No evidence of pulmonary embolism. Mild cardiomegaly. No pericardial effusion. Normal caliber thoracic aorta. No thoracic aortic  dissection. Mediastinum/Nodes: Mild bilateral axillary lymphadenopathy. Largest right axillary lymph node measures 10 mm in short axis. Largest left axillary lymph node measures 11 mm in short axis. Mildly enlarged right lower paratracheal lymph node measuring 12 mm in short axis. Thyroid gland, trachea, and esophagus demonstrate no significant findings. Lungs/Pleura: Bibasilar atelectasis. No focal consolidation,  pleural effusion or pneumothorax. Upper Abdomen: Splenomegaly.  No acute upper abdominal abnormality. Musculoskeletal: No acute osseous abnormality. No lytic or sclerotic osseous lesion. Review of the MIP images confirms the above findings. IMPRESSION: 1. No evidence pulmonary embolus. 2. No thoracic aortic dissection. 3. Mild axillary lymphadenopathy. Splenomegaly. These findings are in keeping with the patient's history of CLL. Electronically Signed   By: Kathreen Devoid   On: 05/03/2017 15:02   Ct Shoulder Left Wo Contrast  Result Date: 05/03/2017 CLINICAL DATA:  Left shoulder pain. EXAM: CT OF THE UPPER LEFT EXTREMITY WITHOUT CONTRAST TECHNIQUE: Multidetector CT imaging of the upper left extremity was performed according to the standard protocol. COMPARISON:  Left shoulder x-rays dated April 30, 2017. FINDINGS: Bones/Joint/Cartilage No acute osseous abnormality. The acromioclavicular and glenohumeral joint spaces are preserved. No glenohumeral joint effusion. Ligaments Suboptimally assessed by CT. Muscles and Tendons Normal muscle bulk.  The rotator cuff is grossly intact. Soft tissues Multiple prominent and enlarged left axillary and subpectoral lymph nodes measuring up to 1.3 cm in short axis. Multiple prominent lower cervical lymph nodes, incompletely evaluated. Soft tissues are otherwise unremarkable. The visualized left lung is clear. IMPRESSION: 1. No acute osseous abnormality or significant degenerative changes. 2. Left axillary and subpectoral lymphadenopathy, likely related to patient's  history of chronic lymphocytic leukemia. Electronically Signed   By: Titus Dubin M.D.   On: 05/03/2017 14:56    EKG: Independently reviewed. NA  Assessment/Plan Active Problems:   Sepsis ([place])  51 year old male with significant past medical history for CLL who presents with left shoulder pain, fevers, chills, and generalized malaise. Worsening symptoms for last 4 days. On initial physical examination temperature was 103.1, blood pressure 129/89, heart rate 139, respiratory 24, oxygen saturation 94% on room air. Dry mucous membranes, positive pallor, lungs clear to auscultation bilaterally, no wheeze or rales rhonchi, heart is also present tachycardic with a culture murmurs, nontender, no edema. Significant tenderness on left shoulder, positive nonpitting edema and hyperpigmentation of the axilla, no blisters or erythematous rash. Sodium 130, potassium 3.9, chloride 98, bicarbonate 23, glucose 154, BUN 15, creatinine 0.94, calcium 9.0, white count 11.7, Hemoglobin13.1, hematocrit 30.1, platelets 127, venous lactic acid 1.18, d-dimer 1.87, INR 1.16, influenza A and B negative. Urinalysis negative for infection, chest x-ray, personally review showing right diaphragm elevation with possible atelectasis, CT chest lung windows with no infiltrates, but bibasilar atelectasis, negative for pulmonary embolism. Left shoulder CT with no acute osseous abnormality of significant degenerative changes, left axillary and subpectoral lymphadenopathy.   Patient will be admitted to the stepdown unit with the working diagnosis of sepsis, likely viral syndrome rule out bacterial pneumonia.   1. Sepsis, present on admission. Patient has clearly sepsis syndrome including fever, tachycardia and tachypnea. His chest x-ray is suggestive of right basilar infiltrate which was ruled out by CT chest. A viral syndrome cannot be completely ruled out, he does have pain in a dermatomal pattern, the level of his left shoulder, rising  the possibility of herpes zoster. For now will continue antibiotic therapy with ceftriaxone and azithromycin, for suspected community-acquired pneumonia, with a low threshold to discontinue antibiotics, if no further signs of pneumonia. Close observation on skin changes on his left shoulder, patient may need acyclovir therapy, if blisters or erythema occur. Close follow-up on cell count, temperature curve and cultures. Continue IV fluids with 100 mL per hour normal saline  2. Hypertension. For now will hold on lisinopril due to risk of hypotension. Will continue IV fluids for hydration,  with crystalloid isotonic solutions, normal saline 100 mL per hour.   3. Dyspepsia. Continue proton pump inhibitors.   4. CLL. Chronic and stable, will continue close follow-up cell count. Rai stage I/II chronic lymphocytic leukemia with low to intermediate risk disease. Diagnosis in April 2013, along with mild ITP. Currently on active surveillance, follow-up at [facility]. Old records were personally reviewed.  5. Left shoulder pain. Will continue pain control with ibuprofen, IV ketorolac and IV morphine. No signs of septic arthritis.   DVT prophylaxis: enoxaparin Code Status: full  Family Communication: I spoke with patient's wife at the bedside and all questions were addressed  Disposition Plan: Hom  Consults called: None  Admission status: Inpatient.     Maiya Kates Gerome Apley MD [facility] Pager [phone]  If 7PM-7AM, please contact night-coverage [email or web address] Password [email or web address]  05/03/2017, 9:02 PM

## 2017-05-03 NOTE — Progress Notes (Signed)
I was called regarding admission for sepsis due to pneumonia. I agree with assessment and advised starting vanc/zosyn/azithro, getting bcx, oxygen therapy and admit to stepdown unit. I advised CT scan of left shoulder due to severe pain to r/o sepsis. Pt needs ortho eval once admitted.

## 2017-05-03 NOTE — ED Notes (Signed)
Patient transported to CT 

## 2017-05-03 NOTE — ED Notes (Signed)
Zosyn held at this time d/t need for IV site for IV contrast in CT.

## 2017-05-03 NOTE — Progress Notes (Signed)
Pt. Given neb tx as ordered.  CXR clear, BBS clear, dry non-productive cough with no hx of lung disease.  Pt. states no  dyspnea

## 2017-05-03 NOTE — ED Notes (Signed)
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

## 2017-05-03 NOTE — ED Provider Notes (Signed)
Medical screening examination/treatment/procedure(s) were conducted as a shared visit with non-physician practitioner(s) and myself.  I personally evaluated the patient during the encounter.   EKG Interpretation None     Patient has history of CLL. He developed severe pain in his left shoulder on the lower 5 days ago. There was no associated injury. He is also develops now fever to max of 104 with associated productive cough, nausea and chest pain. Shoulder pain was evaluated by x-ray which was -2 days ago. Pain has however increased to the point that there is no comfortable position and any movement is exquisitely painful. On examination patient is alert and nontoxic. No respiratory distress. Heart is tachycardic. Lungs are grossly clear without appreciable wheeze or rhonchi. No visible swelling to the shoulder. Patient however cannot move it due to severity of pain. No edema of the extremity, distal pulse is normal and patient is neuro vascularly intact. Abdomen is soft without guarding. Lower extremities do not have peripheral edema or calf tenderness. Sepsis protocol initiated. Chest x-ray shows pneumonia. CT and you also obtained to rule out PE which is negative. I agree with plan of management. CRITICAL CARE Performed by: Charlesetta Shanks   Total critical care time: 30  minutes  Critical care time was exclusive of separately billable procedures and treating other patients.  Critical care was necessary to treat or prevent imminent or life-threatening deterioration.  Critical care was time spent personally by me on the following activities: development of treatment plan with patient and/or surrogate as well as nursing, discussions with consultants, evaluation of patient's response to treatment, examination of patient, obtaining history from patient or surrogate, ordering and performing treatments and interventions, ordering and review of laboratory studies, ordering and review of radiographic  studies, pulse oximetry and re-evaluation of patient's condition.   Charlesetta Shanks, MD 05/05/17 [phone]

## 2017-05-03 NOTE — ED Triage Notes (Signed)
Fever x 4 days. Also reports severe L shoulder pain. Seen at doctor upstairs ref shoulder. No meds taken today.

## 2017-05-03 NOTE — ED Provider Notes (Signed)
[facility] DEPT [facility] Provider Note   CSN: [id number] Arrival date & time: 05/03/17  1015     History   Chief Complaint Chief Complaint  Patient presents with  . Fever    HPI Todd Novak is a 51 y.o. male with a history of CLL who presents to the emergency department with a chief complaint of fever that began 5 days ago. His wife reports a Tmax of 104 at home yesterday.   His wife also states that the patient had a syncopal episode yesterday that lasted for about a minute. No shaking or jerking. The wife states the patient's eyes were open, but he was not responding to her voice. She reports that after he started responding, he seemed very tired and "out of it" for the next few hours.   He reports he has been controlling his fever with Tylenol and ibuprofen at home. He reports associated dyspnea, nausea, chest pain, productive cough, and left shoulder pain. No abdominal pain, emesis, or diarrhea.  He was evaluated by Dr. Nani Ravens 2 days ago for his left shoulder pain who performed an x-ray which was negative for fracture or bony lesions. He has been treating his symptoms with muscle relaxers without relief. He reports decreased range of motion since the onset of the pain 5 days ago. He reports that since the left shoulder pain began, that he has felt worsening pain diffusely in all of his joints, but states that it may be from laying around 6 over the last few days since he hasn't felt well.  He is not up-to-date on his flu shot.  The history is provided by the patient. No language interpreter was used.    Past Medical History:  Diagnosis Date  . Asthma    childhood, resolved  . CLL (chronic lymphocytic leukemia) ([place])   . Environmental allergies   . Fatigue   . GERD (gastroesophageal reflux disease)   . Heart murmur   . HTN (hypertension)     Patient Active Problem List   Diagnosis Date Noted  . Sepsis ([place]) 05/03/2017  . History of Barrett's esophagus 01/20/2017    . Sebaceous cyst 03/25/2016  . Palpitations 05/09/2015  . Aortic stenosis 05/09/2015  . CLL (chronic lymphocytic leukemia) ([place]) 04/21/2015  . Undiagnosed cardiac murmurs 04/21/2015  . Abnormal finding on EKG 04/21/2015  . Degeneration of intervertebral disc of lumbar region 08/22/2014  . Bilateral lumbar radiculopathy 06/21/2014  . Acute bacterial sinusitis 05/12/2014  . Abdominal pain, chronic, epigastric 11/28/2013  . Fatigue 11/28/2013  . Hypertension 11/28/2013  . GERD (gastroesophageal reflux disease) 11/28/2013    Past Surgical History:  Procedure Laterality Date  . APPENDECTOMY    . CHOLECYSTECTOMY    . WISDOM TOOTH EXTRACTION         Home Medications    Prior to Admission medications   Medication Sig Start Date End Date Taking? Authorizing Provider  acetaminophen (TYLENOL) 325 MG tablet Take 650 mg by mouth as needed.    [provider]  albuterol (PROVENTIL HFA;VENTOLIN HFA) 108 (90 Base) MCG/ACT inhaler Inhale 2 puffs into the lungs every 6 (six) hours as needed for wheezing or shortness of breath. 05/30/16   Debbrah Alar, NP  Ascorbic Acid (VITAMIN C) 100 MG tablet Take 100 mg by mouth daily.    [provider]  aspirin 81 MG tablet Take 81 mg by mouth daily as needed.     [provider]  cyclobenzaprine (FLEXERIL) 10 MG tablet Take 1 tablet (10  mg total) by mouth 3 (three) times daily as needed for muscle spasms. 04/30/17   Shelda Pal, DO  dicyclomine (BENTYL) 10 MG capsule Take 1 tablet by mouth twice daily for abdominal pain, spasms. 01/20/17   Zehr, Laban Emperor, PA-C  fexofenadine (ALLEGRA) 180 MG tablet Take 1 tablet (180 mg total) by mouth daily. 05/30/16   Debbrah Alar, NP  lisinopril (PRINIVIL,ZESTRIL) 20 MG tablet TAKE 1 TABLET(20 MG) BY MOUTH DAILY 01/13/17   Debbrah Alar, NP  LYSINE PO Take 1 tablet by mouth daily.    [provider]  Multiple Vitamin (MULTIVITAMIN) tablet Take 1 tablet by  mouth daily.    [provider]  naproxen (NAPROSYN) 500 MG tablet TAKE 1 TABLET(500 MG) BY MOUTH TWICE DAILY WITH A MEAL 05/01/17   Wendling, Crosby Oyster, DO  omeprazole (PRILOSEC) 40 MG capsule Take 1 tablet by mouth twice daily. 01/20/17   Zehr, Laban Emperor, PA-C  valACYclovir (VALTREX) 1000 MG tablet 2 tabs at start of cold sore followed by 2 tabs 12 hours later. 04/30/17   Shelda Pal, DO  vitamin E 400 UNIT capsule Take 400 Units by mouth daily.    [provider]    Family History Family History  Problem Relation Age of Onset  . Hypertension Mother        Living  . Hypertension Father 6       Deceased  . Hypertension Maternal Grandmother   . Hypertension Maternal Grandfather   . Healthy Sister   . Healthy Son        x1  . Healthy Daughter        x2  . Cancer Neg Hx   . Diabetes Neg Hx   . Heart disease Neg Hx     Social History Social History  Substance Use Topics  . Smoking status: Never Smoker  . Smokeless tobacco: Never Used  . Alcohol use 1.8 oz/week    3 Glasses of wine per week     Comment: Occ     Allergies   Methocarbamol and Tramadol   Review of Systems Review of Systems  Constitutional: Positive for chills and fever.  Respiratory: Positive for cough and shortness of breath.   Cardiovascular: Positive for chest pain.  Gastrointestinal: Negative for abdominal pain, diarrhea, nausea and vomiting.  Genitourinary: Positive for dysuria. Negative for frequency and urgency.  Musculoskeletal: Positive for arthralgias and myalgias. Negative for back pain, neck pain and neck stiffness.  Skin: Negative for rash.  Allergic/Immunologic: Positive for immunocompromised state.     Physical Exam Updated Vital Signs BP 111/85   Pulse (!) 101   Temp (!) 100.9 F (38.3 C) (Oral)   Resp (!) 24   Ht 5' 8.5" (1.74 m)   Wt 88.5 kg (195 lb)   SpO2 98%   BMI 29.22 kg/m   Physical Exam  Constitutional: He appears well-developed. No  distress.  HENT:  Head: Normocephalic.  Eyes: Conjunctivae are normal.  Neck: Normal range of motion. Neck supple.  Cardiovascular: Regular rhythm and normal heart sounds.  Tachycardia present.  Exam reveals no gallop and no friction rub.   No murmur heard. Pulmonary/Chest: Effort normal. No respiratory distress. He has no wheezes. He has rales.  Abdominal: Soft. Bowel sounds are normal. He exhibits no distension and no mass. There is no tenderness. There is no rebound and no guarding.  Musculoskeletal: He exhibits tenderness. He exhibits no edema or deformity.  Significantly tender to palpation over the posterior  left acromion. Decreased active range of motion of the left shoulder secondary to pain. Neurovascularly intact. No overlying erythema, warmth, or edema.  Neurological: He is alert.  Skin: Skin is warm. He is diaphoretic.  Psychiatric: His behavior is normal.  Nursing note and vitals reviewed.    ED Treatments / Results  Labs (all labs ordered are listed, but only abnormal results are displayed) Labs Reviewed  COMPREHENSIVE METABOLIC PANEL - Abnormal; Notable for the following:       Result Value   Sodium 130 (*)    Chloride 98 (*)    Glucose, Bld 154 (*)    AST 43 (*)    All other components within normal limits  CBC WITH DIFFERENTIAL/PLATELET - Abnormal; Notable for the following:    WBC 11.7 (*)    RBC 4.18 (*)    HCT 38.1 (*)    Platelets 127 (*)    Lymphs Abs 7.8 (*)    All other components within normal limits  URINALYSIS, ROUTINE W REFLEX MICROSCOPIC - Abnormal; Notable for the following:    Hgb urine dipstick TRACE (*)    Protein, ur 30 (*)    All other components within normal limits  D-DIMER, QUANTITATIVE (NOT AT [facility]) - Abnormal; Notable for the following:    D-Dimer, Quant 1.87 (*)    All other components within normal limits  URINALYSIS, MICROSCOPIC (REFLEX) - Abnormal; Notable for the following:    Bacteria, UA MANY (*)    Squamous Epithelial / LPF  0-5 (*)    All other components within normal limits  CULTURE, BLOOD (ROUTINE X 2)  CULTURE, BLOOD (ROUTINE X 2)  URINE CULTURE  PROTIME-INR  TROPONIN I  INFLUENZA PANEL BY PCR (TYPE A & B)  PATHOLOGIST SMEAR REVIEW  I-STAT CG4 LACTIC ACID, ED    EKG  EKG Interpretation None       Radiology Dg Chest 2 View  Result Date: 05/03/2017 CLINICAL DATA:  Fever and cough over the last 4 days. EXAM: CHEST  2 VIEW COMPARISON:  01/15/2017 FINDINGS: heart size is normal. Mediastinal shadows are normal. There is newly seen patchy infiltrate and volume loss in both lung bases right more than left consistent with pneumonia. No dense consolidation or lobar collapse. No effusions. IMPRESSION: Newly seen infiltrate and atelectasis at the lung bases right more than left consistent with pneumonia. Electronically Signed   By: Nelson Chimes M.D.   On: 05/03/2017 11:09   Ct Angio Chest Pe W And/or Wo Contrast  Result Date: 05/03/2017 CLINICAL DATA:  Left shoulder pain. Unable to move the left arm. Cough, congestion, fever for 4 days EXAM: CT ANGIOGRAPHY CHEST WITH CONTRAST TECHNIQUE: Multidetector CT imaging of the chest was performed using the standard protocol during bolus administration of intravenous contrast. Multiplanar CT image reconstructions and MIPs were obtained to evaluate the vascular anatomy. CONTRAST:  100 mL Isovue 370 COMPARISON:  None. FINDINGS: Cardiovascular: Satisfactory opacification of the pulmonary arteries to the segmental level. No evidence of pulmonary embolism. Mild cardiomegaly. No pericardial effusion. Normal caliber thoracic aorta. No thoracic aortic dissection. Mediastinum/Nodes: Mild bilateral axillary lymphadenopathy. Largest right axillary lymph node measures 10 mm in short axis. Largest left axillary lymph node measures 11 mm in short axis. Mildly enlarged right lower paratracheal lymph node measuring 12 mm in short axis. Thyroid gland, trachea, and esophagus demonstrate no  significant findings. Lungs/Pleura: Bibasilar atelectasis. No focal consolidation, pleural effusion or pneumothorax. Upper Abdomen: Splenomegaly.  No acute upper abdominal abnormality. Musculoskeletal: No acute  osseous abnormality. No lytic or sclerotic osseous lesion. Review of the MIP images confirms the above findings. IMPRESSION: 1. No evidence pulmonary embolus. 2. No thoracic aortic dissection. 3. Mild axillary lymphadenopathy. Splenomegaly. These findings are in keeping with the patient's history of CLL. Electronically Signed   By: Kathreen Devoid   On: 05/03/2017 15:02   Ct Shoulder Left Wo Contrast  Result Date: 05/03/2017 CLINICAL DATA:  Left shoulder pain. EXAM: CT OF THE UPPER LEFT EXTREMITY WITHOUT CONTRAST TECHNIQUE: Multidetector CT imaging of the upper left extremity was performed according to the standard protocol. COMPARISON:  Left shoulder x-rays dated April 30, 2017. FINDINGS: Bones/Joint/Cartilage No acute osseous abnormality. The acromioclavicular and glenohumeral joint spaces are preserved. No glenohumeral joint effusion. Ligaments Suboptimally assessed by CT. Muscles and Tendons Normal muscle bulk.  The rotator cuff is grossly intact. Soft tissues Multiple prominent and enlarged left axillary and subpectoral lymph nodes measuring up to 1.3 cm in short axis. Multiple prominent lower cervical lymph nodes, incompletely evaluated. Soft tissues are otherwise unremarkable. The visualized left lung is clear. IMPRESSION: 1. No acute osseous abnormality or significant degenerative changes. 2. Left axillary and subpectoral lymphadenopathy, likely related to patient's history of chronic lymphocytic leukemia. Electronically Signed   By: Titus Dubin M.D.   On: 05/03/2017 14:56    Procedures Procedures (including critical care time)  Medications Ordered in ED Medications  ipratropium-albuterol (DUONEB) 0.5-2.5 (3) MG/3ML nebulizer solution 3 mL (3 mLs Nebulization Given by Other 05/03/17 1225)   acetaminophen (TYLENOL) tablet 650 mg (650 mg Oral Given 05/03/17 1643)  vancomycin (VANCOCIN) IVPB 1000 mg/200 mL premix (0 mg Intravenous Stopped 05/03/17 1454)    And  vancomycin (VANCOCIN) 500 mg in sodium chloride 0.9 % 100 mL IVPB (500 mg Intravenous New Bag/Given 05/03/17 1811)  vancomycin (VANCOCIN) IVPB 1000 mg/200 mL premix (not administered)  piperacillin-tazobactam (ZOSYN) IVPB 3.375 g (3.375 g Intravenous New Bag/Given 05/03/17 1432)  vancomycin (VANCOCIN) 500 MG powder (not administered)  acetaminophen (TYLENOL) tablet 650 mg (650 mg Oral Given 05/03/17 1043)  morphine 4 MG/ML injection 4 mg (4 mg Intravenous Given 05/03/17 1226)  ondansetron (ZOFRAN) injection 4 mg (4 mg Intravenous Given 05/03/17 1219)  sodium chloride 0.9 % bolus 1,000 mL (0 mLs Intravenous Stopped 05/03/17 1340)  diphenhydrAMINE (BENADRYL) injection 12.5 mg (12.5 mg Intravenous Given 05/03/17 1223)  sodium chloride 0.9 % bolus 1,000 mL (0 mLs Intravenous Stopped 05/03/17 1513)  cefTRIAXone (ROCEPHIN) 1 g in dextrose 5 % 50 mL IVPB (0 g Intravenous Stopped 05/03/17 1319)  azithromycin (ZITHROMAX) 500 mg in dextrose 5 % 250 mL IVPB (0 mg Intravenous Stopped 05/03/17 1347)  HYDROmorphone (DILAUDID) injection 1 mg (1 mg Intravenous Given 05/03/17 1255)  ketorolac (TORADOL) 30 MG/ML injection 30 mg (30 mg Intravenous Given 05/03/17 1258)  iopamidol (ISOVUE-370) 76 % injection 100 mL (100 mLs Intravenous Contrast Given 05/03/17 1405)  HYDROmorphone (DILAUDID) injection 1 mg (1 mg Intravenous Given 05/03/17 1508)     Initial Impression / Assessment and Plan / ED Course  I have reviewed the triage vital signs and the nursing notes.  Pertinent labs & imaging results that were available during my care of the patient were reviewed by me and considered in my medical decision making (see chart for details).     51 year old male with a history of CLL presenting with fever, chills, chest pain, shortness of breath, diffuse joint  pain, and atraumatic left shoulder pain. Febrile to 103.1 on arrival. WBC 11.7. Chest x-ray with bilateral lower lobe  pneumonia. Troponin negative. D-dimer elevated to 18.7; CT angio negative for PE. Consulted Dr. Dreama Saa with the hospitalist team who will admit the patient to stepdown for continued evaluation of sepsis secondary to pneumonia. Influenza panel negative. The patient was initially given 1 dose of azithromycin and ceftriaxone; however the medications were changed to vancomycin and Zosyn given the patient's immunocompromised status. CT of the left shoulder is unremarkable for a cause of the patient's left shoulder pain. The patient's fever has continued to improve with Tylenol administration. Currently 100.9. Tachycardia has improved with fluids. Pain controlled with Dilaudid. The patient appears reasonably stabilized for admission considering the current resources, flow, and capabilities available in the ED at this time, and I doubt any other [facility] requiring further screening and/or treatment in the ED prior to admission.   Final Clinical Impressions(s) / ED Diagnoses   Final diagnoses:  Sepsis, due to unspecified organism [facility])  Community acquired pneumonia, unspecified laterality    New Prescriptions New Prescriptions   No medications on file     Joanne Gavel, PA-C 05/03/17 1825    Charlesetta Shanks, MD 05/05/17 1517

## 2017-05-03 NOTE — ED Provider Notes (Signed)
Patient being transported to [facility].   Pt awake and alert.  Vitals:   05/03/17 1830 05/03/17 1900  BP: 109/75 96/77  Pulse: 95 (!) 104  Resp: (!) 24 (!) 22  Temp:    SpO2: 95% 97%   Pt currently appears stable for transfer.    Lajean Saver, MD 05/03/17 [phone]

## 2017-05-03 NOTE — Progress Notes (Signed)
Pharmacy Antibiotic Note  Todd Novak is a 51 y.o. male admitted on 05/03/2017 with fever x4 days.  Pharmacy has been consulted for vancomycin and Zosyn dosing for PNA.  Patient has a history of CLL.  Renal function is stable.  Tmax 103.1, WBC 11.7, LA 1.18.   Plan: Vanc 1500mg  IV x 1, then 1gm IV Q8H Zosyn EI 3.375gm IV Q8H Monitor renal fxn, clinical progress, vanc trough as indicated   Height: 5' 8.5" (174 cm) Weight: 195 lb (88.5 kg) IBW/kg (Calculated) : 69.55  Temp (24hrs), Avg:102.8 F (39.3 C), Min:102.5 F (39.2 C), Max:103.1 F (39.5 C)   Recent Labs Lab 05/03/17 1030 05/03/17 1042  WBC 11.7*  --   CREATININE 0.94  --   LATICACIDVEN  --  1.18    Estimated Creatinine Clearance: 100.4 mL/min (by C-G formula based on SCr of 0.94 mg/dL).    Allergies  Allergen Reactions  . Methocarbamol Nausea Only    dizziness  . Tramadol Nausea And Vomiting    Vanc 10/7 >> Zosyn 10/7 >> Azith 10/7  10/7 BCx -    Traivon Morrical D. Mina Marble, PharmD, BCPS Pager:  [phone] 05/03/2017, 1:42 PM

## 2017-05-04 DIAGNOSIS — K219 Gastro-esophageal reflux disease without esophagitis: Secondary | ICD-10-CM

## 2017-05-04 DIAGNOSIS — R7881 Bacteremia: Secondary | ICD-10-CM

## 2017-05-04 DIAGNOSIS — C9110 Chronic lymphocytic leukemia of B-cell type not having achieved remission: Secondary | ICD-10-CM

## 2017-05-04 DIAGNOSIS — Z888 Allergy status to other drugs, medicaments and biological substances status: Secondary | ICD-10-CM

## 2017-05-04 DIAGNOSIS — B951 Streptococcus, group B, as the cause of diseases classified elsewhere: Secondary | ICD-10-CM

## 2017-05-04 DIAGNOSIS — M25512 Pain in left shoulder: Secondary | ICD-10-CM | POA: Diagnosis present

## 2017-05-04 DIAGNOSIS — R740 Nonspecific elevation of levels of transaminase and lactic acid dehydrogenase [LDH]: Secondary | ICD-10-CM

## 2017-05-04 DIAGNOSIS — R5081 Fever presenting with conditions classified elsewhere: Secondary | ICD-10-CM

## 2017-05-04 DIAGNOSIS — R011 Cardiac murmur, unspecified: Secondary | ICD-10-CM

## 2017-05-04 DIAGNOSIS — Z8249 Family history of ischemic heart disease and other diseases of the circulatory system: Secondary | ICD-10-CM

## 2017-05-04 DIAGNOSIS — A401 Sepsis due to streptococcus, group B: Principal | ICD-10-CM

## 2017-05-04 DIAGNOSIS — R0902 Hypoxemia: Secondary | ICD-10-CM

## 2017-05-04 LAB — COMPREHENSIVE METABOLIC PANEL
ALT: 92 U/L — ABNORMAL HIGH (ref 17–63)
AST: 75 U/L — ABNORMAL HIGH (ref 15–41)
Albumin: 2.9 g/dL — ABNORMAL LOW (ref 3.5–5.0)
Alkaline Phosphatase: 103 U/L (ref 38–126)
Anion gap: 9 (ref 5–15)
BUN: 13 mg/dL (ref 6–20)
CO2: 23 mmol/L (ref 22–32)
Calcium: 8.3 mg/dL — ABNORMAL LOW (ref 8.9–10.3)
Chloride: 103 mmol/L (ref 101–111)
Creatinine, Ser: 0.94 mg/dL (ref 0.61–1.24)
GFR calc Af Amer: >60 mL/min (ref >60)
GFR calc non Af Amer: >60 mL/min (ref >60)
Glucose, Bld: 131 mg/dL — ABNORMAL HIGH (ref 65–99)
Potassium: 4 mmol/L (ref 3.5–5.1)
Sodium: 135 mmol/L (ref 135–145)
Total Bilirubin: 0.5 mg/dL (ref 0.3–1.2)
Total Protein: 5.9 g/dL — ABNORMAL LOW (ref 6.5–8.1)

## 2017-05-04 LAB — CBC
HCT: 33.9 % — ABNORMAL LOW (ref 39.0–52.0)
Hemoglobin: 11.4 g/dL — ABNORMAL LOW (ref 13.0–17.0)
MCH: 31.1 pg (ref 26.0–34.0)
MCHC: 33.6 g/dL (ref 30.0–36.0)
MCV: 92.6 fL (ref 78.0–100.0)
Platelets: 110 10*3/uL — ABNORMAL LOW (ref 150–400)
RBC: 3.66 MIL/uL — ABNORMAL LOW (ref 4.22–5.81)
RDW: 14 % (ref 11.5–15.5)
WBC: 18.8 10*3/uL — ABNORMAL HIGH (ref 4.0–10.5)

## 2017-05-04 LAB — BLOOD CULTURE ID PANEL (REFLEXED)
Acinetobacter baumannii: NOT DETECTED
Candida albicans: NOT DETECTED
Candida glabrata: NOT DETECTED
Candida krusei: NOT DETECTED
Candida parapsilosis: NOT DETECTED
Candida tropicalis: NOT DETECTED
Carbapenem resistance: NOT DETECTED
Enterobacter cloacae complex: NOT DETECTED
Enterobacteriaceae species: NOT DETECTED
Enterococcus species: NOT DETECTED
Escherichia coli: NOT DETECTED
Haemophilus influenzae: NOT DETECTED
Klebsiella oxytoca: NOT DETECTED
Klebsiella pneumoniae: NOT DETECTED
Listeria monocytogenes: NOT DETECTED
Methicillin resistance: NOT DETECTED
Neisseria meningitidis: NOT DETECTED
Proteus species: NOT DETECTED
Pseudomonas aeruginosa: NOT DETECTED
Serratia marcescens: NOT DETECTED
Staphylococcus aureus (BCID): NOT DETECTED
Staphylococcus species: NOT DETECTED
Streptococcus agalactiae: DETECTED — AB
Streptococcus pneumoniae: NOT DETECTED
Streptococcus pyogenes: NOT DETECTED
Streptococcus species: DETECTED — AB
Vancomycin resistance: NOT DETECTED

## 2017-05-04 LAB — HIV ANTIBODY (ROUTINE TESTING W REFLEX): HIV Screen 4th Generation wRfx: NONREACTIVE

## 2017-05-04 LAB — PATHOLOGIST SMEAR REVIEW

## 2017-05-04 MED ORDER — TRAMADOL HCL 50 MG PO TABS: 50.0000 mg | ORAL_TABLET | Freq: Four times a day (QID) | ORAL | Status: DC | PRN

## 2017-05-04 MED ORDER — GUAIFENESIN 100 MG/5ML PO SOLN
5.0000 mL | ORAL | Status: DC | PRN
  Administered 2017-05-04: 100 mg via ORAL
  Filled 2017-05-04: qty 10

## 2017-05-04 MED ORDER — ACETAMINOPHEN 650 MG RE SUPP: 650.0000 mg | Freq: Four times a day (QID) | RECTAL | Status: DC | PRN

## 2017-05-04 MED ORDER — DEXTROSE 5 % IV SOLN
2.0000 g | Freq: Every day | INTRAVENOUS | Status: DC
  Administered 2017-05-04 – 2017-05-08 (×5): 2 g via INTRAVENOUS
  Filled 2017-05-04 (×5): qty 2

## 2017-05-04 MED ORDER — ACETAMINOPHEN 500 MG PO TABS: 1000.0000 mg | ORAL_TABLET | Freq: Four times a day (QID) | ORAL | Status: DC | PRN

## 2017-05-04 NOTE — Progress Notes (Signed)
Pharmacy Antibiotic Note  Todd Novak is a 51 y.o. male admitted on 05/03/2017 with pneumonia.  Pharmacy has been consulted for rocephin dosing. BCID 2 of 4 bottles + for Strep agalactiae  Plan: Rocephin 2 Gm IV q24h for CAP/bacteremia  Height: 5' 8.5" (174 cm) Weight: 200 lb 9.9 oz (91 kg) IBW/kg (Calculated) : 69.55  Temp (24hrs), Avg:101.2 F (38.4 C), Min:98.5 F (36.9 C), Max:103.1 F (39.5 C)   Recent Labs Lab 05/03/17 1030 05/03/17 1042 05/04/17 0334  WBC 11.7*  --   --   CREATININE 0.94  --  0.94  LATICACIDVEN  --  1.18  --     Estimated Creatinine Clearance: 101.7 mL/min (by C-G formula based on SCr of 0.94 mg/dL).    Allergies  Allergen Reactions  . Methocarbamol Nausea Only    dizziness  . Tramadol Nausea And Vomiting    Antimicrobials this admission: 10/7 zosyn/vancomycin >> 10/8 10/7 rocephin >>  10/7 zmax >>  Dose adjustments this admission:   Microbiology results:  BCx:   UCx:    Sputum:    MRSA PCR:   Thank you for allowing pharmacy to be a part of this patient's care.  Dorrene German 05/04/2017 4:58 AM

## 2017-05-04 NOTE — Consult Note (Signed)
[facility] for Infectious Disease       Reason for Consult: bacteremia    Referring Physician: Dr. Bonner Puna  Principal Problem:   Sepsis due to Streptococcus agalactiae ([place]) Active Problems:   HTN (hypertension)   GERD (gastroesophageal reflux disease)   CLL (chronic lymphocytic leukemia) (HCC)   Acute pain of left shoulder   . dicyclomine  10 mg Oral TID AC  . enoxaparin (LOVENOX) injection  40 mg Subcutaneous QHS  . loratadine  10 mg Oral Daily  . mouth rinse  15 mL Mouth Rinse BID  . multivitamin with minerals  1 tablet Oral Daily  . naproxen  500 mg Oral BID WC  . pantoprazole  40 mg Oral Daily  . vitamin C  125 mg Oral Daily  . vitamin E  400 Units Oral Daily    Recommendations: TTE and TEE if unrevealing MRI shoulder Repeat blood cultures I have stopped azithromycin  Assessment: GBS bacteremia with a new shoulder pain and possible pneumonia.  With this bacteremia, concern for endocarditis, particularly with noted murmur. Hypoxia - he does seem to have mild hypoxia despite a normal CT chest.  Unclear etiology.  Could be some element of pneumonitis due to CLL treatment.   HIV negative transaminitis - transient, 3 months ago was wnl.    Antibiotics: Day 2 antibiotics Day 2 ceftriaxone Previous vancomycin, pip-tazo, azithromycin  HPI: Todd Novak is a 51 y.o. male with a history of CLL followed at [facility] who presented to [facility] with left shoulder pain, 10/10.  No history of shoulder surgery, no previous pain.  He did get a steroid injection from his PCP but no relief.  He can't move his arm up due to pain.  He had a fever to 103 and WBC 18.8.  Blood cultures grew 2/2 with GBS on BCID.  Feels better since admission.  CT shoulder without definitive infection.  He has had a persistent cough for over two weeks, dry. No associated n/v/d.  CT independently reviewed and minimal amount of possible effusion at the bases but no opacity/consolidation.   Review  of Systems:  Constitutional: negative for fevers, chills and fatigue Gastrointestinal: negative for diarrhea Integument/breast: negative for rash All other systems reviewed and are negative    Past Medical History:  Diagnosis Date  . Asthma    childhood, resolved  . CLL (chronic lymphocytic leukemia) ([place])   . Environmental allergies   . Fatigue   . GERD (gastroesophageal reflux disease)   . Heart murmur   . HTN (hypertension)     Social History  Substance Use Topics  . Smoking status: Never Smoker  . Smokeless tobacco: Never Used  . Alcohol use 1.8 oz/week    3 Glasses of wine per week     Comment: Occ    Family History  Problem Relation Age of Onset  . Hypertension Mother        Living  . Hypertension Father 37       Deceased  . Hypertension Maternal Grandmother   . Hypertension Maternal Grandfather   . Healthy Sister   . Healthy Son        x1  . Healthy Daughter        x2  . Cancer Neg Hx   . Diabetes Neg Hx   . Heart disease Neg Hx     Allergies  Allergen Reactions  . Methocarbamol Nausea Only    dizziness  . Tramadol Nausea And Vomiting  Physical Exam: Constitutional: in no apparent distress and alert  Vitals:   05/04/17 1400 05/04/17 1428  BP:  119/73  Pulse: 96 96  Resp: 18 17  Temp:    SpO2: 95% 94%   EYES: anicteric ENMT: no thrush Cardiovascular: Cor RRR and 2/6 Holosystolic murmur Respiratory: CTA B; normal respiratory effort GI: Bowel sounds are normal, liver is not enlarged, spleen is not enlarged Musculoskeletal: no pedal edema noted Skin: negatives: no rash Hematologic: no cervical lad  Lab Results  Component Value Date   WBC 18.8 (H) 05/04/2017   HGB 11.4 (L) 05/04/2017   HCT 33.9 (L) 05/04/2017   MCV 92.6 05/04/2017   PLT 110 (L) 05/04/2017    Lab Results  Component Value Date   CREATININE 0.94 05/04/2017   BUN 13 05/04/2017   NA 135 05/04/2017   K 4.0 05/04/2017   CL 103 05/04/2017   CO2 23 05/04/2017    Lab  Results  Component Value Date   ALT 92 (H) 05/04/2017   AST 75 (H) 05/04/2017   ALKPHOS 103 05/04/2017     Microbiology: Recent Results (from the past 240 hour(s))  Culture, blood (Routine x 2)     Status: None (Preliminary result)   Collection Time: 05/03/17 10:30 AM  Result Value Ref Range Status   Specimen Description BLOOD RIGHT ARM  Final   Special Requests   Final    BOTTLES DRAWN AEROBIC AND ANAEROBIC Blood Culture adequate volume   Culture  Setup Time   Final    IN BOTH AEROBIC AND ANAEROBIC BOTTLES GRAM POSITIVE COCCI IN CHAINS CRITICAL RESULT CALLED TO, READ BACK BY AND VERIFIED WITH: BShirlee Latch [id number] 05/04/2017 T. TYSOR    Culture GRAM POSITIVE COCCI  Final   Report Status PENDING  Incomplete  Blood Culture ID Panel (Reflexed)     Status: Abnormal   Collection Time: 05/03/17 10:30 AM  Result Value Ref Range Status   Enterococcus species NOT DETECTED NOT DETECTED Final   Vancomycin resistance NOT DETECTED NOT DETECTED Final   Listeria monocytogenes NOT DETECTED NOT DETECTED Final   Staphylococcus species NOT DETECTED NOT DETECTED Final   Staphylococcus aureus NOT DETECTED NOT DETECTED Final   Methicillin resistance NOT DETECTED NOT DETECTED Final   Streptococcus species DETECTED (A) NOT DETECTED Final    Comment: CRITICAL RESULT CALLED TO, READ BACK BY AND VERIFIED WITH: B. GREEN,PHARMD 0233 05/04/2017 T. TYSOR    Streptococcus agalactiae DETECTED (A) NOT DETECTED Final    Comment: CRITICAL RESULT CALLED TO, READ BACK BY AND VERIFIED WITH: B. GREEN,PHARMD 0233 05/04/2017 T. TYSOR    Streptococcus pneumoniae NOT DETECTED NOT DETECTED Final   Streptococcus pyogenes NOT DETECTED NOT DETECTED Final   Acinetobacter baumannii NOT DETECTED NOT DETECTED Final   Enterobacteriaceae species NOT DETECTED NOT DETECTED Final   Enterobacter cloacae complex NOT DETECTED NOT DETECTED Final   Escherichia coli NOT DETECTED NOT DETECTED Final   Klebsiella oxytoca NOT DETECTED NOT  DETECTED Final   Klebsiella pneumoniae NOT DETECTED NOT DETECTED Final   Proteus species NOT DETECTED NOT DETECTED Final   Serratia marcescens NOT DETECTED NOT DETECTED Final   Carbapenem resistance NOT DETECTED NOT DETECTED Final   Haemophilus influenzae NOT DETECTED NOT DETECTED Final   Neisseria meningitidis NOT DETECTED NOT DETECTED Final   Pseudomonas aeruginosa NOT DETECTED NOT DETECTED Final   Candida albicans NOT DETECTED NOT DETECTED Final   Candida glabrata NOT DETECTED NOT DETECTED Final   Candida krusei NOT DETECTED NOT DETECTED  Final   Candida parapsilosis NOT DETECTED NOT DETECTED Final   Candida tropicalis NOT DETECTED NOT DETECTED Final  Culture, blood (Routine x 2)     Status: None (Preliminary result)   Collection Time: 05/03/17 12:32 PM  Result Value Ref Range Status   Specimen Description BLOOD RIGHT FOREARM  Final   Special Requests   Final    BOTTLES DRAWN AEROBIC AND ANAEROBIC Blood Culture adequate volume   Culture  Setup Time   Final    GRAM POSITIVE COCCI IN CHAINS IN BOTH AEROBIC AND ANAEROBIC BOTTLES CRITICAL RESULT CALLED TO, READ BACK BY AND VERIFIED WITH: Jerilynn Mages RENZ,PHARMD AT 4827 05/04/17 BY L BENFIELD Performed at [facility], [place] [address], [place], [place] [address]    Culture GRAM POSITIVE COCCI  Final   Report Status PENDING  Incomplete  MRSA PCR Screening     Status: None   Collection Time: 05/03/17  8:37 PM  Result Value Ref Range Status   MRSA by PCR NEGATIVE NEGATIVE Final    Comment:        The GeneXpert MRSA Assay (FDA approved for NASAL specimens only), is one component of a comprehensive MRSA colonization surveillance program. It is not intended to diagnose MRSA infection nor to guide or monitor treatment for MRSA infections.     Scharlene Gloss, [facility] for Infectious Disease [place] [email or web address] [phone] pager  [phone] cell 05/04/2017, 2:56 PM

## 2017-05-04 NOTE — Progress Notes (Signed)
PROGRESS NOTE  Todd Novak  DTO:[id number] DOB: 04/26/1966 DOA: 05/03/2017 PCP: Debbrah Alar, NP  Brief Narrative: Todd Novak is a 51 y.o. left handed male with a history of CLL who presented with left shoulder pain, fever, chills and cough. He had been having severe chills and fever with persistent cough starting 2 weeks prior while on a business trip in [place] which seemed to improve some, though he's continued to have a nonproductive cough. He also noted gradual onset of severe left shoulder pain recently with no known preceding trauma, though he had lifted a larger freezer a few days prior. He presented to his PCP for this and had negative XR, received steroid injection, muscle relaxers without improvement in symptoms. He also has diffuse joint and body pains, and presented due to this and increasingly restricted ROM with recurrent fevers up to nearly 104F. On arrival he was febrile to 103.51F with WBC 11.7, CXR read as bibasilar pneumonia. Flu negative. D-dimer elevated with no evidence of PE, dissection, or infiltrate on CTA chest. Broad spectrum antibiotics were started and he was transferred to [facility] for ongoing care.  Assessment & Plan: Principal Problem:   Sepsis due to Streptococcus agalactiae (HCC) Active Problems:   HTN (hypertension)   GERD (gastroesophageal reflux disease)   CLL (chronic lymphocytic leukemia) (HCC)   Acute pain of left shoulder  Sepsis in immunocompromised host: Due to GBS bacteremia from ?pulmonary source.  - Agree with broad spectrum coverage, CTX up to 2g. Will ask ID for assistance.  - Received IVF's with improvement in hemodynamics.  - Continue to monitor urine culture (+bacteriuria) and blood cultures   Left shoulder pain: With negative CT. Exam with restricted and painful ROM ?adhesive capsulitis. Point tenderness worst on [facility] joint which is not separated on imaging ?bursitis, but did not respond to steroid injection by PCP. No skin findings to  suggest zoster. Mild lymphadenopathy bilaterally not likely etiology nor sequelae of unilateral symptoms.  - Will Tx with tylenol, tramadol, toradol. Pt declines opioids.   CLL: Currently on active surveillance at [facility] - Monitor  HTN:  - Continue IVFs, holding lisinopril.   Dyspepsia: Chronic, stable.  - Continue PPI  Thrombocytopenia: Mild, chronic due to ITP Dx 2013.  - Monitor platelet counts.   DVT prophylaxis: Lovenox Code Status: Full Family Communication: Wife at bedside Disposition Plan: Stable for transfer to floor  Consultants:   ID  Procedures:   None  Antimicrobials:  Vanc/zosyn x1 10/7  Ceftriaxone/azithromycin 10/7 >>   Subjective: Left shoulder pain and nonproductive cough are primary complaints, stable since admission.   Objective: Vitals:   05/04/17 0354 05/04/17 0400 05/04/17 0600 05/04/17 0800  BP:  114/71 121/82   Pulse:  93 91   Resp:  (!) 23 (!) 25   Temp: 98.5 F (36.9 C)   98.7 F (37.1 C)  TempSrc: Oral   Oral  SpO2:  94% 93%   Weight:      Height:        Intake/Output Summary (Last 24 hours) at 05/04/17 0907 Last data filed at 05/04/17 0600  Gross per 24 hour  Intake          3873.33 ml  Output              600 ml  Net          3273.33 ml   Filed Weights   05/03/17 1026 05/03/17 2000  Weight: 88.5 kg (195 lb) 91 kg (200 lb 9.9 oz)  Gen: 51 y.o. male in no distress Pulm: Non-labored breathing room air. Decreased at bases with crackles that clear with cough. Deep inspiration causing cough.   CV: Regular rate and rhythm. No murmur, rub, or gallop. No JVD, no LE edema. GI: Abdomen soft, non-tender, non-distended, with normoactive bowel sounds. No organomegaly or masses felt. Ext: Left shoulder with restricted AROM and PROM. No neck or elbow tenderness/restricted ROM. No bicipital groove tenderness. No palpable effusion, point tenderness to [facility] joint with hyperesthesia. Not warm.  Skin: No rashes, lesions no ulcers Neuro:  Alert and oriented. No focal neurological deficits. Psych: Judgement and insight appear normal. Mood & affect appropriate.   Data Reviewed: I have personally reviewed following labs and imaging studies  CBC:  Recent Labs Lab 05/03/17 1030 05/04/17 0334  WBC 11.7* 18.8*  NEUTROABS 3.2  --   HGB 13.1 11.4*  HCT 38.1* 33.9*  MCV 91.1 92.6  PLT 127* 106*   Basic Metabolic Panel:  Recent Labs Lab 05/03/17 1030 05/04/17 0334  NA 130* 135  K 3.9 4.0  CL 98* 103  CO2 23 23  GLUCOSE 154* 131*  BUN 15 13  CREATININE 0.94 0.94  CALCIUM 9.0 8.3*   GFR: Estimated Creatinine Clearance: 101.7 mL/min (by C-G formula based on SCr of 0.94 mg/dL). Liver Function Tests:  Recent Labs Lab 05/03/17 1030 05/04/17 0334  AST 43* 75*  ALT 54 92*  ALKPHOS 108 103  BILITOT 0.7 0.5  PROT 7.2 5.9*  ALBUMIN 3.5 2.9*   No results for input(s): LIPASE, AMYLASE in the last 168 hours. No results for input(s): AMMONIA in the last 168 hours. Coagulation Profile:  Recent Labs Lab 05/03/17 1030  INR 1.16   Cardiac Enzymes:  Recent Labs Lab 05/03/17 1030  TROPONINI <0.03   BNP (last 3 results) No results for input(s): PROBNP in the last 8760 hours. HbA1C: No results for input(s): HGBA1C in the last 72 hours. CBG: No results for input(s): GLUCAP in the last 168 hours. Lipid Profile: No results for input(s): CHOL, HDL, LDLCALC, TRIG, CHOLHDL, LDLDIRECT in the last 72 hours. Thyroid Function Tests: No results for input(s): TSH, T4TOTAL, FREET4, T3FREE, THYROIDAB in the last 72 hours. Anemia Panel: No results for input(s): VITAMINB12, FOLATE, FERRITIN, TIBC, IRON, RETICCTPCT in the last 72 hours. Urine analysis:    Component Value Date/Time   COLORURINE YELLOW 05/03/2017 1445   APPEARANCEUR CLEAR 05/03/2017 1445   LABSPEC 1.020 05/03/2017 1445   PHURINE 6.0 05/03/2017 1445   GLUCOSEU NEGATIVE 05/03/2017 1445   HGBUR TRACE (A) 05/03/2017 1445   BILIRUBINUR NEGATIVE 05/03/2017  1445   KETONESUR NEGATIVE 05/03/2017 1445   PROTEINUR 30 (A) 05/03/2017 1445   UROBILINOGEN 0.2 11/24/2013 1417   NITRITE NEGATIVE 05/03/2017 1445   LEUKOCYTESUR NEGATIVE 05/03/2017 1445   Recent Results (from the past 240 hour(s))  Culture, blood (Routine x 2)     Status: None (Preliminary result)   Collection Time: 05/03/17 10:30 AM  Result Value Ref Range Status   Specimen Description BLOOD RIGHT ARM  Final   Special Requests   Final    BOTTLES DRAWN AEROBIC AND ANAEROBIC Blood Culture adequate volume   Culture  Setup Time   Final    IN BOTH AEROBIC AND ANAEROBIC BOTTLES GRAM POSITIVE COCCI IN CHAINS CRITICAL RESULT CALLED TO, READ BACK BY AND VERIFIED WITH: BShirlee Latch [id number] 05/04/2017 T. TYSOR    Culture GRAM POSITIVE COCCI  Final   Report Status PENDING  Incomplete  Blood Culture ID  Panel (Reflexed)     Status: Abnormal   Collection Time: 05/03/17 10:30 AM  Result Value Ref Range Status   Enterococcus species NOT DETECTED NOT DETECTED Final   Vancomycin resistance NOT DETECTED NOT DETECTED Final   Listeria monocytogenes NOT DETECTED NOT DETECTED Final   Staphylococcus species NOT DETECTED NOT DETECTED Final   Staphylococcus aureus NOT DETECTED NOT DETECTED Final   Methicillin resistance NOT DETECTED NOT DETECTED Final   Streptococcus species DETECTED (A) NOT DETECTED Final    Comment: CRITICAL RESULT CALLED TO, READ BACK BY AND VERIFIED WITH: B. GREEN,PHARMD 0233 05/04/2017 T. TYSOR    Streptococcus agalactiae DETECTED (A) NOT DETECTED Final    Comment: CRITICAL RESULT CALLED TO, READ BACK BY AND VERIFIED WITH: B. GREEN,PHARMD 0233 05/04/2017 T. TYSOR    Streptococcus pneumoniae NOT DETECTED NOT DETECTED Final   Streptococcus pyogenes NOT DETECTED NOT DETECTED Final   Acinetobacter baumannii NOT DETECTED NOT DETECTED Final   Enterobacteriaceae species NOT DETECTED NOT DETECTED Final   Enterobacter cloacae complex NOT DETECTED NOT DETECTED Final   Escherichia coli NOT  DETECTED NOT DETECTED Final   Klebsiella oxytoca NOT DETECTED NOT DETECTED Final   Klebsiella pneumoniae NOT DETECTED NOT DETECTED Final   Proteus species NOT DETECTED NOT DETECTED Final   Serratia marcescens NOT DETECTED NOT DETECTED Final   Carbapenem resistance NOT DETECTED NOT DETECTED Final   Haemophilus influenzae NOT DETECTED NOT DETECTED Final   Neisseria meningitidis NOT DETECTED NOT DETECTED Final   Pseudomonas aeruginosa NOT DETECTED NOT DETECTED Final   Candida albicans NOT DETECTED NOT DETECTED Final   Candida glabrata NOT DETECTED NOT DETECTED Final   Candida krusei NOT DETECTED NOT DETECTED Final   Candida parapsilosis NOT DETECTED NOT DETECTED Final   Candida tropicalis NOT DETECTED NOT DETECTED Final  MRSA PCR Screening     Status: None   Collection Time: 05/03/17  8:37 PM  Result Value Ref Range Status   MRSA by PCR NEGATIVE NEGATIVE Final    Comment:        The GeneXpert MRSA Assay (FDA approved for NASAL specimens only), is one component of a comprehensive MRSA colonization surveillance program. It is not intended to diagnose MRSA infection nor to guide or monitor treatment for MRSA infections.       Radiology Studies: Dg Chest 2 View  Result Date: 05/03/2017 CLINICAL DATA:  Fever and cough over the last 4 days. EXAM: CHEST  2 VIEW COMPARISON:  01/15/2017 FINDINGS: heart size is normal. Mediastinal shadows are normal. There is newly seen patchy infiltrate and volume loss in both lung bases right more than left consistent with pneumonia. No dense consolidation or lobar collapse. No effusions. IMPRESSION: Newly seen infiltrate and atelectasis at the lung bases right more than left consistent with pneumonia. Electronically Signed   By: Nelson Chimes M.D.   On: 05/03/2017 11:09   Ct Angio Chest Pe W And/or Wo Contrast  Result Date: 05/03/2017 CLINICAL DATA:  Left shoulder pain. Unable to move the left arm. Cough, congestion, fever for 4 days EXAM: CT ANGIOGRAPHY  CHEST WITH CONTRAST TECHNIQUE: Multidetector CT imaging of the chest was performed using the standard protocol during bolus administration of intravenous contrast. Multiplanar CT image reconstructions and MIPs were obtained to evaluate the vascular anatomy. CONTRAST:  100 mL Isovue 370 COMPARISON:  None. FINDINGS: Cardiovascular: Satisfactory opacification of the pulmonary arteries to the segmental level. No evidence of pulmonary embolism. Mild cardiomegaly. No pericardial effusion. Normal caliber thoracic aorta. No thoracic aortic  dissection. Mediastinum/Nodes: Mild bilateral axillary lymphadenopathy. Largest right axillary lymph node measures 10 mm in short axis. Largest left axillary lymph node measures 11 mm in short axis. Mildly enlarged right lower paratracheal lymph node measuring 12 mm in short axis. Thyroid gland, trachea, and esophagus demonstrate no significant findings. Lungs/Pleura: Bibasilar atelectasis. No focal consolidation, pleural effusion or pneumothorax. Upper Abdomen: Splenomegaly.  No acute upper abdominal abnormality. Musculoskeletal: No acute osseous abnormality. No lytic or sclerotic osseous lesion. Review of the MIP images confirms the above findings. IMPRESSION: 1. No evidence pulmonary embolus. 2. No thoracic aortic dissection. 3. Mild axillary lymphadenopathy. Splenomegaly. These findings are in keeping with the patient's history of CLL. Electronically Signed   By: Kathreen Devoid   On: 05/03/2017 15:02   Ct Shoulder Left Wo Contrast  Result Date: 05/03/2017 CLINICAL DATA:  Left shoulder pain. EXAM: CT OF THE UPPER LEFT EXTREMITY WITHOUT CONTRAST TECHNIQUE: Multidetector CT imaging of the upper left extremity was performed according to the standard protocol. COMPARISON:  Left shoulder x-rays dated April 30, 2017. FINDINGS: Bones/Joint/Cartilage No acute osseous abnormality. The acromioclavicular and glenohumeral joint spaces are preserved. No glenohumeral joint effusion. Ligaments  Suboptimally assessed by CT. Muscles and Tendons Normal muscle bulk.  The rotator cuff is grossly intact. Soft tissues Multiple prominent and enlarged left axillary and subpectoral lymph nodes measuring up to 1.3 cm in short axis. Multiple prominent lower cervical lymph nodes, incompletely evaluated. Soft tissues are otherwise unremarkable. The visualized left lung is clear. IMPRESSION: 1. No acute osseous abnormality or significant degenerative changes. 2. Left axillary and subpectoral lymphadenopathy, likely related to patient's history of chronic lymphocytic leukemia. Electronically Signed   By: Titus Dubin M.D.   On: 05/03/2017 14:56    Scheduled Meds: . dicyclomine  10 mg Oral TID AC  . enoxaparin (LOVENOX) injection  40 mg Subcutaneous QHS  . loratadine  10 mg Oral Daily  . mouth rinse  15 mL Mouth Rinse BID  . multivitamin with minerals  1 tablet Oral Daily  . naproxen  500 mg Oral BID WC  . pantoprazole  40 mg Oral Daily  . vitamin C  125 mg Oral Daily  . vitamin E  400 Units Oral Daily   Continuous Infusions: . sodium chloride 100 mL/hr at 05/03/17 2216  . azithromycin    . cefTRIAXone (ROCEPHIN)  IV Stopped (05/04/17 0548)     LOS: 1 day   Time spent: 25 minutes.  Vance Gather, MD [facility] Pager [phone]  If 7PM-7AM, please contact night-coverage [email or web address] Password [email or web address] 05/04/2017, 9:07 AM

## 2017-05-04 NOTE — Care Management Note (Signed)
Case Management Note  Patient Details  Name: Todd Novak MRN: [id number] Date of Birth: 1966-02-22  Subjective/Objective:                  sepsis  Action/Plan: Date:  May 04, 2017 Chart reviewed for concurrent status and case management needs.  Will continue to follow patient progress.  Discharge Planning: following for needs  Expected discharge date: [id number]  Velva Harman, BSN, [place], [place]   Expected Discharge Date:                  Expected Discharge Plan:  Home/Self Care  In-House Referral:     Discharge planning Services  Mobile Meals, CM Consult  Post Acute Care Choice:    Choice offered to:     DME Arranged:    DME Agency:     HH Arranged:    [place] Agency:     Status of Service:  In process, will continue to follow  If discussed at Long Length of Stay Meetings, dates discussed:    Additional Comments:  Leeroy Cha, RN 05/04/2017, 9:30 AM

## 2017-05-04 NOTE — Progress Notes (Signed)
  PHARMACY - PHYSICIAN COMMUNICATION CRITICAL VALUE ALERT - BLOOD CULTURE IDENTIFICATION (BCID)  Results for orders placed or performed during the hospital encounter of 05/03/17  Blood Culture ID Panel (Reflexed) (Collected: 05/03/2017 10:30 AM)  Result Value Ref Range   Enterococcus species NOT DETECTED NOT DETECTED   Vancomycin resistance NOT DETECTED NOT DETECTED   Listeria monocytogenes NOT DETECTED NOT DETECTED   Staphylococcus species NOT DETECTED NOT DETECTED   Staphylococcus aureus NOT DETECTED NOT DETECTED   Methicillin resistance NOT DETECTED NOT DETECTED   Streptococcus species DETECTED (A) NOT DETECTED   Streptococcus agalactiae DETECTED (A) NOT DETECTED   Streptococcus pneumoniae NOT DETECTED NOT DETECTED   Streptococcus pyogenes NOT DETECTED NOT DETECTED   Acinetobacter baumannii NOT DETECTED NOT DETECTED   Enterobacteriaceae species NOT DETECTED NOT DETECTED   Enterobacter cloacae complex NOT DETECTED NOT DETECTED   Escherichia coli NOT DETECTED NOT DETECTED   Klebsiella oxytoca NOT DETECTED NOT DETECTED   Klebsiella pneumoniae NOT DETECTED NOT DETECTED   Proteus species NOT DETECTED NOT DETECTED   Serratia marcescens NOT DETECTED NOT DETECTED   Carbapenem resistance NOT DETECTED NOT DETECTED   Haemophilus influenzae NOT DETECTED NOT DETECTED   Neisseria meningitidis NOT DETECTED NOT DETECTED   Pseudomonas aeruginosa NOT DETECTED NOT DETECTED   Candida albicans NOT DETECTED NOT DETECTED   Candida glabrata NOT DETECTED NOT DETECTED   Candida krusei NOT DETECTED NOT DETECTED   Candida parapsilosis NOT DETECTED NOT DETECTED   Candida tropicalis NOT DETECTED NOT DETECTED    Name of physician (or Provider) Contacted: M Arrien   Changes to prescribed antibiotics required: Algorithm recommends PCN G. Increase Rocephin to 2 Gm IV q24h for Strep agalactiae bacteremia?  Dorrene German 05/04/2017  2:39 AM

## 2017-05-05 ENCOUNTER — Inpatient Hospital Stay (HOSPITAL_COMMUNITY): Payer: BLUE CROSS/BLUE SHIELD

## 2017-05-05 DIAGNOSIS — M7582 Other shoulder lesions, left shoulder: Secondary | ICD-10-CM

## 2017-05-05 DIAGNOSIS — I340 Nonrheumatic mitral (valve) insufficiency: Secondary | ICD-10-CM

## 2017-05-05 LAB — BASIC METABOLIC PANEL
Anion gap: 8 (ref 5–15)
BUN: 13 mg/dL (ref 6–20)
CO2: 25 mmol/L (ref 22–32)
Calcium: 8.5 mg/dL — ABNORMAL LOW (ref 8.9–10.3)
Chloride: 106 mmol/L (ref 101–111)
Creatinine, Ser: 0.91 mg/dL (ref 0.61–1.24)
GFR calc Af Amer: >60 mL/min (ref >60)
GFR calc non Af Amer: >60 mL/min (ref >60)
Glucose, Bld: 117 mg/dL — ABNORMAL HIGH (ref 65–99)
Potassium: 3.9 mmol/L (ref 3.5–5.1)
Sodium: 139 mmol/L (ref 135–145)

## 2017-05-05 LAB — URINE CULTURE: Culture: NO GROWTH

## 2017-05-05 LAB — CBC
HCT: 30.7 % — ABNORMAL LOW (ref 39.0–52.0)
Hemoglobin: 10.4 g/dL — ABNORMAL LOW (ref 13.0–17.0)
MCH: 31.4 pg (ref 26.0–34.0)
MCHC: 33.9 g/dL (ref 30.0–36.0)
MCV: 92.7 fL (ref 78.0–100.0)
Platelets: 110 10*3/uL — ABNORMAL LOW (ref 150–400)
RBC: 3.31 MIL/uL — ABNORMAL LOW (ref 4.22–5.81)
RDW: 14 % (ref 11.5–15.5)
WBC: 17 10*3/uL — ABNORMAL HIGH (ref 4.0–10.5)

## 2017-05-05 MED ORDER — POLYETHYLENE GLYCOL 3350 17 G PO PACK: 17.0000 g | PACK | Freq: Every day | ORAL | Status: DC | PRN

## 2017-05-05 MED ORDER — GADOBENATE DIMEGLUMINE 529 MG/ML IV SOLN
20.0000 mL | Freq: Once | INTRAVENOUS | Status: AC | PRN
  Administered 2017-05-05: 19 mL via INTRAVENOUS

## 2017-05-05 MED ORDER — SENNOSIDES-DOCUSATE SODIUM 8.6-50 MG PO TABS
1.0000 | ORAL_TABLET | Freq: Every day | ORAL | Status: DC
  Administered 2017-05-05 – 2017-05-08 (×4): 1 via ORAL
  Filled 2017-05-05 (×4): qty 1

## 2017-05-05 MED ORDER — LISINOPRIL 20 MG PO TABS
20.0000 mg | ORAL_TABLET | Freq: Every day | ORAL | Status: DC
  Administered 2017-05-05 – 2017-05-08 (×4): 20 mg via ORAL
  Filled 2017-05-05 (×4): qty 1

## 2017-05-05 NOTE — Progress Notes (Signed)
    [place] for Infectious Disease   Reason for visit: Follow up on bacteremia  Interval History: TTE done and read pending; MRI with CLL bone infiltrative disease  Will repeat blood cultures, see TTE and should get TEE if no significant findings on TTE.

## 2017-05-05 NOTE — Progress Notes (Signed)
PROGRESS NOTE  Todd Novak  YQI:[id number] DOB: 18-May-1966 DOA: 05/03/2017 PCP: Debbrah Alar, NP  Brief Narrative: Todd Novak is a 51 y.o. left handed male with a history of CLL who presented with left shoulder pain, fever, chills and cough. He had been having severe chills and fever with persistent cough starting 2 weeks prior while on a business trip in [place] which seemed to improve some, though he's continued to have a nonproductive cough. He also noted gradual onset of severe left shoulder pain recently with no known preceding trauma, though he had lifted a larger freezer a few days prior. He presented to his PCP for this and had negative XR, received steroid injection, muscle relaxers without improvement in symptoms. He also has diffuse joint and body pains, and presented due to this and increasingly restricted ROM with recurrent fevers up to nearly 104F. On arrival he was febrile to 103.17F with WBC 11.7, CXR read as bibasilar pneumonia. Flu negative. D-dimer elevated with no evidence of PE, dissection, or infiltrate on CTA chest. Broad spectrum antibiotics were started and he was transferred to [facility] for ongoing care. MRI showed rotator cuff tear without septic joint. Blood cultures positive for S. agalactiae. Echo pending.   Assessment & Plan: Principal Problem:   Sepsis due to Streptococcus agalactiae (HCC) Active Problems:   HTN (hypertension)   GERD (gastroesophageal reflux disease)   CLL (chronic lymphocytic leukemia) (HCC)   Acute pain of left shoulder  Sepsis due to GBS bacteremia in immunocompromised host:  - Ceftriaxone IV per ID - Echo pending, will get TEE if unrevealing  - Following blood cultures for sensitivities.  Left infraspinatus tendinosis: MRI without joint effusion or osteomyelitis.   - PT consulted. ROM improving.  - Will Tx with tylenol, tramadol, toradol.   CLL: Currently on active surveillance at [facility] - Monitor  HTN:  - Ok to restart  lisinopril  Dyspepsia: Chronic, stable.  - Continue PPI  Thrombocytopenia: Mild, chronic due to ITP Dx 2013.  - Monitor platelet counts.   DVT prophylaxis: Lovenox Code Status: Full Family Communication: Wife at bedside Disposition Plan: Continue inpatient management. Anticipate DC home once improving and work up completed.   Consultants:   ID, Dr. Linus Salmons  Procedures:   None  Antimicrobials:  Vanc/zosyn x1 10/7  Azithromycin 10/7   Ceftriaxone 10/7 >>   Subjective: Left shoulder pain improving with wider ROM. Cough improved, no fevers or chills in past 24 hours. No chest pain or dyspnea.   Objective: BP 128/86 (BP Location: Right Arm)   Pulse 65   Temp 98.4 F (36.9 C) (Oral)   Resp 20   Ht 5\' 9"  (1.753 m)   Wt 89.9 kg (198 lb 3.1 oz)   SpO2 98%   BMI 29.27 kg/m   Gen: 51 y.o. male in no distress Pulm: Non-labored breathing room air. Clear breath sounds. CV: Regular rate and rhythm. II/VI systolic murmur, rub, or gallop. No JVD, no LE edema. GI: Abdomen soft, non-tender, non-distended, with normoactive bowel sounds. No organomegaly or masses felt. Ext: Left shoulder with better AROM specifically AB duction flexion and rxternal rotation. Generalized tenderness. No warmth or erythema. Skin: No rashes, lesions no ulcers Neuro: Alert and oriented. No focal neurological deficits. Psych: Judgement and insight appear normal. Mood & affect appropriate.   CBC:  Recent Labs Lab 05/03/17 1030 05/04/17 0334 05/05/17 0540  WBC 11.7* 18.8* 17.0*  NEUTROABS 3.2  --   --   HGB 13.1 11.4* 10.4*  HCT 38.1*  33.9* 30.7*  MCV 91.1 92.6 92.7  PLT 127* 110* 211*   Basic Metabolic Panel:  Recent Labs Lab 05/03/17 1030 05/04/17 0334 05/05/17 0540  NA 130* 135 139  K 3.9 4.0 3.9  CL 98* 103 106  CO2 23 23 25   GLUCOSE 154* 131* 117*  BUN 15 13 13   CREATININE 0.94 0.94 0.91  CALCIUM 9.0 8.3* 8.5*   Liver Function Tests:  Recent Labs Lab 05/03/17 1030  05/04/17 0334  AST 43* 75*  ALT 54 92*  ALKPHOS 108 103  BILITOT 0.7 0.5  PROT 7.2 5.9*  ALBUMIN 3.5 2.9*   Urine analysis:    Component Value Date/Time   COLORURINE YELLOW 05/03/2017 1445   APPEARANCEUR CLEAR 05/03/2017 1445   LABSPEC 1.020 05/03/2017 1445   PHURINE 6.0 05/03/2017 1445   GLUCOSEU NEGATIVE 05/03/2017 1445   HGBUR TRACE (A) 05/03/2017 1445   BILIRUBINUR NEGATIVE 05/03/2017 1445   KETONESUR NEGATIVE 05/03/2017 1445   PROTEINUR 30 (A) 05/03/2017 1445   UROBILINOGEN 0.2 11/24/2013 1417   NITRITE NEGATIVE 05/03/2017 1445   LEUKOCYTESUR NEGATIVE 05/03/2017 1445   Recent Results (from the past 240 hour(s))  Culture, blood (Routine x 2)     Status: Abnormal (Preliminary result)   Collection Time: 05/03/17 10:30 AM  Result Value Ref Range Status   Specimen Description BLOOD RIGHT ARM  Final   Special Requests   Final    BOTTLES DRAWN AEROBIC AND ANAEROBIC Blood Culture adequate volume   Culture  Setup Time   Final    IN BOTH AEROBIC AND ANAEROBIC BOTTLES GRAM POSITIVE COCCI IN CHAINS CRITICAL RESULT CALLED TO, READ BACK BY AND VERIFIED WITH: B. GREEN,PHARMD [id number] 05/04/2017 T. TYSOR    Culture (A)  Final    GROUP B STREP(S.AGALACTIAE)ISOLATED SUSCEPTIBILITIES TO FOLLOW Performed at [facility], [address], [place], [place] [address]    Report Status PENDING  Incomplete  Blood Culture ID Panel (Reflexed)     Status: Abnormal   Collection Time: 05/03/17 10:30 AM  Result Value Ref Range Status   Enterococcus species NOT DETECTED NOT DETECTED Final   Vancomycin resistance NOT DETECTED NOT DETECTED Final   Listeria monocytogenes NOT DETECTED NOT DETECTED Final   Staphylococcus species NOT DETECTED NOT DETECTED Final   Staphylococcus aureus NOT DETECTED NOT DETECTED Final   Methicillin resistance NOT DETECTED NOT DETECTED Final   Streptococcus species DETECTED (A) NOT DETECTED Final    Comment: CRITICAL RESULT CALLED TO, READ BACK BY AND VERIFIED  WITH: B. GREEN,PHARMD [id number] 05/04/2017 T. TYSOR    Streptococcus agalactiae DETECTED (A) NOT DETECTED Final    Comment: CRITICAL RESULT CALLED TO, READ BACK BY AND VERIFIED WITH: B. GREEN,PHARMD [id number] 05/04/2017 T. TYSOR    Streptococcus pneumoniae NOT DETECTED NOT DETECTED Final   Streptococcus pyogenes NOT DETECTED NOT DETECTED Final   Acinetobacter baumannii NOT DETECTED NOT DETECTED Final   Enterobacteriaceae species NOT DETECTED NOT DETECTED Final   Enterobacter cloacae complex NOT DETECTED NOT DETECTED Final   Escherichia coli NOT DETECTED NOT DETECTED Final   Klebsiella oxytoca NOT DETECTED NOT DETECTED Final   Klebsiella pneumoniae NOT DETECTED NOT DETECTED Final   Proteus species NOT DETECTED NOT DETECTED Final   Serratia marcescens NOT DETECTED NOT DETECTED Final   Carbapenem resistance NOT DETECTED NOT DETECTED Final   Haemophilus influenzae NOT DETECTED NOT DETECTED Final   Neisseria meningitidis NOT DETECTED NOT DETECTED Final   Pseudomonas aeruginosa NOT DETECTED NOT DETECTED Final   Candida albicans  NOT DETECTED NOT DETECTED Final   Candida glabrata NOT DETECTED NOT DETECTED Final   Candida krusei NOT DETECTED NOT DETECTED Final   Candida parapsilosis NOT DETECTED NOT DETECTED Final   Candida tropicalis NOT DETECTED NOT DETECTED Final  Culture, blood (Routine x 2)     Status: Abnormal (Preliminary result)   Collection Time: 05/03/17 12:32 PM  Result Value Ref Range Status   Specimen Description BLOOD RIGHT FOREARM  Final   Special Requests   Final    BOTTLES DRAWN AEROBIC AND ANAEROBIC Blood Culture adequate volume   Culture  Setup Time   Final    GRAM POSITIVE COCCI IN CHAINS IN BOTH AEROBIC AND ANAEROBIC BOTTLES CRITICAL RESULT CALLED TO, READ BACK BY AND VERIFIED WITH: Jerilynn Mages RENZ,PHARMD AT 0865 05/04/17 BY L BENFIELD Performed at [facility], [place] [address], [place], [place] [address]    Culture GROUP B STREP(S.AGALACTIAE)ISOLATED (A)  Final   Report Status  PENDING  Incomplete  Urine culture     Status: None   Collection Time: 05/03/17  2:45 PM  Result Value Ref Range Status   Specimen Description URINE, RANDOM  Final   Special Requests NONE  Final   Culture   Final    NO GROWTH Performed at [facility], [place] [address] [place], [place] [address]    Report Status 05/05/2017 FINAL  Final  MRSA PCR Screening     Status: None   Collection Time: 05/03/17  8:37 PM  Result Value Ref Range Status   MRSA by PCR NEGATIVE NEGATIVE Final    Comment:        The GeneXpert MRSA Assay (FDA approved for NASAL specimens only), is one component of a comprehensive MRSA colonization surveillance program. It is not intended to diagnose MRSA infection nor to guide or monitor treatment for MRSA infections.       Radiology Studies: Ct Angio Chest Pe W And/or Wo Contrast  Result Date: 05/03/2017 CLINICAL DATA:  Left shoulder pain. Unable to move the left arm. Cough, congestion, fever for 4 days EXAM: CT ANGIOGRAPHY CHEST WITH CONTRAST TECHNIQUE: Multidetector CT imaging of the chest was performed using the standard protocol during bolus administration of intravenous contrast. Multiplanar CT image reconstructions and MIPs were obtained to evaluate the vascular anatomy. CONTRAST:  100 mL Isovue 370 COMPARISON:  None. FINDINGS: Cardiovascular: Satisfactory opacification of the pulmonary arteries to the segmental level. No evidence of pulmonary embolism. Mild cardiomegaly. No pericardial effusion. Normal caliber thoracic aorta. No thoracic aortic dissection. Mediastinum/Nodes: Mild bilateral axillary lymphadenopathy. Largest right axillary lymph node measures 10 mm in short axis. Largest left axillary lymph node measures 11 mm in short axis. Mildly enlarged right lower paratracheal lymph node measuring 12 mm in short axis. Thyroid gland, trachea, and esophagus demonstrate no significant findings. Lungs/Pleura: Bibasilar atelectasis. No focal consolidation,  pleural effusion or pneumothorax. Upper Abdomen: Splenomegaly.  No acute upper abdominal abnormality. Musculoskeletal: No acute osseous abnormality. No lytic or sclerotic osseous lesion. Review of the MIP images confirms the above findings. IMPRESSION: 1. No evidence pulmonary embolus. 2. No thoracic aortic dissection. 3. Mild axillary lymphadenopathy. Splenomegaly. These findings are in keeping with the patient's history of CLL. Electronically Signed   By: Kathreen Devoid   On: 05/03/2017 15:02   Ct Shoulder Left Wo Contrast  Result Date: 05/03/2017 CLINICAL DATA:  Left shoulder pain. EXAM: CT OF THE UPPER LEFT EXTREMITY WITHOUT CONTRAST TECHNIQUE: Multidetector CT imaging of the upper left extremity was performed  according to the standard protocol. COMPARISON:  Left shoulder x-rays dated April 30, 2017. FINDINGS: Bones/Joint/Cartilage No acute osseous abnormality. The acromioclavicular and glenohumeral joint spaces are preserved. No glenohumeral joint effusion. Ligaments Suboptimally assessed by CT. Muscles and Tendons Normal muscle bulk.  The rotator cuff is grossly intact. Soft tissues Multiple prominent and enlarged left axillary and subpectoral lymph nodes measuring up to 1.3 cm in short axis. Multiple prominent lower cervical lymph nodes, incompletely evaluated. Soft tissues are otherwise unremarkable. The visualized left lung is clear. IMPRESSION: 1. No acute osseous abnormality or significant degenerative changes. 2. Left axillary and subpectoral lymphadenopathy, likely related to patient's history of chronic lymphocytic leukemia. Electronically Signed   By: Titus Dubin M.D.   On: 05/03/2017 14:56    Scheduled Meds: . dicyclomine  10 mg Oral TID AC  . enoxaparin (LOVENOX) injection  40 mg Subcutaneous QHS  . loratadine  10 mg Oral Daily  . mouth rinse  15 mL Mouth Rinse BID  . multivitamin with minerals  1 tablet Oral Daily  . naproxen  500 mg Oral BID WC  . pantoprazole  40 mg Oral Daily   . vitamin C  125 mg Oral Daily  . vitamin E  400 Units Oral Daily   Continuous Infusions: . sodium chloride 100 mL/hr at 05/04/17 2103  . cefTRIAXone (ROCEPHIN)  IV Stopped (05/05/17 0941)     LOS: 2 days   Time spent: 25 minutes.  Vance Gather, MD [facility] Pager [phone]  If 7PM-7AM, please contact night-coverage [email or web address] Password [email or web address] 05/05/2017, 1:18 PM

## 2017-05-05 NOTE — Care Management Note (Signed)
Case Management Note  Patient Details  Name: Lavert Matousek MRN: [id number] Date of Birth: Jul 19, 1966  Subjective/Objective: 51 y/o m admitted w/Bacteremia d/t S. Agalactiae. From home. ID following. PT cons-await recc.                   Action/Plan:d/c plan home.   Expected Discharge Date:                  Expected Discharge Plan:  Home/Self Care  In-House Referral:     Discharge planning Services  Mobile Meals, CM Consult  Post Acute Care Choice:    Choice offered to:     DME Arranged:    DME Agency:     HH Arranged:    HH Agency:     Status of Service:  In process, will continue to follow  If discussed at Long Length of Stay Meetings, dates discussed:    Additional Comments:  Dessa Phi, RN 05/05/2017, 6:02 PM

## 2017-05-05 NOTE — Progress Notes (Signed)
  Echocardiogram 2D Echocardiogram has been performed.  Tresa Res 05/05/2017, 3:40 PM

## 2017-05-06 ENCOUNTER — Ambulatory Visit (HOSPITAL_COMMUNITY): Admission: RE | Admit: 2017-05-06 | Payer: BLUE CROSS/BLUE SHIELD | Source: Ambulatory Visit | Admitting: Cardiology

## 2017-05-06 ENCOUNTER — Inpatient Hospital Stay (HOSPITAL_COMMUNITY): Payer: BLUE CROSS/BLUE SHIELD | Admitting: Anesthesiology

## 2017-05-06 ENCOUNTER — Encounter (HOSPITAL_COMMUNITY): Payer: Self-pay | Admitting: Anesthesiology

## 2017-05-06 ENCOUNTER — Encounter (HOSPITAL_COMMUNITY)
Admission: EM | Disposition: A | Discharge status: Home Health | Payer: Self-pay | Source: Home / Self Care | Attending: Family Medicine

## 2017-05-06 ENCOUNTER — Inpatient Hospital Stay (HOSPITAL_COMMUNITY): Payer: BLUE CROSS/BLUE SHIELD

## 2017-05-06 ENCOUNTER — Other Ambulatory Visit (HOSPITAL_COMMUNITY): Payer: BLUE CROSS/BLUE SHIELD

## 2017-05-06 DIAGNOSIS — I340 Nonrheumatic mitral (valve) insufficiency: Secondary | ICD-10-CM

## 2017-05-06 DIAGNOSIS — M25519 Pain in unspecified shoulder: Secondary | ICD-10-CM

## 2017-05-06 HISTORY — PX: TEE WITHOUT CARDIOVERSION: SHX5443

## 2017-05-06 LAB — CULTURE, BLOOD (ROUTINE X 2)
Special Requests: ADEQUATE
Special Requests: ADEQUATE

## 2017-05-06 LAB — ECHOCARDIOGRAM COMPLETE
Height: 69 in
Weight: 3171.1 oz

## 2017-05-06 SURGERY — ECHOCARDIOGRAM, TRANSESOPHAGEAL
Anesthesia: Monitor Anesthesia Care

## 2017-05-06 MED ORDER — BUTAMBEN-TETRACAINE-BENZOCAINE 2-2-14 % EX AERO
INHALATION_SPRAY | CUTANEOUS | Status: DC | PRN
  Administered 2017-05-06: 2 via TOPICAL

## 2017-05-06 MED ORDER — SODIUM CHLORIDE 0.9 % IV SOLN: INTRAVENOUS | Status: DC

## 2017-05-06 MED ORDER — PROPOFOL 10 MG/ML IV BOLUS
INTRAVENOUS | Status: DC | PRN
  Administered 2017-05-06: 20 mg via INTRAVENOUS

## 2017-05-06 MED ORDER — PROPOFOL 500 MG/50ML IV EMUL
INTRAVENOUS | Status: DC | PRN
  Administered 2017-05-06: 100 ug/kg/min via INTRAVENOUS

## 2017-05-06 MED ORDER — SODIUM CHLORIDE 0.9 % IV SOLN
INTRAVENOUS | Status: DC | PRN
  Administered 2017-05-06: 12:00:00 via INTRAVENOUS

## 2017-05-06 NOTE — Care Management Note (Signed)
Case Management Note  Patient Details  Name: Todd Novak MRN: [id number] Date of Birth: 07-May-1966  Subjective/Objective: [facility] iv infusion rep Pam already following for potential long term iv abx. ID following.                   Action/Plan:d/c plan home.   Expected Discharge Date:                  Expected Discharge Plan:  [place]  In-House Referral:     Discharge planning Services  Mobile Meals, CM Consult  Post Acute Care Choice:    Choice offered to:  Patient  DME Arranged:    DME Agency:     HH Arranged:    [place] Agency:     Status of Service:  In process, will continue to follow  If discussed at Long Length of Stay Meetings, dates discussed:    Additional Comments:  Dessa Phi, RN 05/06/2017, 3:51 PM

## 2017-05-06 NOTE — Progress Notes (Signed)
PROGRESS NOTE  Todd Novak  GLO:[id number] DOB: May 22, 1966 DOA: 05/03/2017 PCP: Debbrah Alar, NP  Brief Narrative: Todd Novak is a 51 y.o. left handed male with a history of CLL who presented with left shoulder pain, fever, chills and cough. He had been having severe chills and fever with persistent cough starting 2 weeks prior while on a business trip in [place] which seemed to improve some, though he's continued to have a nonproductive cough. He also noted gradual onset of severe left shoulder pain recently with no known preceding trauma, though he had lifted a larger freezer a few days prior. He presented to his PCP for this and had negative XR, received steroid injection, muscle relaxers without improvement in symptoms. He also has diffuse joint and body pains, and presented due to this and increasingly restricted ROM with recurrent fevers up to nearly 104F. On arrival he was febrile to 103.43F with WBC 11.7, CXR read as bibasilar pneumonia. Flu negative. D-dimer elevated with no evidence of PE, dissection, or infiltrate on CTA chest. Broad spectrum antibiotics were started and he was transferred to [facility] for ongoing care. MRI showed rotator cuff tear without septic joint. Blood cultures positive for S. agalactiae. Echo then TEE showed no vegetations. Repeat blood cultures have been drawn on 10/10. If they remain negative at 48 hours, will insert PIC line and complete 7 more days of IV antibiotics at home.    Assessment & Plan: Principal Problem:   Sepsis due to Streptococcus agalactiae (HCC) Active Problems:   HTN (hypertension)   GERD (gastroesophageal reflux disease)   CLL (chronic lymphocytic leukemia) (HCC)   Acute pain of left shoulder  Sepsis due to GBS bacteremia in immunocompromised host: Pan-sensitive cultures. TEE without vegetations. No further fevers, subjectively improved.  - Ceftriaxone IV per ID - Follow up repeat blood cultures from 10/10 and insert PICC if  negative at 48 hours, then DC to home for 7 more days of IV antibiotics   Left infraspinatus tendinosis: MRI without joint effusion or osteomyelitis.   - PT consulted. ROM improving.  - Will Tx with tylenol, tramadol, toradol.   CLL: Currently on active surveillance at [facility] - Monitor  HTN:  - Continue lisinopril  Dyspepsia: Chronic, stable.  - Continue PPI  Thrombocytopenia: Mild, chronic due to ITP Dx 2013.  - Monitor platelet counts.   DVT prophylaxis: Lovenox Code Status: Full Family Communication: Wife at bedside Disposition Plan: Insert PICC if blood cultures negative at 48 hours, then DC to home for 7 more days of IV antibiotics   Consultants:   ID, Dr. Linus Salmons  Procedures:   None  Antimicrobials:  Vanc/zosyn x1 10/7  Azithromycin 10/7   Ceftriaxone 10/7 >>   Subjective: Left shoulder pain continues to improve. No fevers, chills. Cough with chest irritation stable. No dyspnea.   Objective: BP 131/83 (BP Location: Left Arm)   Pulse 90   Temp 98.3 F (36.8 C) (Oral)   Resp 18   Ht 5\' 9"  (1.753 m)   Wt 89.8 kg (198 lb)   SpO2 99%   BMI 29.24 kg/m   Gen: 51 y.o. male in no distress Pulm: Non-labored breathing room air. Clear breath sounds. CV: Regular rate and rhythm. II/VI systolic murmur, rub, or gallop. No JVD, no LE edema. Ext: Left shoulder with better AROM, abducting to 38F improved diffuse tenderness. No warmth or erythema. Skin: No rashes, lesions no ulcers Neuro: Alert and oriented. No focal neurological deficits. Psych: Judgement and insight appear normal. Mood &  affect appropriate.   CBC:  Recent Labs Lab 05/03/17 1030 05/04/17 0334 05/05/17 0540  WBC 11.7* 18.8* 17.0*  NEUTROABS 3.2  --   --   HGB 13.1 11.4* 10.4*  HCT 38.1* 33.9* 30.7*  MCV 91.1 92.6 92.7  PLT 127* 110* 622*   Basic Metabolic Panel:  Recent Labs Lab 05/03/17 1030 05/04/17 0334 05/05/17 0540  NA 130* 135 139  K 3.9 4.0 3.9  CL 98* 103 106  CO2 23 23 25     GLUCOSE 154* 131* 117*  BUN 15 13 13   CREATININE 0.94 0.94 0.91  CALCIUM 9.0 8.3* 8.5*   Liver Function Tests:  Recent Labs Lab 05/03/17 1030 05/04/17 0334  AST 43* 75*  ALT 54 92*  ALKPHOS 108 103  BILITOT 0.7 0.5  PROT 7.2 5.9*  ALBUMIN 3.5 2.9*   Urine analysis:    Component Value Date/Time   COLORURINE YELLOW 05/03/2017 1445   APPEARANCEUR CLEAR 05/03/2017 1445   LABSPEC 1.020 05/03/2017 1445   PHURINE 6.0 05/03/2017 1445   GLUCOSEU NEGATIVE 05/03/2017 1445   HGBUR TRACE (A) 05/03/2017 1445   BILIRUBINUR NEGATIVE 05/03/2017 1445   KETONESUR NEGATIVE 05/03/2017 1445   PROTEINUR 30 (A) 05/03/2017 1445   UROBILINOGEN 0.2 11/24/2013 1417   NITRITE NEGATIVE 05/03/2017 1445   LEUKOCYTESUR NEGATIVE 05/03/2017 1445   Recent Results (from the past 240 hour(s))  Culture, blood (Routine x 2)     Status: Abnormal   Collection Time: 05/03/17 10:30 AM  Result Value Ref Range Status   Specimen Description BLOOD RIGHT ARM  Final   Special Requests   Final    BOTTLES DRAWN AEROBIC AND ANAEROBIC Blood Culture adequate volume   Culture  Setup Time   Final    IN BOTH AEROBIC AND ANAEROBIC BOTTLES GRAM POSITIVE COCCI IN CHAINS CRITICAL RESULT CALLED TO, READ BACK BY AND VERIFIED WITH: BShirlee Latch [id number] 05/04/2017 T. TYSOR    Culture GROUP B STREP(S.AGALACTIAE)ISOLATED (A)  Final   Report Status 05/06/2017 FINAL  Final   Organism ID, Bacteria GROUP B STREP(S.AGALACTIAE)ISOLATED  Final      Susceptibility   Group b strep(s.agalactiae)isolated - MIC*    CLINDAMYCIN <=0.25 SENSITIVE Sensitive     AMPICILLIN <=0.25 SENSITIVE Sensitive     ERYTHROMYCIN <=0.12 SENSITIVE Sensitive     VANCOMYCIN 0.5 SENSITIVE Sensitive     CEFTRIAXONE <=0.12 SENSITIVE Sensitive     LEVOFLOXACIN 0.5 SENSITIVE Sensitive     * GROUP B STREP(S.AGALACTIAE)ISOLATED  Blood Culture ID Panel (Reflexed)     Status: Abnormal   Collection Time: 05/03/17 10:30 AM  Result Value Ref Range Status    Enterococcus species NOT DETECTED NOT DETECTED Final   Vancomycin resistance NOT DETECTED NOT DETECTED Final   Listeria monocytogenes NOT DETECTED NOT DETECTED Final   Staphylococcus species NOT DETECTED NOT DETECTED Final   Staphylococcus aureus NOT DETECTED NOT DETECTED Final   Methicillin resistance NOT DETECTED NOT DETECTED Final   Streptococcus species DETECTED (A) NOT DETECTED Final    Comment: CRITICAL RESULT CALLED TO, READ BACK BY AND VERIFIED WITH: B. GREEN,PHARMD 0233 05/04/2017 T. TYSOR    Streptococcus agalactiae DETECTED (A) NOT DETECTED Final    Comment: CRITICAL RESULT CALLED TO, READ BACK BY AND VERIFIED WITH: B. GREEN,PHARMD [id number] 05/04/2017 T. TYSOR    Streptococcus pneumoniae NOT DETECTED NOT DETECTED Final   Streptococcus pyogenes NOT DETECTED NOT DETECTED Final   Acinetobacter baumannii NOT DETECTED NOT DETECTED Final   Enterobacteriaceae species NOT DETECTED NOT DETECTED Final  Enterobacter cloacae complex NOT DETECTED NOT DETECTED Final   Escherichia coli NOT DETECTED NOT DETECTED Final   Klebsiella oxytoca NOT DETECTED NOT DETECTED Final   Klebsiella pneumoniae NOT DETECTED NOT DETECTED Final   Proteus species NOT DETECTED NOT DETECTED Final   Serratia marcescens NOT DETECTED NOT DETECTED Final   Carbapenem resistance NOT DETECTED NOT DETECTED Final   Haemophilus influenzae NOT DETECTED NOT DETECTED Final   Neisseria meningitidis NOT DETECTED NOT DETECTED Final   Pseudomonas aeruginosa NOT DETECTED NOT DETECTED Final   Candida albicans NOT DETECTED NOT DETECTED Final   Candida glabrata NOT DETECTED NOT DETECTED Final   Candida krusei NOT DETECTED NOT DETECTED Final   Candida parapsilosis NOT DETECTED NOT DETECTED Final   Candida tropicalis NOT DETECTED NOT DETECTED Final  Culture, blood (Routine x 2)     Status: Abnormal   Collection Time: 05/03/17 12:32 PM  Result Value Ref Range Status   Specimen Description BLOOD RIGHT FOREARM  Final   Special Requests    Final    BOTTLES DRAWN AEROBIC AND ANAEROBIC Blood Culture adequate volume   Culture  Setup Time   Final    GRAM POSITIVE COCCI IN CHAINS IN BOTH AEROBIC AND ANAEROBIC BOTTLES CRITICAL RESULT CALLED TO, READ BACK BY AND VERIFIED WITH: M RENZ,PHARMD AT 0908 05/04/17 BY L BENFIELD    Culture (A)  Final    GROUP B STREP(S.AGALACTIAE)ISOLATED SUSCEPTIBILITIES PERFORMED ON PREVIOUS CULTURE WITHIN THE LAST 5 DAYS. Performed at [facility], [place] [address], [place], [place] [address]    Report Status 05/06/2017 FINAL  Final  Urine culture     Status: None   Collection Time: 05/03/17  2:45 PM  Result Value Ref Range Status   Specimen Description URINE, RANDOM  Final   Special Requests NONE  Final   Culture   Final    NO GROWTH Performed at [facility], [place] [address], [place], [place] [address]    Report Status 05/05/2017 FINAL  Final  MRSA PCR Screening     Status: None   Collection Time: 05/03/17  8:37 PM  Result Value Ref Range Status   MRSA by PCR NEGATIVE NEGATIVE Final    Comment:        The GeneXpert MRSA Assay (FDA approved for NASAL specimens only), is one component of a comprehensive MRSA colonization surveillance program. It is not intended to diagnose MRSA infection nor to guide or monitor treatment for MRSA infections.       Radiology Studies: Mr Shoulder Left W Wo Contrast  Result Date: 05/05/2017 CLINICAL DATA:  Left shoulder pain, fever, chills EXAM: MRI OF THE LEFT SHOULDER WITHOUT AND WITH CONTRAST TECHNIQUE: Multiplanar, multisequence MR imaging of the LEFT shoulder was performed before and after the administration of intravenous contrast. CONTRAST:  13mL MULTIHANCE GADOBENATE DIMEGLUMINE 529 MG/ML IV SOLN COMPARISON:  None. FINDINGS: Rotator cuff: Supraspinatus tendon is intact. Mild tendinosis of the infraspinatus tendon. Teres minor tendon is intact. Subscapularis tendon is intact. Muscles: No atrophy or fatty replacement of nor abnormal signal  within, the muscles of the rotator cuff. Biceps long head:  Intact. Acromioclavicular Joint: Mild arthropathy of the acromioclavicular joint. Type I acromion. No subacromial/subdeltoid bursal fluid. Glenohumeral Joint: No joint effusion. No chondral defect. Labrum: Grossly intact, but evaluation is limited by lack of intraarticular fluid. Bones: Diffuse hypointense T1 marrow signal throughout the humerus and scapula as well is distal clavicle consistent with marrow infiltrative process given the patient's history of CLL.  Other: No fluid collection or hematoma. IMPRESSION: 1. Mild tendinosis of the infraspinatus tendon. 2. Diffuse abnormal marrow signal throughout the humerus and scapula as well is distal clavicle consistent with marrow infiltrative process given the patient's history of CLL. Electronically Signed   By: Kathreen Devoid   On: 05/05/2017 13:30    Scheduled Meds: . dicyclomine  10 mg Oral TID AC  . enoxaparin (LOVENOX) injection  40 mg Subcutaneous QHS  . lisinopril  20 mg Oral Daily  . loratadine  10 mg Oral Daily  . mouth rinse  15 mL Mouth Rinse BID  . multivitamin with minerals  1 tablet Oral Daily  . naproxen  500 mg Oral BID WC  . pantoprazole  40 mg Oral Daily  . senna-docusate  1 tablet Oral Daily  . vitamin C  125 mg Oral Daily  . vitamin E  400 Units Oral Daily   Continuous Infusions: . cefTRIAXone (ROCEPHIN)  IV Stopped (05/06/17 0939)     LOS: 3 days   Time spent: 15 minutes.  Vance Gather, MD [facility] Pager [phone]  If 7PM-7AM, please contact night-coverage [email or web address] Password [email or web address] 05/06/2017, 4:19 PM

## 2017-05-06 NOTE — Interval H&P Note (Signed)
History and Physical Interval Note:  05/06/2017 11:22 AM  Todd Novak  has presented today for surgery, with the diagnosis of bacteremia  The various methods of treatment have been discussed with the patient and family. After consideration of risks, benefits and other options for treatment, the patient has consented to  Procedure(s): TRANSESOPHAGEAL ECHOCARDIOGRAM (TEE) (N/A) as a surgical intervention .  The patient's history has been reviewed, patient examined, no change in status, stable for surgery.  I have reviewed the patient's chart and labs.  Questions were answered to the patient's satisfaction.     Fransico Him

## 2017-05-06 NOTE — Anesthesia Procedure Notes (Signed)
Procedure Name: MAC Date/Time: 05/06/2017 11:43 AM Performed by: Jenne Campus Pre-anesthesia Checklist: Patient identified, Emergency Drugs available, Suction available, Patient being monitored and Timeout performed Oxygen Delivery Method: Nasal cannula Placement Confirmation: positive ETCO2 and breath sounds checked- equal and bilateral Dental Injury: Teeth and Oropharynx as per pre-operative assessment

## 2017-05-06 NOTE — Anesthesia Postprocedure Evaluation (Signed)
Anesthesia Post Note  Patient: Todd Novak  Procedure(s) Performed: TRANSESOPHAGEAL ECHOCARDIOGRAM (TEE) (N/A )     Patient location during evaluation: PACU Anesthesia Type: MAC and General Level of consciousness: awake Pain management: pain level controlled Vital Signs Assessment: post-procedure vital signs reviewed and stable Respiratory status: spontaneous breathing Anesthetic complications: no    Last Vitals:  Vitals:   05/06/17 1343 05/06/17 1431  BP: 128/82 131/83  Pulse: 95 90  Resp: 18 18  Temp: 36.7 C 36.8 C  SpO2: 99% 99%    Last Pain:  Vitals:   05/06/17 1431  TempSrc: Oral  PainSc:                  Alaja Goldinger

## 2017-05-06 NOTE — Anesthesia Preprocedure Evaluation (Addendum)
Anesthesia Evaluation  Patient identified by MRN, date of birth, ID band Patient awake    Reviewed: Allergy & Precautions, NPO status , Patient's Chart, lab work & pertinent test results  Airway Mallampati: II  TM Distance: >3 FB Neck ROM: Full    Dental  (+) Teeth Intact   Pulmonary     + decreased breath sounds      Cardiovascular hypertension, + dysrhythmias Atrial Fibrillation  Rhythm:Regular Rate:Tachycardia     Neuro/Psych    GI/Hepatic Bowel prep,GERD  ,  Endo/Other    Renal/GU negative Renal ROS     Musculoskeletal   Abdominal   Peds  Hematology   Anesthesia Other Findings   Reproductive/Obstetrics                            Anesthesia Physical Anesthesia Plan  ASA: III  Anesthesia Plan: General   Post-op Pain Management:    Induction: Intravenous  PONV Risk Score and Plan: Ondansetron, Propofol infusion and Treatment may vary due to age or medical condition  Airway Management Planned: Natural Airway, Nasal Cannula and Mask  Additional Equipment:   Intra-op Plan:   Post-operative Plan:   Informed Consent: I have reviewed the patients History and Physical, chart, labs and discussed the procedure including the risks, benefits and alternatives for the proposed anesthesia with the patient or authorized representative who has indicated his/her understanding and acceptance.   Dental advisory given  Plan Discussed with: CRNA, Anesthesiologist and Surgeon  Anesthesia Plan Comments:       Anesthesia Quick Evaluation

## 2017-05-06 NOTE — CV Procedure (Addendum)
    PROCEDURE NOTE:  Procedure:  Transesophageal echocardiogram Operator:  Fransico Him, MD Indications:  bacteremia Complications: None  During this procedure the patient is administered a total of Propofol 648 mg to achieve and maintain moderate conscious sedation.  The patient's heart rate, blood pressure, and oxygen saturation are monitored continuously during the procedure by anesthesia.  Results: Normal LV size and function with moderate basal sepal hypertrophy with chordal SAM and marked increased turbulence in the LVOT.  The resting LVOT gradient is 53mmHg consistent with HOCM. Normal RV size and function Normal RA Mildly dilated LA with mild spontaneous echo contrast in the body of the LA.  Normal LAA.   Normal TV with mild TR Normal PV Normal MV with mild to moderate central MR Mildly thickened  trileaflet AV Normal interatrial septum with no evidence of shunt by colorflow dopper  Normal thoracic and ascending aorta.  The patient tolerated the procedure well and was transferred back to their room in stable condition.  Signed: Fransico Him, MD [facility]

## 2017-05-06 NOTE — Transfer of Care (Signed)
Immediate Anesthesia Transfer of Care Note  Patient: Todd Novak  Procedure(s) Performed: TRANSESOPHAGEAL ECHOCARDIOGRAM (TEE) (N/A )  Patient Location: Endoscopy Unit  Anesthesia Type:MAC  Level of Consciousness: oriented, sedated and patient cooperative  Airway & Oxygen Therapy: Patient Spontanous Breathing and Patient connected to nasal cannula oxygen  Post-op Assessment: Report given to RN and Post -op Vital signs reviewed and stable  Post vital signs: Reviewed  Last Vitals:  Vitals:   05/06/17 0638 05/06/17 1048  BP: 132/90 139/85  Pulse: 85 87  Resp: 18 19  Temp: 37.1 C 37 C  SpO2: 97% 97%    Last Pain:  Vitals:   05/06/17 1048  TempSrc: Oral  PainSc: 4       Patients Stated Pain Goal: 3 (60/73/71 0626)  Complications: No apparent anesthesia complications

## 2017-05-06 NOTE — Progress Notes (Signed)
[facility] Coordinator will follow Mr. Maulden with ID team to support home infusion pharmacy needs as ordered/needed at DC.  If patient discharges after hours, please call [phone].   Larry Sierras 05/06/2017, 9:50 AM

## 2017-05-06 NOTE — Progress Notes (Signed)
    [facility] has been requested to perform a transesophageal echocardiogram on Denton Ar for bacteremia.  After careful review of history and examination, the risks and benefits of transesophageal echocardiogram have been explained including risks of esophageal damage, perforation (1:10,000 risk), bleeding, pharyngeal hematoma as well as other potential complications associated with conscious sedation including aspiration, arrhythmia, respiratory failure and death. Alternatives to treatment were discussed, questions were answered. Patient is willing to proceed.   Lyda Jester, PA-C  05/06/2017 8:30 AM

## 2017-05-06 NOTE — Progress Notes (Signed)
[facility]   Reason for visit: Follow up on bacteremia  Interval History: GBS and sensitivities out; no fever, TEE without vegetation, MRI without septic arthritis.  Blood cultures repeated today.    Physical Exam: Constitutional:  Vitals:   05/06/17 1343 05/06/17 1431  BP: 128/82 131/83  Pulse: 95 90  Resp: 18 18  Temp: 98 F (36.7 C) 98.3 F (36.8 C)  SpO2: 99% 99%   patient appears in NAD Eyes: anicteric HENT: no thrush Respiratory: Normal respiratory effort; CTA B Cardiovascular: RRR  Review of Systems: Constitutional: negative for fevers, chills and malaise Gastrointestinal: negative for diarrhea Integument/breast: negative for rash  Lab Results  Component Value Date   WBC 17.0 (H) 05/05/2017   HGB 10.4 (L) 05/05/2017   HCT 30.7 (L) 05/05/2017   MCV 92.7 05/05/2017   PLT 110 (L) 05/05/2017    Lab Results  Component Value Date   CREATININE 0.91 05/05/2017   BUN 13 05/05/2017   NA 139 05/05/2017   K 3.9 05/05/2017   CL 106 05/05/2017   CO2 25 05/05/2017    Lab Results  Component Value Date   ALT 92 (H) 05/04/2017   AST 75 (H) 05/04/2017   ALKPHOS 103 05/04/2017     Microbiology: Recent Results (from the past 240 hour(s))  Culture, blood (Routine x 2)     Status: Abnormal   Collection Time: 05/03/17 10:30 AM  Result Value Ref Range Status   Specimen Description BLOOD RIGHT ARM  Final   Special Requests   Final    BOTTLES DRAWN AEROBIC AND ANAEROBIC Blood Culture adequate volume   Culture  Setup Time   Final    IN BOTH AEROBIC AND ANAEROBIC BOTTLES GRAM POSITIVE COCCI IN CHAINS CRITICAL RESULT CALLED TO, READ BACK BY AND VERIFIED WITH: BShirlee Latch [id number] 05/04/2017 T. TYSOR    Culture GROUP B STREP(S.AGALACTIAE)ISOLATED (A)  Final   Report Status 05/06/2017 FINAL  Final   Organism ID, Bacteria GROUP B STREP(S.AGALACTIAE)ISOLATED  Final      Susceptibility   Group b strep(s.agalactiae)isolated - MIC*   CLINDAMYCIN <=0.25 SENSITIVE Sensitive     AMPICILLIN <=0.25 SENSITIVE Sensitive     ERYTHROMYCIN <=0.12 SENSITIVE Sensitive     VANCOMYCIN 0.5 SENSITIVE Sensitive     CEFTRIAXONE <=0.12 SENSITIVE Sensitive     LEVOFLOXACIN 0.5 SENSITIVE Sensitive     * GROUP B STREP(S.AGALACTIAE)ISOLATED  Blood Culture ID Panel (Reflexed)     Status: Abnormal   Collection Time: 05/03/17 10:30 AM  Result Value Ref Range Status   Enterococcus species NOT DETECTED NOT DETECTED Final   Vancomycin resistance NOT DETECTED NOT DETECTED Final   Listeria monocytogenes NOT DETECTED NOT DETECTED Final   Staphylococcus species NOT DETECTED NOT DETECTED Final   Staphylococcus aureus NOT DETECTED NOT DETECTED Final   Methicillin resistance NOT DETECTED NOT DETECTED Final   Streptococcus species DETECTED (A) NOT DETECTED Final    Comment: CRITICAL RESULT CALLED TO, READ BACK BY AND VERIFIED WITH: B. GREEN,PHARMD 0233 05/04/2017 T. TYSOR    Streptococcus agalactiae DETECTED (A) NOT DETECTED Final    Comment: CRITICAL RESULT CALLED TO, READ BACK BY AND VERIFIED WITH: B. GREEN,PHARMD [id number] 05/04/2017 T. TYSOR    Streptococcus pneumoniae NOT DETECTED NOT DETECTED Final   Streptococcus pyogenes NOT DETECTED NOT DETECTED Final   Acinetobacter baumannii NOT DETECTED NOT DETECTED Final   Enterobacteriaceae species NOT DETECTED NOT DETECTED Final   Enterobacter cloacae complex NOT DETECTED NOT DETECTED Final  Escherichia coli NOT DETECTED NOT DETECTED Final   Klebsiella oxytoca NOT DETECTED NOT DETECTED Final   Klebsiella pneumoniae NOT DETECTED NOT DETECTED Final   Proteus species NOT DETECTED NOT DETECTED Final   Serratia marcescens NOT DETECTED NOT DETECTED Final   Carbapenem resistance NOT DETECTED NOT DETECTED Final   Haemophilus influenzae NOT DETECTED NOT DETECTED Final   Neisseria meningitidis NOT DETECTED NOT DETECTED Final   Pseudomonas aeruginosa NOT DETECTED NOT DETECTED Final   Candida albicans NOT DETECTED  NOT DETECTED Final   Candida glabrata NOT DETECTED NOT DETECTED Final   Candida krusei NOT DETECTED NOT DETECTED Final   Candida parapsilosis NOT DETECTED NOT DETECTED Final   Candida tropicalis NOT DETECTED NOT DETECTED Final  Culture, blood (Routine x 2)     Status: Abnormal   Collection Time: 05/03/17 12:32 PM  Result Value Ref Range Status   Specimen Description BLOOD RIGHT FOREARM  Final   Special Requests   Final    BOTTLES DRAWN AEROBIC AND ANAEROBIC Blood Culture adequate volume   Culture  Setup Time   Final    GRAM POSITIVE COCCI IN CHAINS IN BOTH AEROBIC AND ANAEROBIC BOTTLES CRITICAL RESULT CALLED TO, READ BACK BY AND VERIFIED WITH: M RENZ,PHARMD AT 0908 05/04/17 BY L BENFIELD    Culture (A)  Final    GROUP B STREP(S.AGALACTIAE)ISOLATED SUSCEPTIBILITIES PERFORMED ON PREVIOUS CULTURE WITHIN THE LAST 5 DAYS. Performed at [facility], [place] [address], [place], [place] [address]    Report Status 05/06/2017 FINAL  Final  Urine culture     Status: None   Collection Time: 05/03/17  2:45 PM  Result Value Ref Range Status   Specimen Description URINE, RANDOM  Final   Special Requests NONE  Final   Culture   Final    NO GROWTH Performed at [facility], [place] [address] [place], [place] [address]    Report Status 05/05/2017 FINAL  Final  MRSA PCR Screening     Status: None   Collection Time: 05/03/17  8:37 PM  Result Value Ref Range Status   MRSA by PCR NEGATIVE NEGATIVE Final    Comment:        The GeneXpert MRSA Assay (FDA approved for NASAL specimens only), is one component of a comprehensive MRSA colonization surveillance program. It is not intended to diagnose MRSA infection nor to guide or monitor treatment for MRSA infections.     Impression/Plan:  1. GBS bacteremia - no other source identified in shoulder or valves.  Mainly just from lungs.  Improved.   With bacteremia, IV therapy at discharge will be ideal for 7 days after discharge  Can use  ceftriaxone 2 grams daily as he is getting or ampicillin.  picc line ok on Friday if repeat blood cultures remain negative and d/c then from ID standpoint Home health following  2. Shoulder pain - no septic arthritis noted and he is moving it well now.

## 2017-05-06 NOTE — Progress Notes (Signed)
PT Cancellation Note  Patient Details Name: Todd Novak MRN: [id number] DOB: [birth year]   Cancelled Treatment:    Reason Eval/Treat Not Completed: Patient at procedure or test/unavailable   Gwendelyn Lanting,KATHrine E 05/06/2017, 10:46 AM Carmelia Bake, PT, DPT 05/06/2017 Pager: [phone]

## 2017-05-07 LAB — CBC
HCT: 32.4 % — ABNORMAL LOW (ref 39.0–52.0)
Hemoglobin: 10.8 g/dL — ABNORMAL LOW (ref 13.0–17.0)
MCH: 30.8 pg (ref 26.0–34.0)
MCHC: 33.3 g/dL (ref 30.0–36.0)
MCV: 92.3 fL (ref 78.0–100.0)
Platelets: 112 10*3/uL — ABNORMAL LOW (ref 150–400)
RBC: 3.51 MIL/uL — ABNORMAL LOW (ref 4.22–5.81)
RDW: 13.8 % (ref 11.5–15.5)
WBC: 12.2 10*3/uL — ABNORMAL HIGH (ref 4.0–10.5)

## 2017-05-07 NOTE — Progress Notes (Signed)
Pharmacy Antibiotic Note  Todd Novak is a 51 y.o. male admitted on 05/03/2017 with GBS bacteremia.  Pharmacy has been consulted for Ceftriaxone dosing.  Plan: Continue Ceftriaxone 2g IV q24h. No dose adjustments needed so pharmacy will sign off. Please consult if need assistance with discharge antibiotic orders --> Enter OPAT consult.  Height: 5\' 9"  (175.3 cm) Weight: 198 lb (89.8 kg) IBW/kg (Calculated) : 70.7  Temp (24hrs), Avg:98.1 F (36.7 C), Min:97.6 F (36.4 C), Max:98.4 F (36.9 C)   Recent Labs Lab 05/03/17 1030 05/03/17 1042 05/04/17 0334 05/05/17 0540 05/07/17 0444  WBC 11.7*  --  18.8* 17.0* 12.2*  CREATININE 0.94  --  0.94 0.91  --   LATICACIDVEN  --  1.18  --   --   --     Estimated Creatinine Clearance: 105.2 mL/min (by C-G formula based on SCr of 0.91 mg/dL).    Allergies  Allergen Reactions  . Methocarbamol Nausea Only    dizziness  . Tramadol Nausea And Vomiting    Antimicrobials this admission:  10/7 vanc>> 10/8 10/7 zosyn >> 10/8 10/7 azithro>> 10/8 10/7 CTX>>  Microbiology results:  10/7 BCx x2: 2/2 Step agalactiae 10/10 BCx:   Thank you for allowing pharmacy to be a part of this patient's care.  Hershal Coria 05/07/2017 11:03 AM

## 2017-05-07 NOTE — Evaluation (Signed)
Physical Therapy Evaluation Patient Details Name: Todd Novak MRN: [id number] DOB: June 16, 1966 Today's Date: 05/07/2017   History of Present Illness  51 yo male admitted with sepsis, L shoulder pain. MRI(+) L infraspinatus tendinosis. Hx of CLL  Clinical Impression  On eval, pt is Ind with mobility. Present problem is L shoulder pain. MRI showed L infraspinatus tendinosis. Pt reported some improvement in pain and ROM over last few days. Initiated ROM/isometric strength program-see exercise section below. Instructed pt to work within pain-free/minimal pain ranges and to stop/back off if symptoms worsen. Recommended icing throughout the day, especially after exercising. Recommended regimen of 2x/day beginning with 10 reps of each. Encouraged pt to speak with MD about use of continued anti-inflammatories. Also instructed pt to f/u with PCP for OP PT referral if symptoms persist/worsen over next 10-14 days. Pt stated he did not require a handout for today's exercises. Will follow during hospital stay.     Follow Up Recommendations Outpatient PT (OP PT if symptoms persist. Pt may benefit from a monitored progressive ROM/strength program)    Equipment Recommendations  None recommended by PT    Recommendations for Other Services       Precautions / Restrictions Precautions Precautions: None Restrictions Weight Bearing Restrictions: No      Mobility  Bed Mobility Overal bed mobility: Independent                Transfers Overall transfer level: Independent                  Ambulation/Gait Ambulation/Gait assistance: Independent              Stairs            Wheelchair Mobility    Modified Rankin (Stroke Patients Only)       Balance                                             Pertinent Vitals/Pain Pain Assessment: 0-10 Pain Score: 7  Pain Location: L shoulder.  3/10 at rest; 7/10 with activity/ROM exercises Pain Descriptors /  Indicators: Sharp;Sore;Aching;Grimacing Pain Intervention(s): Limited activity within patient's tolerance;Repositioned;Ice applied    Home Living Family/patient expects to be discharged to:: Private residence Living Arrangements: Spouse/significant other             Home Equipment: None      Prior Function Level of Independence: Independent               Hand Dominance   Dominant Hand: Left    Extremity/Trunk Assessment   Upper Extremity Assessment Upper Extremity Assessment: LUE deficits/detail LUE Deficits / Details: AAROM to ~95 degrees before pain. Shoulder strength at least 3/5. Compensation noted with any movement above 80 degrees    Lower Extremity Assessment Lower Extremity Assessment: Overall WFL for tasks assessed    Cervical / Trunk Assessment Cervical / Trunk Assessment: Normal  Communication   Communication: No difficulties  Cognition Arousal/Alertness: Awake/alert Behavior During Therapy: WFL for tasks assessed/performed Overall Cognitive Status: Within Functional Limits for tasks assessed                                        General Comments      Exercises Other Exercises Other Exercises: Initiated L shoulder ROM/isometric exercises.  Standing "wall walks with fingers" flexion and abduction to 90 degrees. Demonstrated supine AA rod/cane ROM: flexion, abduction, scaption. Standing isometrics with elbow at 90 degrees near trunk: flexion, abduction, extension.    Assessment/Plan    PT Assessment All further PT needs can be met in the next venue of care (OP PT if symptoms persist. Pt may need a monitored progressive ROM/strengthening program)  PT Problem List Decreased mobility;Decreased range of motion;Pain;Decreased activity tolerance       PT Treatment Interventions      PT Goals (Current goals can be found in the Care Plan section)  Acute Rehab PT Goals Patient Stated Goal: less pain. regain PLOF.  PT Goal  Formulation: All assessment and education complete, DC therapy Time For Goal Achievement: 05/21/17 Potential to Achieve Goals: Good    Frequency     Barriers to discharge        Co-evaluation               AM-PAC PT "6 Clicks" Daily Activity  Outcome Measure Difficulty turning over in bed (including adjusting bedclothes, sheets and blankets)?: None Difficulty moving from lying on back to sitting on the side of the bed? : None Difficulty sitting down on and standing up from a chair with arms (e.g., wheelchair, bedside commode, etc,.)?: None Help needed moving to and from a bed to chair (including a wheelchair)?: None Help needed walking in hospital room?: None Help needed climbing 3-5 steps with a railing? : None 6 Click Score: 24    End of Session   Activity Tolerance: Patient tolerated treatment well;Patient limited by pain Patient left: in bed;with call bell/phone within reach   PT Visit Diagnosis: Pain Pain - Right/Left: Left Pain - part of body: Shoulder    Time: 4235-3614 PT Time Calculation (min) (ACUTE ONLY): 29 min   Charges:   PT Evaluation $PT Eval Low Complexity: 1 Low PT Treatments $Therapeutic Exercise: 8-22 mins   PT G Codes:          Weston Anna, MPT Pager: [phone]

## 2017-05-07 NOTE — Progress Notes (Signed)
PROGRESS NOTE  Todd Novak  EHU:[id number] DOB: 31-Mar-1966 DOA: 05/03/2017 PCP: Debbrah Alar, NP  Brief Narrative: Todd Novak is a 51 y.o. left handed male with a history of CLL who presented with left shoulder pain, fever, chills and cough. He had been having severe chills and fever with persistent cough starting 2 weeks prior while on a business trip in [place] which seemed to improve some, though he's continued to have a nonproductive cough. He also noted gradual onset of severe left shoulder pain recently with no known preceding trauma, though he had lifted a larger freezer a few days prior. He presented to his PCP for this and had negative XR, received steroid injection, muscle relaxers without improvement in symptoms. He also has diffuse joint and body pains, and presented due to this and increasingly restricted ROM with recurrent fevers up to nearly 104F. On arrival he was febrile to 103.17F with WBC 11.7, CXR read as bibasilar pneumonia. Flu negative. D-dimer elevated with no evidence of PE, dissection, or infiltrate on CTA chest. Broad spectrum antibiotics were started and he was transferred to [facility] for ongoing care. MRI showed rotator cuff tear without septic joint. Blood cultures positive for S. agalactiae. Echo then TEE showed no vegetations. Repeat blood cultures have been drawn on 10/10. If they remain negative at 48 hours, will insert PIC line and complete 7 more days of IV antibiotics at home.    Assessment & Plan: Principal Problem:   Sepsis due to Streptococcus agalactiae (HCC) Active Problems:   HTN (hypertension)   GERD (gastroesophageal reflux disease)   CLL (chronic lymphocytic leukemia) (HCC)   Acute pain of left shoulder  Sepsis due to GBS bacteremia in immunocompromised host: Pan-sensitive cultures. TEE without vegetations. No further fevers, subjectively improved.  - Ceftriaxone IV per ID - Follow up repeat blood cultures from 10/10 and insert PICC if  negative at 48 hours, then DC to home for 7 more days of IV antibiotics   Left infraspinatus tendinosis: MRI without joint effusion or osteomyelitis.   - PT consulted, may benefit from outpatient PT.  - Will Tx with tylenol, tramadol, toradol.   CLL: Currently on active surveillance at [facility] - Monitor  HTN:  - Continue lisinopril  Dyspepsia: Chronic, stable.  - Continue PPI  Thrombocytopenia: Mild, chronic due to ITP Dx 2013.  - Monitor platelet counts.   DVT prophylaxis: Lovenox Code Status: Full Family Communication: Wife at bedside Disposition Plan: Insert PICC if blood cultures negative at 48 hours (10/12), then DC to home for 7 days of IV antibiotics per ID  Consultants:   ID, Dr. Linus Salmons  Procedures:   None  Antimicrobials:  Vanc/zosyn x1 10/7  Azithromycin 10/7   Ceftriaxone 10/7 >>   Subjective: No fevers. Cough improved. No dyspnea. Left shoulder better, worked with PT.   Objective: BP 95/63 (BP Location: Left Arm)   Pulse 87   Temp 98.1 F (36.7 C) (Oral)   Resp 15   Ht 5\' 9"  (1.753 m)   Wt 89.8 kg (198 lb)   SpO2 97%   BMI 29.24 kg/m   Gen: 51 y.o. male in no distress Pulm: Non-labored breathing room air. Clear breath sounds. CV: Regular rate and rhythm. II/VI systolic murmur, rub, or gallop. No JVD, no LE edema. Ext: Left shoulder with better AROM, abducting to 21F improved diffuse tenderness. No warmth or erythema. Skin: No rashes, lesions no ulcers Neuro: Alert and oriented. No focal neurological deficits. Psych: Judgement and insight appear normal. Mood &  affect appropriate.   CBC:  Recent Labs Lab 05/03/17 1030 05/04/17 0334 05/05/17 0540 05/07/17 0444  WBC 11.7* 18.8* 17.0* 12.2*  NEUTROABS 3.2  --   --   --   HGB 13.1 11.4* 10.4* 10.8*  HCT 38.1* 33.9* 30.7* 32.4*  MCV 91.1 92.6 92.7 92.3  PLT 127* 110* 110* 235*   Basic Metabolic Panel:  Recent Labs Lab 05/03/17 1030 05/04/17 0334 05/05/17 0540  NA 130* 135 139  K 3.9  4.0 3.9  CL 98* 103 106  CO2 23 23 25   GLUCOSE 154* 131* 117*  BUN 15 13 13   CREATININE 0.94 0.94 0.91  CALCIUM 9.0 8.3* 8.5*   Liver Function Tests:  Recent Labs Lab 05/03/17 1030 05/04/17 0334  AST 43* 75*  ALT 54 92*  ALKPHOS 108 103  BILITOT 0.7 0.5  PROT 7.2 5.9*  ALBUMIN 3.5 2.9*   Urine analysis:    Component Value Date/Time   COLORURINE YELLOW 05/03/2017 1445   APPEARANCEUR CLEAR 05/03/2017 1445   LABSPEC 1.020 05/03/2017 1445   PHURINE 6.0 05/03/2017 1445   GLUCOSEU NEGATIVE 05/03/2017 1445   HGBUR TRACE (A) 05/03/2017 1445   BILIRUBINUR NEGATIVE 05/03/2017 1445   KETONESUR NEGATIVE 05/03/2017 1445   PROTEINUR 30 (A) 05/03/2017 1445   UROBILINOGEN 0.2 11/24/2013 1417   NITRITE NEGATIVE 05/03/2017 1445   LEUKOCYTESUR NEGATIVE 05/03/2017 1445   Recent Results (from the past 240 hour(s))  Culture, blood (Routine x 2)     Status: Abnormal   Collection Time: 05/03/17 10:30 AM  Result Value Ref Range Status   Specimen Description BLOOD RIGHT ARM  Final   Special Requests   Final    BOTTLES DRAWN AEROBIC AND ANAEROBIC Blood Culture adequate volume   Culture  Setup Time   Final    IN BOTH AEROBIC AND ANAEROBIC BOTTLES GRAM POSITIVE COCCI IN CHAINS CRITICAL RESULT CALLED TO, READ BACK BY AND VERIFIED WITH: B. GREEN,PHARMD 0233 05/04/2017 T. TYSOR    Culture GROUP B STREP(S.AGALACTIAE)ISOLATED (A)  Final   Report Status 05/06/2017 FINAL  Final   Organism ID, Bacteria GROUP B STREP(S.AGALACTIAE)ISOLATED  Final      Susceptibility   Group b strep(s.agalactiae)isolated - MIC*    CLINDAMYCIN <=0.25 SENSITIVE Sensitive     AMPICILLIN <=0.25 SENSITIVE Sensitive     ERYTHROMYCIN <=0.12 SENSITIVE Sensitive     VANCOMYCIN 0.5 SENSITIVE Sensitive     CEFTRIAXONE <=0.12 SENSITIVE Sensitive     LEVOFLOXACIN 0.5 SENSITIVE Sensitive     * GROUP B STREP(S.AGALACTIAE)ISOLATED  Blood Culture ID Panel (Reflexed)     Status: Abnormal   Collection Time: 05/03/17 10:30 AM    Result Value Ref Range Status   Enterococcus species NOT DETECTED NOT DETECTED Final   Vancomycin resistance NOT DETECTED NOT DETECTED Final   Listeria monocytogenes NOT DETECTED NOT DETECTED Final   Staphylococcus species NOT DETECTED NOT DETECTED Final   Staphylococcus aureus NOT DETECTED NOT DETECTED Final   Methicillin resistance NOT DETECTED NOT DETECTED Final   Streptococcus species DETECTED (A) NOT DETECTED Final    Comment: CRITICAL RESULT CALLED TO, READ BACK BY AND VERIFIED WITH: B. GREEN,PHARMD 0233 05/04/2017 T. TYSOR    Streptococcus agalactiae DETECTED (A) NOT DETECTED Final    Comment: CRITICAL RESULT CALLED TO, READ BACK BY AND VERIFIED WITH: B. GREEN,PHARMD 0233 05/04/2017 T. TYSOR    Streptococcus pneumoniae NOT DETECTED NOT DETECTED Final   Streptococcus pyogenes NOT DETECTED NOT DETECTED Final   Acinetobacter baumannii NOT DETECTED NOT DETECTED  Final   Enterobacteriaceae species NOT DETECTED NOT DETECTED Final   Enterobacter cloacae complex NOT DETECTED NOT DETECTED Final   Escherichia coli NOT DETECTED NOT DETECTED Final   Klebsiella oxytoca NOT DETECTED NOT DETECTED Final   Klebsiella pneumoniae NOT DETECTED NOT DETECTED Final   Proteus species NOT DETECTED NOT DETECTED Final   Serratia marcescens NOT DETECTED NOT DETECTED Final   Carbapenem resistance NOT DETECTED NOT DETECTED Final   Haemophilus influenzae NOT DETECTED NOT DETECTED Final   Neisseria meningitidis NOT DETECTED NOT DETECTED Final   Pseudomonas aeruginosa NOT DETECTED NOT DETECTED Final   Candida albicans NOT DETECTED NOT DETECTED Final   Candida glabrata NOT DETECTED NOT DETECTED Final   Candida krusei NOT DETECTED NOT DETECTED Final   Candida parapsilosis NOT DETECTED NOT DETECTED Final   Candida tropicalis NOT DETECTED NOT DETECTED Final  Culture, blood (Routine x 2)     Status: Abnormal   Collection Time: 05/03/17 12:32 PM  Result Value Ref Range Status   Specimen Description BLOOD RIGHT  FOREARM  Final   Special Requests   Final    BOTTLES DRAWN AEROBIC AND ANAEROBIC Blood Culture adequate volume   Culture  Setup Time   Final    GRAM POSITIVE COCCI IN CHAINS IN BOTH AEROBIC AND ANAEROBIC BOTTLES CRITICAL RESULT CALLED TO, READ BACK BY AND VERIFIED WITH: M RENZ,PHARMD AT 0908 05/04/17 BY L BENFIELD    Culture (A)  Final    GROUP B STREP(S.AGALACTIAE)ISOLATED SUSCEPTIBILITIES PERFORMED ON PREVIOUS CULTURE WITHIN THE LAST 5 DAYS. Performed at [facility], [place] [address], [place], [place] [address]    Report Status 05/06/2017 FINAL  Final  Urine culture     Status: None   Collection Time: 05/03/17  2:45 PM  Result Value Ref Range Status   Specimen Description URINE, RANDOM  Final   Special Requests NONE  Final   Culture   Final    NO GROWTH Performed at [facility], [place] [address] [place], [place] [address]    Report Status 05/05/2017 FINAL  Final  MRSA PCR Screening     Status: None   Collection Time: 05/03/17  8:37 PM  Result Value Ref Range Status   MRSA by PCR NEGATIVE NEGATIVE Final    Comment:        The GeneXpert MRSA Assay (FDA approved for NASAL specimens only), is one component of a comprehensive MRSA colonization surveillance program. It is not intended to diagnose MRSA infection nor to guide or monitor treatment for MRSA infections.   Culture, blood (routine x 2)     Status: None (Preliminary result)   Collection Time: 05/06/17  5:31 AM  Result Value Ref Range Status   Specimen Description BLOOD RIGHT HAND  Final   Special Requests   Final    BOTTLES DRAWN AEROBIC ONLY Blood Culture adequate volume   Culture   Final    NO GROWTH 1 DAY Performed at [facility], [place] [address], [place], [place] [address]    Report Status PENDING  Incomplete  Culture, blood (routine x 2)     Status: None (Preliminary result)   Collection Time: 05/06/17  5:31 AM  Result Value Ref Range Status   Specimen Description BLOOD RIGHT ANTECUBITAL   Final   Special Requests   Final    BOTTLES DRAWN AEROBIC ONLY Blood Culture adequate volume   Culture   Final    NO GROWTH 1 DAY Performed at [facility], [place] [address], [place], [place] [address]    Report Status PENDING  Incomplete      Radiology Studies: No results found.  Scheduled Meds: . dicyclomine  10 mg Oral TID AC  . enoxaparin (LOVENOX) injection  40 mg Subcutaneous QHS  . lisinopril  20 mg Oral Daily  . loratadine  10 mg Oral Daily  . mouth rinse  15 mL Mouth Rinse BID  . multivitamin with minerals  1 tablet Oral Daily  . naproxen  500 mg Oral BID WC  . pantoprazole  40 mg Oral Daily  . senna-docusate  1 tablet Oral Daily  . vitamin C  125 mg Oral Daily  . vitamin E  400 Units Oral Daily   Continuous Infusions: . cefTRIAXone (ROCEPHIN)  IV Stopped (05/07/17 0952)     LOS: 4 days   Time spent: 15 minutes.  Vance Gather, MD [facility] Pager [phone]  If 7PM-7AM, please contact night-coverage [email or web address] Password [email or web address] 05/07/2017, 3:24 PM

## 2017-05-07 NOTE — Care Management Note (Signed)
Case Management Note  Patient Details  Name: Todd Novak MRN: [id number] Date of Birth: December 31, 1965  Subjective/Objective: [facility] iv infusion rep Pam already following for iv infusion. Provided patient w/HHC agnecy list-chose [facility] for HHRN-instruction of iv abx. Await if long term iv abx needed for home & PICC.                   Action/Plan:d/c plan home w/HHC/iv abx.   Expected Discharge Date:                  Expected Discharge Plan:  [place]  In-House Referral:     Discharge planning Services  Mobile Meals, CM Consult  Post Acute Care Choice:    Choice offered to:  Patient  DME Arranged:    DME Agency:     HH Arranged:  RN, IV Antibiotics HH Agency:  [place]  Status of Service:  In process, will continue to follow  If discussed at Long Length of Stay Meetings, dates discussed:    Additional Comments:  Dessa Phi, RN 05/07/2017, 12:28 PM

## 2017-05-08 LAB — CBC
HCT: 36.2 % — ABNORMAL LOW (ref 39.0–52.0)
Hemoglobin: 12.2 g/dL — ABNORMAL LOW (ref 13.0–17.0)
MCH: 31.3 pg (ref 26.0–34.0)
MCHC: 33.7 g/dL (ref 30.0–36.0)
MCV: 92.8 fL (ref 78.0–100.0)
Platelets: 138 10*3/uL — ABNORMAL LOW (ref 150–400)
RBC: 3.9 MIL/uL — ABNORMAL LOW (ref 4.22–5.81)
RDW: 14 % (ref 11.5–15.5)
WBC: 14.6 10*3/uL — ABNORMAL HIGH (ref 4.0–10.5)

## 2017-05-08 LAB — BASIC METABOLIC PANEL
Anion gap: 10 (ref 5–15)
BUN: 16 mg/dL (ref 6–20)
CO2: 27 mmol/L (ref 22–32)
Calcium: 9.6 mg/dL (ref 8.9–10.3)
Chloride: 104 mmol/L (ref 101–111)
Creatinine, Ser: 0.93 mg/dL (ref 0.61–1.24)
GFR calc Af Amer: >60 mL/min (ref >60)
GFR calc non Af Amer: >60 mL/min (ref >60)
Glucose, Bld: 149 mg/dL — ABNORMAL HIGH (ref 65–99)
Potassium: 4.9 mmol/L (ref 3.5–5.1)
Sodium: 141 mmol/L (ref 135–145)

## 2017-05-08 MED ORDER — IPRATROPIUM-ALBUTEROL 0.5-2.5 (3) MG/3ML IN SOLN: 3.0000 mL | Freq: Four times a day (QID) | RESPIRATORY_TRACT | 1 refills | Status: DC | PRN

## 2017-05-08 MED ORDER — IBUPROFEN 400 MG PO TABS: 400.0000 mg | ORAL_TABLET | ORAL | 0 refills | Status: DC | PRN

## 2017-05-08 MED ORDER — GUAIFENESIN 100 MG/5ML PO SOLN: 5.0000 mL | ORAL | 0 refills | Status: DC | PRN

## 2017-05-08 MED ORDER — SODIUM CHLORIDE 0.9% FLUSH: 10.0000 mL | INTRAVENOUS | Status: DC | PRN

## 2017-05-08 MED ORDER — CEFTRIAXONE IV (FOR PTA / DISCHARGE USE ONLY): 2.0000 g | INTRAVENOUS | 0 refills | Status: AC

## 2017-05-08 NOTE — Progress Notes (Signed)
[facility]  [facility] is prepared for pf DC home on IV Rocephin once PICC has been placed on Friday and IV ABX script is ready.  [facility] is planned for [facility] visit on Saturday for first home dose.  [facility] hospital infusion coordinator has provided hands on teaching with the pt regarding IV ABX administration and pt did very well.   If patient discharges after hours, please call [phone].   Larry Sierras 05/08/2017, 6:55 AM

## 2017-05-08 NOTE — Progress Notes (Signed)
Peripherally Inserted Central Catheter/Midline Placement  The IV Nurse has discussed with the patient and/or persons authorized to consent for the patient, the purpose of this procedure and the potential benefits and risks involved with this procedure.  The benefits include less needle sticks, lab draws from the catheter, and the patient may be discharged home with the catheter. Risks include, but not limited to, infection, bleeding, blood clot (thrombus formation), and puncture of an artery; nerve damage and irregular heartbeat and possibility to perform a PICC exchange if needed/ordered by physician.  Alternatives to this procedure were also discussed.  B[employer] Power PICC patient education guide, fact sheet on infection prevention and patient information card has been provided to patient /or left at bedside.    PICC/Midline Placement Documentation        Enos Fling 05/08/2017, 5:24 PM

## 2017-05-08 NOTE — Progress Notes (Signed)
PHARMACY CONSULT NOTE FOR:  OUTPATIENT  PARENTERAL ANTIBIOTIC THERAPY (OPAT)  Indication: Strep agalactiae bacteremia Regimen: Ceftriaxone 2g IV q24h End date: 05/16/17  IV antibiotic discharge orders are pended. To discharging provider:  please sign these orders via discharge navigator,  Select New Orders & click on the button choice - Manage This Unsigned Work.     Thank you for allowing pharmacy to be a part of this patient's care.  Todd Novak 05/08/2017, 11:50 AM

## 2017-05-08 NOTE — Discharge Summary (Addendum)
Physician Discharge Summary  Todd Novak KZS:[id number] DOB: May 02, 1966 DOA: 05/03/2017  PCP: Debbrah Alar, NP  Admit date: 05/03/2017 Discharge date: 05/08/2017  Recommendations for Outpatient Follow-up:  1. Pt will need to follow up with PCP in 1-2 weeks post discharge 2. Please obtain BMP to evaluate electrolytes and kidney function 3. Please also check CBC to evaluate Hg and Hct levels 4. Pt to continue IV Rocephin for one more week post discharge, can remove PICC line after completion of ABX therapy   Discharge Diagnoses:  Principal Problem:   Sepsis due to Streptococcus agalactiae ([place]) Active Problems:   HTN (hypertension)   GERD (gastroesophageal reflux disease)   CLL (chronic lymphocytic leukemia) (HCC)   Acute pain of left shoulder  Discharge Condition: Stable  Diet recommendation: Heart healthy diet discussed in details   History of present illness:  51 y.o. left handed male with a history of CLL who presented with left shoulder pain, fever, chills and cough. He had been having severe chills and fever with persistent cough starting 2 weeks prior while on a business trip in [place] which seemed to improve some, though he's continued to have a nonproductive cough. He also noted gradual onset of severe left shoulder pain recently with no known preceding trauma, though he had lifted a larger freezer a few days prior. He presented to his PCP for this and had negative XR, received steroid injection, muscle relaxers without improvement in symptoms. He also has diffuse joint and body pains, and presented due to this and increasingly restricted ROM with recurrent fevers up to nearly 104F.   Assessment & Plan: Principal Problem: Sepsis due to GBS bacteremia in immunocompromised host: Pan-sensitive cultures. TEE without vegetations. No further fevers, subjectively improved.  - Ceftriaxone IV per ID, cont Rocephin IV until May 16, 2017 - repeat blood cultures with no  growth   PNA, bibasilar PNA R> L - rule in and treated  Left infraspinatus tendinosis: MRI without joint effusion or osteomyelitis.   - analgesia as needed   CLL: Currently on active surveillance at [facility] - outpatient follow up  HTN:  - Continue lisinopril  Dyspepsia: Chronic, stable.  - Continue PPI  Thrombocytopenia: Mild, chronic due to ITP Dx 2013.  - needs outpatient monitoring   DVT prophylaxis: Lovenox Code Status: Full Family Communication: pt at bedside  Disposition Plan: home   Consultants:   ID, Dr. Linus Salmons  Procedures:   None  Antimicrobials:  Vanc/zosyn x1 10/7  Azithromycin 10/7   Ceftriaxone 10/7 >>   Procedures/Studies: Dg Chest 2 View  Result Date: 05/03/2017 CLINICAL DATA:  Fever and cough over the last 4 days. EXAM: CHEST  2 VIEW COMPARISON:  01/15/2017 FINDINGS: heart size is normal. Mediastinal shadows are normal. There is newly seen patchy infiltrate and volume loss in both lung bases right more than left consistent with pneumonia. No dense consolidation or lobar collapse. No effusions. IMPRESSION: Newly seen infiltrate and atelectasis at the lung bases right more than left consistent with pneumonia. Electronically Signed   By: Nelson Chimes M.D.   On: 05/03/2017 11:09   Ct Angio Chest Pe W And/or Wo Contrast  Result Date: 05/03/2017 CLINICAL DATA:  Left shoulder pain. Unable to move the left arm. Cough, congestion, fever for 4 days EXAM: CT ANGIOGRAPHY CHEST WITH CONTRAST TECHNIQUE: Multidetector CT imaging of the chest was performed using the standard protocol during bolus administration of intravenous contrast. Multiplanar CT image reconstructions and MIPs were obtained to evaluate the vascular anatomy.  CONTRAST:  100 mL Isovue 370 COMPARISON:  None. FINDINGS: Cardiovascular: Satisfactory opacification of the pulmonary arteries to the segmental level. No evidence of pulmonary embolism. Mild cardiomegaly. No pericardial effusion. Normal  caliber thoracic aorta. No thoracic aortic dissection. Mediastinum/Nodes: Mild bilateral axillary lymphadenopathy. Largest right axillary lymph node measures 10 mm in short axis. Largest left axillary lymph node measures 11 mm in short axis. Mildly enlarged right lower paratracheal lymph node measuring 12 mm in short axis. Thyroid gland, trachea, and esophagus demonstrate no significant findings. Lungs/Pleura: Bibasilar atelectasis. No focal consolidation, pleural effusion or pneumothorax. Upper Abdomen: Splenomegaly.  No acute upper abdominal abnormality. Musculoskeletal: No acute osseous abnormality. No lytic or sclerotic osseous lesion. Review of the MIP images confirms the above findings. IMPRESSION: 1. No evidence pulmonary embolus. 2. No thoracic aortic dissection. 3. Mild axillary lymphadenopathy. Splenomegaly. These findings are in keeping with the patient's history of CLL. Electronically Signed   By: Kathreen Devoid   On: 05/03/2017 15:02   Ct Shoulder Left Wo Contrast  Result Date: 05/03/2017 CLINICAL DATA:  Left shoulder pain. EXAM: CT OF THE UPPER LEFT EXTREMITY WITHOUT CONTRAST TECHNIQUE: Multidetector CT imaging of the upper left extremity was performed according to the standard protocol. COMPARISON:  Left shoulder x-rays dated April 30, 2017. FINDINGS: Bones/Joint/Cartilage No acute osseous abnormality. The acromioclavicular and glenohumeral joint spaces are preserved. No glenohumeral joint effusion. Ligaments Suboptimally assessed by CT. Muscles and Tendons Normal muscle bulk.  The rotator cuff is grossly intact. Soft tissues Multiple prominent and enlarged left axillary and subpectoral lymph nodes measuring up to 1.3 cm in short axis. Multiple prominent lower cervical lymph nodes, incompletely evaluated. Soft tissues are otherwise unremarkable. The visualized left lung is clear. IMPRESSION: 1. No acute osseous abnormality or significant degenerative changes. 2. Left axillary and subpectoral  lymphadenopathy, likely related to patient's history of chronic lymphocytic leukemia. Electronically Signed   By: Titus Dubin M.D.   On: 05/03/2017 14:56   Mr Shoulder Left W Wo Contrast  Result Date: 05/05/2017 CLINICAL DATA:  Left shoulder pain, fever, chills EXAM: MRI OF THE LEFT SHOULDER WITHOUT AND WITH CONTRAST TECHNIQUE: Multiplanar, multisequence MR imaging of the LEFT shoulder was performed before and after the administration of intravenous contrast. CONTRAST:  15m MULTIHANCE GADOBENATE DIMEGLUMINE 529 MG/ML IV SOLN COMPARISON:  None. FINDINGS: Rotator cuff: Supraspinatus tendon is intact. Mild tendinosis of the infraspinatus tendon. Teres minor tendon is intact. Subscapularis tendon is intact. Muscles: No atrophy or fatty replacement of nor abnormal signal within, the muscles of the rotator cuff. Biceps long head:  Intact. Acromioclavicular Joint: Mild arthropathy of the acromioclavicular joint. Type I acromion. No subacromial/subdeltoid bursal fluid. Glenohumeral Joint: No joint effusion. No chondral defect. Labrum: Grossly intact, but evaluation is limited by lack of intraarticular fluid. Bones: Diffuse hypointense T1 marrow signal throughout the humerus and scapula as well is distal clavicle consistent with marrow infiltrative process given the patient's history of CLL. Other: No fluid collection or hematoma. IMPRESSION: 1. Mild tendinosis of the infraspinatus tendon. 2. Diffuse abnormal marrow signal throughout the humerus and scapula as well is distal clavicle consistent with marrow infiltrative process given the patient's history of CLL. Electronically Signed   By: HKathreen Devoid  On: 05/05/2017 13:30   Dg Shoulder Left  Result Date: 05/01/2017 CLINICAL DATA:  Left shoulder pain without injury. EXAM: LEFT SHOULDER - 2+ VIEW COMPARISON:  None. FINDINGS: No acute fracture. No dislocation.  Unremarkable soft tissues. IMPRESSION: No acute bony pathology Electronically Signed  By: Marybelle Killings M.D.   On: 05/01/2017 12:55     Discharge Exam: Vitals:   05/08/17 0523 05/08/17 0937  BP: 106/83 (!) 130/97  Pulse: 83 89  Resp: 16   Temp: 98.7 F (37.1 C) 98.3 F (36.8 C)  SpO2: 97% 100%   Vitals:   05/07/17 0436 05/07/17 2057 05/08/17 0523 05/08/17 0937  BP: 95/63 117/78 106/83 (!) 130/97  Pulse: 87 93 83 89  Resp: '15 16 16   '$ Temp: 98.1 F (36.7 C) 98.4 F (36.9 C) 98.7 F (37.1 C) 98.3 F (36.8 C)  TempSrc: Oral Oral Oral Oral  SpO2: 97% 98% 97% 100%  Weight:      Height:        General: Pt is alert, follows commands appropriately, not in acute distress Cardiovascular: Regular rate and rhythm, S1/S2 +, no murmurs, no rubs, no gallops Respiratory: Clear to auscultation bilaterally, no wheezing, no crackles, no rhonchi Abdominal: Soft, non tender, non distended, bowel sounds +, no guarding Extremities: no edema, no cyanosis, pulses palpable bilaterally DP and PT Neuro: Grossly nonfocal  Discharge Instructions   Allergies as of 05/08/2017      Reactions   Methocarbamol Nausea Only   dizziness   Tramadol Nausea And Vomiting      Medication List    STOP taking these medications   valACYclovir 1000 MG tablet Commonly known as:  VALTREX     TAKE these medications   acetaminophen 325 MG tablet Commonly known as:  TYLENOL Take 650 mg by mouth every 4 (four) hours as needed for mild pain, moderate pain or headache.   albuterol 108 (90 Base) MCG/ACT inhaler Commonly known as:  PROVENTIL HFA;VENTOLIN HFA Inhale 2 puffs into the lungs every 6 (six) hours as needed for wheezing or shortness of breath.   cefTRIAXone IVPB Commonly known as:  ROCEPHIN Inject 2 g into the vein daily. Indication: Strep agalactiae bacteremia Last Day of Therapy:  05/16/17 Labs - Once weekly:  CBC/D and BMP, Labs - Every other week:  ESR and CRP   cyclobenzaprine 10 MG tablet Commonly known as:  FLEXERIL Take 1 tablet (10 mg total) by mouth 3 (three) times daily as needed  for muscle spasms.   dicyclomine 10 MG capsule Commonly known as:  BENTYL Take 1 tablet by mouth twice daily for abdominal pain, spasms.   fexofenadine 180 MG tablet Commonly known as:  ALLEGRA Take 1 tablet (180 mg total) by mouth daily.   guaiFENesin 100 MG/5ML Soln Commonly known as:  ROBITUSSIN Take 5 mLs (100 mg total) by mouth every 4 (four) hours as needed for cough or to loosen phlegm.   ibuprofen 400 MG tablet Commonly known as:  ADVIL,MOTRIN Take 1 tablet (400 mg total) by mouth every 4 (four) hours as needed for fever or moderate pain.   ipratropium-albuterol 0.5-2.5 (3) MG/3ML Soln Commonly known as:  DUONEB Take 3 mLs by nebulization every 6 (six) hours as needed.   lisinopril 20 MG tablet Commonly known as:  PRINIVIL,ZESTRIL TAKE 1 TABLET(20 MG) BY MOUTH DAILY   LYSINE PO Take 1 tablet by mouth daily.   multivitamin tablet Take 1 tablet by mouth daily.   naproxen 500 MG tablet Commonly known as:  NAPROSYN TAKE 1 TABLET(500 MG) BY MOUTH TWICE DAILY WITH A MEAL   omeprazole 40 MG capsule Commonly known as:  PRILOSEC Take 1 tablet by mouth twice daily.   vitamin C 100 MG tablet Take 100 mg by mouth daily.  vitamin E 400 UNIT capsule Take 400 Units by mouth daily.            Home Infusion Instuctions        Start     Ordered   05/08/17 0000  Home infusion instructions [facility] May follow [place] Dosing Protocol; May administer Cathflo as needed to maintain patency of vascular access device.; Flushing of vascular access device: per [facility] Protocol: 0.9% NaCl pre/post medica...    Question Answer Comment  Instructions May follow [place] Dosing Protocol   Instructions May administer Cathflo as needed to maintain patency of vascular access device.   Instructions Flushing of vascular access device: per [facility] Protocol: 0.9% NaCl pre/post medication administration and prn patency; Heparin 100 u/ml, 24m for implanted ports and Heparin  10u/ml, 555mfor all other central venous catheters.   Instructions May follow [facility] Anaphylaxis Protocol for First Dose Administration in the home: 0.9% NaCl at 25-50 ml/hr to maintain IV access for protocol meds. Epinephrine 0.3 ml IV/IM PRN and Benadryl 25-50 IV/IM PRN s/s of anaphylaxis.   Instructions [facility] Infusion Coordinator (RN) to assist per patient IV care needs in the home PRN.      05/08/17 [place].   Why:  IV abx,supplies Contact information: [address] [place] [phone]      Health, [facility]-Home Follow up.   Why:  HH nursing-instruction on iv abx. Contact information: [phone]        O'Debbrah AlarNP Follow up.   Specialty:  Internal Medicine Contact information: [address] [phone]        MyTheodis BlazeMD Follow up.   Specialty:  Internal Medicine Why:  call my cell with any questions [phone] information: [address] [phone]          The results of significant diagnostics from this hospitalization (including imaging, microbiology, ancillary and laboratory) are listed below for reference.     Microbiology: Recent Results (from the past 240 hour(s))  Culture, blood (Routine x 2)     Status: Abnormal   Collection Time: 05/03/17 10:30 AM  Result Value Ref Range Status   Specimen Description BLOOD RIGHT ARM  Final   Special Requests   Final    BOTTLES DRAWN AEROBIC AND ANAEROBIC Blood Culture adequate volume   Culture  Setup Time   Final    IN BOTH AEROBIC AND ANAEROBIC BOTTLES GRAM POSITIVE COCCI IN CHAINS CRITICAL RESULT CALLED TO, READ BACK BY AND VERIFIED WITH: B. Shirlee [id number]/02/2017 T. TYSOR    Culture GROUP B STREP(S.AGALACTIAE)ISOLATED (A)  Final    Report Status 05/06/2017 FINAL  Final   Organism ID, Bacteria GROUP B STREP(S.AGALACTIAE)ISOLATED  Final      Susceptibility   Group b strep(s.agalactiae)isolated - MIC*    CLINDAMYCIN <=0.25 SENSITIVE Sensitive     AMPICILLIN <=0.25 SENSITIVE Sensitive     ERYTHROMYCIN <=0.12 SENSITIVE Sensitive     VANCOMYCIN 0.5 SENSITIVE Sensitive     CEFTRIAXONE <=0.12 SENSITIVE Sensitive     LEVOFLOXACIN 0.5 SENSITIVE Sensitive     * GROUP B STREP(S.AGALACTIAE)ISOLATED  Blood Culture ID Panel (Reflexed)     Status: Abnormal   Collection Time: 05/03/17 10:30 AM  Result Value Ref Range Status   Enterococcus species NOT DETECTED NOT DETECTED Final   Vancomycin resistance NOT DETECTED NOT DETECTED Final   Listeria monocytogenes NOT DETECTED NOT DETECTED Final   Staphylococcus species NOT DETECTED NOT DETECTED Final   Staphylococcus aureus NOT DETECTED NOT DETECTED Final   Methicillin resistance NOT DETECTED NOT DETECTED Final   Streptococcus species DETECTED (A) NOT DETECTED Final    Comment: CRITICAL RESULT CALLED TO, READ BACK BY AND VERIFIED WITH: B. GREEN,PHARMD 0233 05/04/2017 T. TYSOR    Streptococcus agalactiae DETECTED (A) NOT DETECTED Final    Comment: CRITICAL RESULT CALLED TO, READ BACK BY AND VERIFIED WITH: B. GREEN,PHARMD 0233 05/04/2017 T. TYSOR    Streptococcus pneumoniae NOT DETECTED NOT DETECTED Final   Streptococcus pyogenes NOT DETECTED NOT DETECTED Final   Acinetobacter baumannii NOT DETECTED NOT DETECTED Final   Enterobacteriaceae species NOT DETECTED NOT DETECTED Final   Enterobacter cloacae complex NOT DETECTED NOT DETECTED Final   Escherichia coli NOT DETECTED NOT DETECTED Final   Klebsiella oxytoca NOT DETECTED NOT DETECTED Final   Klebsiella pneumoniae NOT DETECTED NOT DETECTED Final   Proteus species NOT DETECTED NOT DETECTED Final   Serratia marcescens NOT DETECTED NOT DETECTED Final   Carbapenem resistance NOT DETECTED NOT DETECTED Final   Haemophilus influenzae NOT  DETECTED NOT DETECTED Final   Neisseria meningitidis NOT DETECTED NOT DETECTED Final   Pseudomonas aeruginosa NOT DETECTED NOT DETECTED Final   Candida albicans NOT DETECTED NOT DETECTED Final   Candida glabrata NOT DETECTED NOT DETECTED Final   Candida krusei NOT DETECTED NOT DETECTED Final   Candida parapsilosis NOT DETECTED NOT DETECTED Final   Candida tropicalis NOT DETECTED NOT DETECTED Final  Culture, blood (Routine x 2)     Status: Abnormal   Collection Time: 05/03/17 12:32 PM  Result Value Ref Range Status   Specimen Description BLOOD RIGHT FOREARM  Final   Special Requests   Final    BOTTLES DRAWN AEROBIC AND ANAEROBIC Blood Culture adequate volume   Culture  Setup Time   Final    GRAM POSITIVE COCCI IN CHAINS IN BOTH AEROBIC AND ANAEROBIC BOTTLES CRITICAL RESULT CALLED TO, READ BACK BY AND VERIFIED WITH: M RENZ,PHARMD AT 0908 05/04/17 BY L BENFIELD    Culture (A)  Final    GROUP B STREP(S.AGALACTIAE)ISOLATED SUSCEPTIBILITIES PERFORMED ON PREVIOUS CULTURE WITHIN THE LAST 5 DAYS. Performed at [facility], [place] [address] [place], [place] [address]    Report Status 05/06/2017 FINAL  Final  Urine culture     Status: None   Collection Time: 05/03/17  2:45 PM  Result Value Ref Range Status   Specimen Description URINE, RANDOM  Final   Special Requests NONE  Final   Culture   Final    NO GROWTH Performed at [facility], [place] [address] [place], [place] [address]    Report Status 05/05/2017 FINAL  Final  MRSA PCR Screening     Status: None   Collection Time: 05/03/17  8:37 PM  Result Value Ref Range Status   MRSA by PCR NEGATIVE NEGATIVE Final    Comment:        The GeneXpert MRSA Assay (FDA approved for NASAL specimens only), is one component of a comprehensive MRSA colonization surveillance program. It is not intended to diagnose MRSA infection nor to guide or monitor treatment for MRSA infections.   Culture, blood (routine x 2)     Status: None  (Preliminary result)  Collection Time: 05/06/17  5:31 AM  Result Value Ref Range Status   Specimen Description BLOOD RIGHT HAND  Final   Special Requests   Final    BOTTLES DRAWN AEROBIC ONLY Blood Culture adequate volume   Culture   Final    NO GROWTH 1 DAY Performed at [facility], [place] [address], [place], [place] [address]    Report Status PENDING  Incomplete  Culture, blood (routine x 2)     Status: None (Preliminary result)   Collection Time: 05/06/17  5:31 AM  Result Value Ref Range Status   Specimen Description BLOOD RIGHT ANTECUBITAL  Final   Special Requests   Final    BOTTLES DRAWN AEROBIC ONLY Blood Culture adequate volume   Culture   Final    NO GROWTH 1 DAY Performed at [facility], [place] [address] [place], [place] [address]    Report Status PENDING  Incomplete     Labs: Basic Metabolic Panel:  Recent Labs Lab 05/03/17 1030 05/04/17 0334 05/05/17 0540 05/08/17 0900  NA 130* 135 139 141  K 3.9 4.0 3.9 4.9  CL 98* 103 106 104  CO2 '23 23 25 27  '$ GLUCOSE 154* 131* 117* 149*  BUN '15 13 13 16  '$ CREATININE 0.94 0.94 0.91 0.93  CALCIUM 9.0 8.3* 8.5* 9.6   Liver Function Tests:  Recent Labs Lab 05/03/17 1030 05/04/17 0334  AST 43* 75*  ALT 54 92*  ALKPHOS 108 103  BILITOT 0.7 0.5  PROT 7.2 5.9*  ALBUMIN 3.5 2.9*   CBC:  Recent Labs Lab 05/03/17 1030 05/04/17 0334 05/05/17 0540 05/07/17 0444 05/08/17 0900  WBC 11.7* 18.8* 17.0* 12.2* 14.6*  NEUTROABS 3.2  --   --   --   --   HGB 13.1 11.4* 10.4* 10.8* 12.2*  HCT 38.1* 33.9* 30.7* 32.4* 36.2*  MCV 91.1 92.6 92.7 92.3 92.8  PLT 127* 110* 110* 112* 138*   Cardiac Enzymes:  Recent Labs Lab 05/03/17 1030  TROPONINI <0.03    SIGNED: Time coordinating discharge: 60 minutes  Faye Ramsay, MD  [facility] 05/08/2017, 11:36 AM Pager [phone]  If 7PM-7AM, please contact night-coverage [email or web address] Password [email or web address]

## 2017-05-08 NOTE — Care Management Note (Signed)
Case Management Note  Patient Details  Name: Todd Novak MRN: [id number] Date of Birth: 1965-09-06  Subjective/Objective:  [facility] already following for iv abx,picc line flush, & neb machine. No further CM needs.                  Action/Plan:d/c home w/[facility]HC/iv abx/neb machine.   Expected Discharge Date:  05/08/17               Expected Discharge Plan:  [place]  In-House Referral:     Discharge planning Services  Mobile Meals, CM Consult  Post Acute Care Choice:    Choice offered to:  Patient  DME Arranged:  Nebulizer machine DME Agency:     HH Arranged:  RN, IV Antibiotics HH Agency:  [facility]  Status of Service:  Completed, signed off  If discussed at [place] of Stay Meetings, dates discussed:    Additional Comments:  Dessa Phi, RN 05/08/2017, 11:55 AM

## 2017-05-08 NOTE — Discharge Instructions (Signed)
Bacteremia °Bacteremia is the presence of bacteria in the blood. When bacteria enter the bloodstream, they can cause a life-threatening reaction called sepsis, which is a medical emergency. Bacteremia can spread to other parts of the body, including the heart, joints, and brain. °What are the causes? °This condition is caused by bacteria that get into the blood. Bacteria can enter the blood: °· From a skin infection or a cut on your skin. °· During an episode of pneumonia. °· From an infection in your stomach or intestine (gastrointestinal infection). °· From an infection in your bladder or urinary system (urinary tract infection). °· During a dental or medical procedure. °· After you brush your teeth so hard that your gums bleed. °· When a bacterial infection in another part of the body spreads to the blood. °· Through a dirty needle. ° °What increases the risk? °This condition is more likely to develop in: °· Children. °· Elderly adults. °· People who have a long-lasting (chronic) disease or medical condition. °· People who have an artificial joint or heart valve. °· People who have heart valve disease. °· People who have a tube, such as a catheter or IV tube, that has been inserted for a medical treatment. °· People who have a weak body defense system (immune system). °· People who use IV drugs. ° °What are the signs or symptoms? °Symptoms of this condition include: °· Fever. °· Chills. °· A racing heart. °· Shortness of breath. °· Dizziness. °· Weakness. °· Confusion. °· Nausea or vomiting. °· Diarrhea. ° °In some cases, there are no symptoms. Bacteremia that has spread to the other parts of the body may cause symptoms in those areas. °How is this diagnosed? °This condition may be diagnosed with a physical exam and tests, such as: °· A complete blood count (CBC). This test looks for signs of infection. °· Blood cultures. These look for bacteria in your blood. °· Tests of any tubes that you may have inserted into  your body, such as an IV tube or urinary catheter. These tests look for a source of infection. °· Urine tests including urine cultures. These look for bacteria in the urine that could be a source of infection. °· Imaging tests, such as an X-ray, CT scan, MRI, or heart ultrasound. These look for a source of infection in other parts of the body, such as the lungs, heart valves, or joints. ° °How is this treated? °This condition may be treated with: °· Antibiotic medicines given through an IV infusion. Depending on the source of infection, antibiotics may be needed for several weeks. At first, an antibiotic may be given to kill most types of blood bacteria. If your test results show that a certain kind of bacteria is causing problems, the antibiotic may be changed to kill only the bacteria that are causing problems. °· Antibiotics taken by mouth. °· IV fluids to support the body as you fight the infection. °· Removing any catheter or device that could be a source of infection. °· Blood pressure and breathing support, if you have sepsis. °· Surgery to control the source or spread of infection, such as: °? Removing an infected implanted device. °? Removing infected tissue or an abscess. ° °This condition is usually treated at a hospital. If you are treated at home, you may need to come back for medicines, blood tests, and evaluation. This is important. °Follow these instructions at home: °· Take over-the-counter and prescription medicines only as told by your health care provider. °·   If you were prescribed an antibiotic, take it as told by your health care provider. Do not stop taking the antibiotic even if you start to feel better. °· Rest until your condition is under control. °· Drink enough fluid to keep your urine clear or pale yellow. °· Do not smoke. If you need help quitting, ask your health care provider. °· Keep all follow-up visits as told by your health care provider. This is important. °How is this  prevented? °· Get the vaccinations that your health care provider recommends. °· Clean and cover any scrapes or cuts. °· Take good care of your skin. This includes regular bathing and moisturizing. °· Wash your hands often. °· Practice good oral hygiene. Brush your teeth two times a day and floss regularly. °Get help right away if: °· You have pain. °· You have a fever. °· You have trouble breathing. °· Your skin becomes blotchy, pale, or clammy. °· You develop confusion, dizziness, or weakness. °· You develop diarrhea. °· You develop any new symptoms after treatment. °Summary °· Bacteremia is the presence of bacteria in the blood. When bacteria enter the bloodstream, they can cause a life- threatening reaction called sepsis. °· Children and elderly adults are at increased risk of bacteremia. Other risk factors include having a long-lasting (chronic) disease or a weak immune system, having an artificial joint or heart valve, having heart valve disease, having tubes that were inserted in the body for medical treatment, or using IV drugs. °· Some symptoms of bacteremia include fever, chills, shortness of breath, confusion, nausea or vomiting, and diarrhea. °· Tests may be done to diagnose a source of infection that led to bacteremia. These tests may include blood tests, urine tests, and imaging tests. °· Bacteremia is usually treated with antibiotics, usually in a hospital. °This information is not intended to replace advice given to you by your health care provider. Make sure you discuss any questions you have with your health care provider. °Document Released: 04/27/2006 Document Revised: 06/10/2016 Document Reviewed: 06/10/2016 °Elsevier Interactive Patient Education © 2018 [employer] ° °

## 2017-05-08 NOTE — Progress Notes (Signed)
    [facility] for [facility]   Reason for visit: Follow up on GBS bactermia  Interval History: repeat blood culture remains negative.  No fever.    Physical Exam: Constitutional:  Vitals:   05/08/17 0523 05/08/17 0937  BP: 106/83 (!) 130/97  Pulse: 83 89  Resp: 16   Temp: 98.7 F (37.1 C) 98.3 F (36.8 C)  SpO2: 97% 100%   patient appears in NAD  Impression: Stable bacteremia.    Plan: 1.  [place] for picc line today.   Ceftriaxone or penicillin through 10/18 Ok to pull picc line at the end of treatment I will follow up with him the week after and check blood cultures.

## 2017-05-08 NOTE — Progress Notes (Signed)
Contacted MD for permission for patient to leave the floor, MD states that it is ok for patient to leave the floor.

## 2017-05-09 DIAGNOSIS — J4550 Severe persistent asthma, uncomplicated: Secondary | ICD-10-CM | POA: Diagnosis not present

## 2017-05-09 DIAGNOSIS — A401 Sepsis due to streptococcus, group B: Secondary | ICD-10-CM | POA: Diagnosis not present

## 2017-05-09 DIAGNOSIS — B951 Streptococcus, group B, as the cause of diseases classified elsewhere: Secondary | ICD-10-CM | POA: Diagnosis not present

## 2017-05-11 ENCOUNTER — Telehealth: Payer: Self-pay | Admitting: Behavioral Health

## 2017-05-11 DIAGNOSIS — A419 Sepsis, unspecified organism: Secondary | ICD-10-CM | POA: Diagnosis not present

## 2017-05-11 LAB — CULTURE, BLOOD (ROUTINE X 2)
Culture: NO GROWTH
Culture: NO GROWTH
Special Requests: ADEQUATE
Special Requests: ADEQUATE

## 2017-05-11 NOTE — Telephone Encounter (Signed)
Patient voiced that so far everything is good & he's taking his medication as advised. Also stating, "that if things changes he will call the office & make an appointment with his PCP".

## 2017-05-12 ENCOUNTER — Telehealth: Payer: Self-pay | Admitting: *Deleted

## 2017-05-12 DIAGNOSIS — A401 Sepsis due to streptococcus, group B: Secondary | ICD-10-CM | POA: Diagnosis not present

## 2017-05-12 NOTE — Telephone Encounter (Signed)
Received results from [facility]; forwarded to provider/SLS 10/16

## 2017-05-13 ENCOUNTER — Other Ambulatory Visit: Payer: Self-pay | Admitting: Gastroenterology

## 2017-05-13 ENCOUNTER — Telehealth: Payer: Self-pay | Admitting: Family

## 2017-05-13 DIAGNOSIS — A401 Sepsis due to streptococcus, group B: Secondary | ICD-10-CM | POA: Diagnosis not present

## 2017-05-13 DIAGNOSIS — B951 Streptococcus, group B, as the cause of diseases classified elsewhere: Secondary | ICD-10-CM | POA: Diagnosis not present

## 2017-05-13 NOTE — Telephone Encounter (Signed)
Please contact pt to arrange hospital follow up. 

## 2017-05-17 NOTE — Telephone Encounter (Signed)
Please contact pt and let him know that his echocardiogram shows a leaky valve and an enlarged heart.  I would like him to see cardiology for further evaluation and would also like to see him back I the office please.

## 2017-05-18 NOTE — Telephone Encounter (Signed)
Notified pt and he voices understanding. He is upset that he "was told in the hospital that his heart was fine and we are telling him something different". I apologized for any confusion and reassured pt that PCP could discuss further with him at upcoming appt. Pt states he has had other testing done since hospital visit and will gather that information and bring with him to his follow up.

## 2017-05-18 NOTE — Telephone Encounter (Signed)
Notified pt. He states he was told in the hospital that his heart workup was normal. He wants to make sure we have the right pt regarding the echocardiogram and result and if so, why would they tell him his heart was normal. Reports that he had an extensive cardiac workup last year and is hesitant to proceed with cardiology referral at this time. He did schedule a hospital follow up for 06/01/17 at 10:20am.  Please advise?

## 2017-05-18 NOTE — Telephone Encounter (Signed)
Yes, I reviewed Echo performed on 05/06/17.  If he has further questions we can review at his appointment in November.

## 2017-05-20 ENCOUNTER — Inpatient Hospital Stay: Payer: BLUE CROSS/BLUE SHIELD | Admitting: Infectious Disease

## 2017-05-20 ENCOUNTER — Inpatient Hospital Stay: Payer: BLUE CROSS/BLUE SHIELD | Admitting: Internal Medicine

## 2017-05-25 ENCOUNTER — Ambulatory Visit (INDEPENDENT_AMBULATORY_CARE_PROVIDER_SITE_OTHER): Payer: BLUE CROSS/BLUE SHIELD | Admitting: Internal Medicine

## 2017-05-25 ENCOUNTER — Encounter: Payer: Self-pay | Admitting: Internal Medicine

## 2017-05-25 VITALS — BP 123/88 | HR 110 | Temp 98.2°F | Ht 68.0 in | Wt 194.0 lb

## 2017-05-25 DIAGNOSIS — Z7189 Other specified counseling: Secondary | ICD-10-CM | POA: Diagnosis not present

## 2017-05-25 DIAGNOSIS — A401 Sepsis due to streptococcus, group B: Secondary | ICD-10-CM

## 2017-05-25 DIAGNOSIS — T732XXD Exhaustion due to exposure, subsequent encounter: Secondary | ICD-10-CM | POA: Diagnosis not present

## 2017-05-25 DIAGNOSIS — Z7185 Encounter for immunization safety counseling: Secondary | ICD-10-CM | POA: Insufficient documentation

## 2017-05-25 DIAGNOSIS — M25512 Pain in left shoulder: Secondary | ICD-10-CM

## 2017-05-25 LAB — CBC WITH DIFFERENTIAL/PLATELET
Basophils Absolute: 28 cells/uL (ref 0–200)
Basophils Relative: 0.1 %
Eosinophils Absolute: 83 cells/uL (ref 15–500)
Eosinophils Relative: 0.3 %
HCT: 34.5 % — ABNORMAL LOW (ref 38.5–50.0)
Hemoglobin: 11.9 g/dL — ABNORMAL LOW (ref 13.2–17.1)
Lymphs Abs: 22465 cells/uL — ABNORMAL HIGH (ref 850–3900)
MCH: 30.6 pg (ref 27.0–33.0)
MCHC: 34.5 g/dL (ref 32.0–36.0)
MCV: 88.7 fL (ref 80.0–100.0)
MPV: 11.7 fL (ref 7.5–12.5)
Monocytes Relative: 6.6 %
Neutro Abs: 3296 cells/uL (ref 1500–7800)
Neutrophils Relative %: 11.9 %
Platelets: 131 10*3/uL — ABNORMAL LOW (ref 140–400)
RBC: 3.89 10*6/uL — ABNORMAL LOW (ref 4.20–5.80)
RDW: 13 % (ref 11.0–15.0)
Total Lymphocyte: 81.1 %
WBC mixed population: 1828 cells/uL — ABNORMAL HIGH (ref 200–950)
WBC: 27.7 10*3/uL — ABNORMAL HIGH (ref 3.8–10.8)

## 2017-05-25 NOTE — Progress Notes (Signed)
   Subjective:    Patient ID: Todd Novak, male    DOB: 1965-12-23, 51 y.o.   MRN: [id number]  HPI Here for follow up of GBS bacteremia. Had bacteremia in the setting of pneumonia.  2/2 blood cultures positive.  Repeat blood cultures negative.  History of CLL.  TEE negative for vegetation.  Completed 2 weeks of IV antibiotics and feels well now.  Some fatigue that is progressively improving.  Had shoulder pain during the hospital but has improved and not c/w infectious septic arthritis. Had one episode of fever about 10 days ago x 1 and none since.  No associated diarrhea.  No rashes.  Back to work.     Review of Systems  Constitutional: Positive for fatigue. Negative for chills, fever and unexpected weight change.  Skin: Negative for rash.  Neurological: Negative for dizziness.       Objective:   Physical Exam  Constitutional: He appears well-developed and well-nourished. No distress.  HENT:  Mouth/Throat: No oropharyngeal exudate.  Eyes: No scleral icterus.  Cardiovascular: Normal rate, regular rhythm and normal heart sounds.   No murmur heard. Pulmonary/Chest: Effort normal and breath sounds normal. No respiratory distress.  Lymphadenopathy:    He has no cervical adenopathy.  Skin: No rash noted.   SH: quit drinking with this episode; no tobacco       Assessment & Plan:

## 2017-05-25 NOTE — Assessment & Plan Note (Signed)
This is improving with OTC therapy.  Not c/w infection.

## 2017-05-25 NOTE — Assessment & Plan Note (Signed)
Resolved and off of antibiotics.  I will repeat blood culture to assure clearance.  rtc prn

## 2017-05-25 NOTE — Assessment & Plan Note (Signed)
Likely related to illness and improving.  Will take 3-6 months for recovery typically.

## 2017-05-25 NOTE — Assessment & Plan Note (Signed)
Counseled on the flu shot and given today

## 2017-05-31 LAB — CULTURE, BLOOD (SINGLE)
MICRO NUMBER:: 81209326
Result:: NO GROWTH
SPECIMEN QUALITY:: ADEQUATE

## 2017-06-01 ENCOUNTER — Ambulatory Visit: Payer: BLUE CROSS/BLUE SHIELD | Admitting: Family

## 2017-06-01 DIAGNOSIS — Z0289 Encounter for other administrative examinations: Secondary | ICD-10-CM

## 2017-06-17 ENCOUNTER — Other Ambulatory Visit: Payer: Self-pay | Admitting: Gastroenterology

## 2017-07-18 ENCOUNTER — Other Ambulatory Visit: Payer: Self-pay | Admitting: Gastroenterology

## 2017-07-22 ENCOUNTER — Other Ambulatory Visit: Payer: Self-pay | Admitting: Family

## 2017-08-10 ENCOUNTER — Telehealth: Payer: Self-pay

## 2017-08-10 NOTE — Telephone Encounter (Signed)
Follow up [facility]  call made to patient. Left message for return call regarding scheduling appointment with M. O'sullivan (per [facility]) for possible referral to [facility] department due to fall and problem with hand/finger.

## 2017-09-27 ENCOUNTER — Telehealth: Payer: Self-pay | Admitting: Family

## 2017-09-28 NOTE — Telephone Encounter (Signed)
2 weeks supply of lisinopril sent to pharmacy until pt can schedule appt with PCP as he is past due. Please call pt to schedule appt soon, thanks!

## 2017-09-29 ENCOUNTER — Other Ambulatory Visit: Payer: Self-pay | Admitting: Family

## 2017-09-29 NOTE — Telephone Encounter (Signed)
Called pt and scheduled appt for 10/14/17 @9am . Pt is out of town working so this is the first date he will be able to get in to see pcp.

## 2017-10-01 ENCOUNTER — Telehealth: Payer: Self-pay | Admitting: *Deleted

## 2017-10-01 NOTE — Telephone Encounter (Signed)
Received Home Health Certification an d Plan of Care; forwarded to provider/SLS 03/07

## 2017-10-08 NOTE — Telephone Encounter (Signed)
[facility] called to receive back the forms that were faxed over, see telephone note below [place] @ [phone] ext [phone]

## 2017-10-09 NOTE — Telephone Encounter (Signed)
I placed in Dr. Frederik Pear folder on Wednesday for signature.

## 2017-10-09 NOTE — Telephone Encounter (Signed)
Todd Novak-- have these come back to you?

## 2017-10-12 NOTE — Telephone Encounter (Addendum)
They are still in Dr. Frederik Pear red folder, as she was out office Wednesday pm, Thursday and Friday; and also today. Will have her sign tomorrow morning, or would you like me to have another provider sign of on them?.Daisey Must 03/18

## 2017-10-12 NOTE — Telephone Encounter (Signed)
Paperwork signed and faxed to Jones Skene at [facility]/SLS 03/18

## 2017-10-14 ENCOUNTER — Ambulatory Visit: Payer: BLUE CROSS/BLUE SHIELD | Admitting: Family

## 2017-10-20 ENCOUNTER — Ambulatory Visit (HOSPITAL_BASED_OUTPATIENT_CLINIC_OR_DEPARTMENT_OTHER)
Admission: RE | Admit: 2017-10-20 | Discharge: 2017-10-20 | Disposition: A | Discharge status: Home / Self Care | Payer: BLUE CROSS/BLUE SHIELD | Source: Ambulatory Visit | Attending: Family | Admitting: Family

## 2017-10-20 ENCOUNTER — Encounter: Payer: Self-pay | Admitting: Family

## 2017-10-20 ENCOUNTER — Ambulatory Visit (INDEPENDENT_AMBULATORY_CARE_PROVIDER_SITE_OTHER): Payer: BLUE CROSS/BLUE SHIELD | Admitting: Family

## 2017-10-20 VITALS — BP 144/92 | HR 80 | Temp 98.2°F | Resp 16 | Ht 68.0 in | Wt 202.8 lb

## 2017-10-20 DIAGNOSIS — N529 Male erectile dysfunction, unspecified: Secondary | ICD-10-CM

## 2017-10-20 DIAGNOSIS — R6882 Decreased libido: Secondary | ICD-10-CM | POA: Diagnosis not present

## 2017-10-20 DIAGNOSIS — I10 Essential (primary) hypertension: Secondary | ICD-10-CM

## 2017-10-20 DIAGNOSIS — R059 Cough, unspecified: Secondary | ICD-10-CM

## 2017-10-20 DIAGNOSIS — R05 Cough: Secondary | ICD-10-CM | POA: Diagnosis not present

## 2017-10-20 DIAGNOSIS — J069 Acute upper respiratory infection, unspecified: Secondary | ICD-10-CM

## 2017-10-20 DIAGNOSIS — B9789 Other viral agents as the cause of diseases classified elsewhere: Secondary | ICD-10-CM | POA: Diagnosis not present

## 2017-10-20 DIAGNOSIS — J202 Acute bronchitis due to streptococcus: Secondary | ICD-10-CM

## 2017-10-20 LAB — BASIC METABOLIC PANEL
BUN: 12 mg/dL (ref 6–23)
CO2: 29 mEq/L (ref 19–32)
Calcium: 9.6 mg/dL (ref 8.4–10.5)
Chloride: 107 mEq/L (ref 96–112)
Creatinine, Ser: 1.03 mg/dL (ref 0.40–1.50)
GFR: 80.45 mL/min (ref >60.00)
Glucose, Bld: 99 mg/dL (ref 70–99)
Potassium: 4.6 mEq/L (ref 3.5–5.1)
Sodium: 141 mEq/L (ref 135–145)

## 2017-10-20 MED ORDER — LISINOPRIL 20 MG PO TABS: ORAL_TABLET | ORAL | 5 refills | Status: DC

## 2017-10-20 MED ORDER — ALBUTEROL SULFATE HFA 108 (90 BASE) MCG/ACT IN AERS: 2.0000 | INHALATION_SPRAY | Freq: Four times a day (QID) | RESPIRATORY_TRACT | 0 refills | Status: DC | PRN

## 2017-10-20 MED ORDER — OMEPRAZOLE 40 MG PO CPDR: 40.0000 mg | DELAYED_RELEASE_CAPSULE | Freq: Two times a day (BID) | ORAL | 11 refills | Status: DC

## 2017-10-20 MED ORDER — SILDENAFIL CITRATE 20 MG PO TABS: ORAL_TABLET | ORAL | 0 refills | Status: DC

## 2017-10-20 NOTE — Progress Notes (Signed)
Subjective:    Patient ID: Todd Novak, male    DOB: Sep 10, 1965, 52 y.o.   MRN: [id number]  HPI  HTN- Lisinopril 20mg .  Reports good compliance with medication.  He reports that he has had a lot of stress this morning and has been running around which is why he feels his pressure is up.  BP Readings from Last 3 Encounters:  10/20/17 (!) 144/92  05/25/17 123/88  05/08/17 103/63   GERD- maintained on PPI. Reports stable symptoms, restart if he stops PPI.    Started with URI symptoms on 10/16/17. Reports that he is starting to get some wheezing.  Denies fever.  Some head congestion.  Of note he was admitted for PNA GBS sepsis 10/18 and was d/c'd home on IV rocephin.    He also has concern about intermittent issues with erectile dysfunction.  Also notes that his  libido is somewhat diminished.  Review of Systems   See HPI  Past Medical History:  Diagnosis Date  . Asthma    childhood, resolved  . CLL (chronic lymphocytic leukemia) ([place])   . Environmental allergies   . Fatigue   . GERD (gastroesophageal reflux disease)   . Heart murmur   . HTN (hypertension)      Social History   Socioeconomic History  . Marital status: Married    Spouse name: Not on file  . Number of children: 3  . Years of education: Not on file  . Highest education level: Not on file  Occupational History  . Occupation: Press photographer   Social Needs  . Financial resource strain: Not on file  . Food insecurity:    Worry: Not on file    Inability: Not on file  . Transportation needs:    Medical: Not on file    Non-medical: Not on file  Tobacco Use  . Smoking status: Never Smoker  . Smokeless tobacco: Never Used  Substance and Sexual Activity  . Alcohol use: No    Alcohol/week: 1.8 oz    Types: 3 Glasses of wine per week  . Drug use: No  . Sexual activity: Not on file  Lifestyle  . Physical activity:    Days per week: Not on file    Minutes per session: Not on file  . Stress: Not on file    Relationships  . Social connections:    Talks on phone: Not on file    Gets together: Not on file    Attends religious service: Not on file    Active member of club or organization: Not on file    Attends meetings of clubs or organizations: Not on file    Relationship status: Not on file  . Intimate partner violence:    Fear of current or ex partner: Not on file    Emotionally abused: Not on file    Physically abused: Not on file    Forced sexual activity: Not on file  Other Topics Concern  . Not on file  Social History Narrative  . Not on file    Past Surgical History:  Procedure Laterality Date  . APPENDECTOMY    . CHOLECYSTECTOMY    . TEE WITHOUT CARDIOVERSION N/A 05/06/2017   Procedure: TRANSESOPHAGEAL ECHOCARDIOGRAM (TEE);  Surgeon: Sueanne Margarita, MD;  Location: [facility] ENDOSCOPY;  Service: Cardiovascular;  Laterality: N/A;  . WISDOM TOOTH EXTRACTION      Family History  Problem Relation Age of Onset  . Hypertension Mother  Living  . Hypertension Father 47       Deceased  . Hypertension Maternal Grandmother   . Hypertension Maternal Grandfather   . Healthy Sister   . Healthy Son        x1  . Healthy Daughter        x2  . Cancer Neg Hx   . Diabetes Neg Hx   . Heart disease Neg Hx     Allergies  Allergen Reactions  . Methocarbamol Nausea Only    dizziness  . Tramadol Nausea And Vomiting    Current Outpatient Medications on File Prior to Visit  Medication Sig Dispense Refill  . acetaminophen (TYLENOL) 325 MG tablet Take 650 mg by mouth every 4 (four) hours as needed for mild pain, moderate pain or headache.     . Ascorbic Acid (VITAMIN C) 100 MG tablet Take 100 mg by mouth daily.    . fexofenadine (ALLEGRA) 180 MG tablet Take 1 tablet (180 mg total) by mouth daily. 30 tablet 5  . ibuprofen (ADVIL,MOTRIN) 400 MG tablet Take 1 tablet (400 mg total) by mouth every 4 (four) hours as needed for fever or moderate pain. 30 tablet 0  . LYSINE PO Take 1 tablet  by mouth daily.    . Multiple Vitamin (MULTIVITAMIN) tablet Take 1 tablet by mouth daily.    . vitamin E 400 UNIT capsule Take 400 Units by mouth daily.     No current facility-administered medications on file prior to visit.     BP (!) 144/92 (BP Location: Right Arm, Patient Position: Sitting, Cuff Size: Large)   Pulse 80   Temp 98.2 F (36.8 C) (Oral)   Resp 16   Ht 5\' 8"  (1.727 m)   Wt 202 lb 12.8 oz (92 kg)   SpO2 99%   BMI 30.84 kg/m       Objective:   Physical Exam  Constitutional: He is oriented to person, place, and time. He appears well-developed and well-nourished. No distress.  HENT:  Head: Normocephalic and atraumatic.  Cardiovascular: Normal rate and regular rhythm.  No murmur heard. Pulmonary/Chest: Effort normal and breath sounds normal. No respiratory distress. He has no wheezes. He has no rales.  Musculoskeletal: He exhibits no edema.  Neurological: He is alert and oriented to person, place, and time.  Skin: Skin is warm and dry.  Psychiatric: He has a normal mood and affect. His behavior is normal. Thought content normal.          Assessment & Plan:  Viral upper respiratory infection with cough-we will obtain chest x-ray to ensure resolution of previous infiltrates noted in the fall into make sure that he has no developing pneumonia.  I will add an albuterol inhaler to use as needed for wheezing.  He is advised to call if symptoms worsen, if he develops fever or if his symptoms are not improved in 3-4 days.  Erectile dysfunction-will give trial of as needed sildenafil.  We will also check a serum testosterone level.  Hypertension-blood pressure is up slightly today.  We will continue current dose of lisinopril and plan to repeat blood pressure in 3 months.  He did take a dose of DayQuil this morning which may have raise his blood pressure.  Obtain follow-up basic metabolic panel to assess potassium and electrolytes on lisinopril.  GERD-stable on PPI,  continue same.  He plans to continue to work on diet and weight loss.

## 2017-10-20 NOTE — Patient Instructions (Signed)
Please complete lab work prior to leaving. Complete chest x ray on the first floor. Call if new/worsening symptoms, fever >101, or if you are not improved in 3-4 days.

## 2017-10-21 LAB — TESTOSTERONE TOTAL,FREE,BIO, MALES
Albumin: 4.5 g/dL (ref 3.6–5.1)
Sex Hormone Binding: 37 nmol/L (ref 10–50)
Testosterone, Bioavailable: 110.6 ng/dL (ref >=110.0)
Testosterone, Free: 53.8 pg/mL (ref 46.0–224.0)
Testosterone: 446 ng/dL (ref 250–827)

## 2017-11-23 ENCOUNTER — Ambulatory Visit: Payer: Self-pay | Admitting: *Deleted

## 2017-11-23 ENCOUNTER — Telehealth: Payer: Self-pay | Admitting: Family

## 2017-11-23 ENCOUNTER — Encounter: Payer: Self-pay | Admitting: Family Medicine

## 2017-11-23 ENCOUNTER — Ambulatory Visit (INDEPENDENT_AMBULATORY_CARE_PROVIDER_SITE_OTHER): Payer: BLUE CROSS/BLUE SHIELD | Admitting: Family Medicine

## 2017-11-23 VITALS — BP 132/90 | HR 73 | Temp 98.2°F | Ht 69.0 in | Wt 202.2 lb

## 2017-11-23 DIAGNOSIS — G510 Bell's palsy: Secondary | ICD-10-CM

## 2017-11-23 DIAGNOSIS — C9190 Lymphoid leukemia, unspecified not having achieved remission: Secondary | ICD-10-CM

## 2017-11-23 DIAGNOSIS — C9110 Chronic lymphocytic leukemia of B-cell type not having achieved remission: Secondary | ICD-10-CM

## 2017-11-23 MED ORDER — VALACYCLOVIR HCL 500 MG PO TABS: 1000.0000 mg | ORAL_TABLET | Freq: Three times a day (TID) | ORAL | 0 refills | Status: DC

## 2017-11-23 MED ORDER — HYDROCODONE-ACETAMINOPHEN 5-325 MG PO TABS: 0.5000 | ORAL_TABLET | Freq: Four times a day (QID) | ORAL | 0 refills | Status: DC | PRN

## 2017-11-23 MED ORDER — PREDNISONE 20 MG PO TABS: 60.0000 mg | ORAL_TABLET | Freq: Every day | ORAL | 0 refills | Status: DC

## 2017-11-23 NOTE — Telephone Encounter (Signed)
Copied from [place] [id number]. Topic: Quick Communication - Other Results >> Nov 23, 2017 11:29 AM Genella Rife H wrote: Patient wants to transfer from [facility] to Dr. Nani Ravens. Please advise.

## 2017-11-23 NOTE — Progress Notes (Signed)
Chief Complaint  Patient presents with  . pain in back of head and right side facial numbness   Subjective: Patient is a 52 y.o. male here for neck pain and facial droop.  Several days ago started having neck pain on R starting behind ear and radiating through temple. Tylenol and ibuprof haven't been helpful. 1 d ago he started having R facial numbness and droop. Also has been having difficulty closing his eye. No redness or drainage. Denies fevers, has hx of CLL, notes his glands and LN's are more swollen than they have ever been. Did travel to [facility] within past couple mo, but denies bites or rashes. Also denies trouble swallowing, slurred speech, new balance issues, weakness.   ROS: Const: No fevers Neuro: As noted in HPI  Family History  Problem Relation Age of Onset  . Hypertension Mother        Living  . Hypertension Father 2       Deceased  . Hypertension Maternal Grandmother   . Hypertension Maternal Grandfather   . Healthy Sister   . Healthy Son        x1  . Healthy Daughter        x2  . Cancer Neg Hx   . Diabetes Neg Hx   . Heart disease Neg Hx    Past Medical History:  Diagnosis Date  . Asthma    childhood, resolved  . CLL (chronic lymphocytic leukemia) ([place])   . Environmental allergies   . Fatigue   . GERD (gastroesophageal reflux disease)   . Heart murmur   . HTN (hypertension)    Allergies  Allergen Reactions  . Codeine   . Methocarbamol Nausea Only    dizziness  . Tramadol Nausea And Vomiting    Current Outpatient Medications:  .  acetaminophen (TYLENOL) 325 MG tablet, Take 650 mg by mouth every 4 (four) hours as needed for mild pain, moderate pain or headache. , Disp: , Rfl:  .  albuterol (PROVENTIL HFA;VENTOLIN HFA) 108 (90 Base) MCG/ACT inhaler, Inhale 2 puffs into the lungs every 6 (six) hours as needed for wheezing or shortness of breath., Disp: 1 Inhaler, Rfl: 0 .  Ascorbic Acid (VITAMIN C) 100 MG tablet, Take 100 mg by mouth daily., Disp: , Rfl:   .  fexofenadine (ALLEGRA) 180 MG tablet, Take 1 tablet (180 mg total) by mouth daily., Disp: 30 tablet, Rfl: 5 .  ibuprofen (ADVIL,MOTRIN) 400 MG tablet, Take 1 tablet (400 mg total) by mouth every 4 (four) hours as needed for fever or moderate pain., Disp: 30 tablet, Rfl: 0 .  lisinopril (PRINIVIL,ZESTRIL) 20 MG tablet, TAKE 1 TABLET BY MOUTH DAILY. PATIENT NEEDS OFFICE VISIT FOR REFILLS., Disp: 30 tablet, Rfl: 5 .  LYSINE PO, Take 1 tablet by mouth daily., Disp: , Rfl:  .  Multiple Vitamin (MULTIVITAMIN) tablet, Take 1 tablet by mouth daily., Disp: , Rfl:  .  omeprazole (PRILOSEC) 40 MG capsule, Take 1 capsule (40 mg total) by mouth 2 (two) times daily., Disp: 60 capsule, Rfl: 11 .  sildenafil (REVATIO) 20 MG tablet, Take 1-2 tabs 30 minutes prior to sexual activity, Disp: 50 tablet, Rfl: 0 .  vitamin E 400 UNIT capsule, Take 400 Units by mouth daily., Disp: , Rfl:  .  HYDROcodone-acetaminophen (NORCO/VICODIN) 5-325 MG tablet, Take 0.5-1 tablets by mouth every 6 (six) hours as needed for moderate pain., Disp: 10 tablet, Rfl: 0 .  predniSONE (DELTASONE) 20 MG tablet, Take 3 tablets (60 mg total) by  mouth daily with breakfast for 7 days., Disp: 21 tablet, Rfl: 0 .  valACYclovir (VALTREX) 500 MG tablet, Take 2 tablets (1,000 mg total) by mouth 3 (three) times daily for 7 days., Disp: 42 tablet, Rfl: 0  Objective: BP 132/90 (BP Location: Left Arm, Patient Position: Sitting, Cuff Size: Normal)   Pulse 73   Temp 98.2 F (36.8 C) (Oral)   Ht 5\' 9"  (1.753 m)   Wt 202 lb 4 oz (91.7 kg)   SpO2 98%   BMI 29.87 kg/m  General: Awake, appears stated age HEENT: MMM, EOMi, R eyelid does not fully close without manual pressure or more intense effort Heart: RRR, no LE edema Lungs: CTAB, no rales, wheezes or rhonchi. No accessory muscle use Neuro: DTRs equal and symmetric, no cerebellar signs, speech fluent, +R sided facial droop, speech fluent, gait nml MSK: 5/5 strength throughout, equal Heme: Very  large LN's and glands, post-aur on R enlarged and ttp, no fluctuance Psych: Age appropriate judgment and insight, normal affect and mood  Assessment and Plan: Bell's palsy - Plan: Ambulatory referral to Ophthalmology, predniSONE (DELTASONE) 20 MG tablet, valACYclovir (VALTREX) 500 MG tablet  Orders as above. 7 d total. Hopefully steroid improves s/s's, Small course of Norco called in for breakthrough pain.  Refer to ophtho, artif tears rec'd also.  Letter for work given stating he may return Th, sooner if better.  F/u in 2 weeks. The patient voiced understanding and agreement to the plan.   [place], DO 11/23/17  12:49 PM

## 2017-11-23 NOTE — Telephone Encounter (Signed)
Ok with me 

## 2017-11-23 NOTE — Telephone Encounter (Signed)
Thanks. PCP changed.

## 2017-11-23 NOTE — Progress Notes (Signed)
Pre visit review using our clinic review tool, if applicable. No additional management support is needed unless otherwise documented below in the visit note. 

## 2017-11-23 NOTE — Patient Instructions (Addendum)
I hope to get you in with the eye doctor in the next week.  Artificial tears like Refresh and Systane may be used for comfort. OK to get generic version. Generally people use them every 2-4 hours, but you can use them as much as you want because there is no medication in it.  If you do not hear anything about your referral in the next 1-2 days, call our office and ask for an update.  Do not drink alcohol, do any illicit/street drugs, drive or do anything that requires alertness while on this medicine.   Let us know if you need anything.

## 2017-11-23 NOTE — Telephone Encounter (Signed)
OK w me.  

## 2017-11-23 NOTE — Telephone Encounter (Signed)
Pt  Reports  3  Days  Of  Numbness  To  r  Side  Face  With  Headache   And  Swelling  Of lymph nodes   Lip  Is numb  But speech is clear and intact. Pt  Wishes  To be  Seen in office today. Appointment made with Dr Nani Ravens for  Today at 1045 am    Reason for Disposition . [1] Numbness (i.e., loss of sensation) of the face, arm / hand, or leg / foot on one side of the body AND [2] gradual onset (e.g., days to weeks) AND [3] present now  Answer Assessment - Initial Assessment Questions 1. SYMPTOM: "What is the main symptom you are concerned about?" (e.g., weakness, numbness)   Numbness R  Side  Face  Tearing  r  Eye  Cannot blink   2. ONSET: "When did this start?" (minutes, hours, days; while sleeping)      3  Days    3. LAST NORMAL: "When was the last time you were normal (no symptoms)?"      Friday  4. PATTERN "Does this come and go, or has it been constant since it started?"  "Is it present now?"       Constant   5. CARDIAC SYMPTOMS: "Have you had any of the following symptoms: chest pain, difficulty breathing, palpitations?"        no 6. NEUROLOGIC SYMPTOMS: "Have you had any of the following symptoms: headache, dizziness, vision loss, double vision, changes in speech, unsteady on your feet?"     Headache  r   Side  Dizziness  numbr  Side  Lip    7. OTHER SYMPTOMS: "Do you have any other symptoms?"     Both  Lymphnodes  Swelling    8. PREGNANCY: "Is there any chance you are pregnant?" "When was your last menstrual period?"     n/a  Protocols used: NEUROLOGIC DEFICIT-A-AH

## 2017-12-04 ENCOUNTER — Other Ambulatory Visit: Payer: Self-pay | Admitting: Family Medicine

## 2017-12-04 DIAGNOSIS — G510 Bell's palsy: Secondary | ICD-10-CM

## 2017-12-10 ENCOUNTER — Ambulatory Visit: Payer: BLUE CROSS/BLUE SHIELD | Admitting: Family Medicine

## 2017-12-10 ENCOUNTER — Telehealth: Payer: Self-pay | Admitting: Family Medicine

## 2017-12-10 NOTE — Telephone Encounter (Signed)
Patient informed PCP stated no need for more steroid. And no need to followup if doing better. The patient agreed

## 2017-12-10 NOTE — Telephone Encounter (Signed)
Patient missed appt today 12/10/17---he completely forgot and thought was next week.   He apologized over and over again as never misses appts. Bell's palsy is cleared up. Doing so much better on steroid, a little pain in the back. Has one pill left for today. Wonders if need a refill to completely be 100% well or just wait. AGAIN so sorry for missing appt, would not have done, just thought next week. Gave front staff a message to not charge for not coming for appointment.

## 2018-04-01 ENCOUNTER — Telehealth: Payer: Self-pay

## 2018-04-01 NOTE — Telephone Encounter (Signed)
PA initiated via Covermymeds; KEY: [id number]. PA approved.   Questionnaire submitted. PA Case [id number] Status: Approved. Authorization and Notifications Completed.

## 2018-04-29 ENCOUNTER — Other Ambulatory Visit: Payer: Self-pay | Admitting: Family Medicine

## 2018-04-29 DIAGNOSIS — G510 Bell's palsy: Secondary | ICD-10-CM

## 2018-05-13 ENCOUNTER — Encounter (HOSPITAL_COMMUNITY): Payer: Self-pay | Admitting: Emergency Medicine

## 2018-05-31 ENCOUNTER — Other Ambulatory Visit: Payer: Self-pay | Admitting: Family

## 2018-05-31 NOTE — Telephone Encounter (Signed)
Schedule pt for physical or med ck please. Ty.

## 2018-06-14 ENCOUNTER — Other Ambulatory Visit: Payer: Self-pay | Admitting: Family Medicine

## 2018-06-14 DIAGNOSIS — M25512 Pain in left shoulder: Secondary | ICD-10-CM

## 2018-06-30 ENCOUNTER — Other Ambulatory Visit: Payer: Self-pay | Admitting: Family Medicine

## 2018-07-30 ENCOUNTER — Other Ambulatory Visit: Payer: Self-pay | Admitting: Family Medicine

## 2018-08-19 ENCOUNTER — Other Ambulatory Visit: Payer: Self-pay | Admitting: Family Medicine

## 2018-08-19 MED ORDER — LISINOPRIL 20 MG PO TABS: 20.0000 mg | ORAL_TABLET | Freq: Every day | ORAL | 0 refills | Status: DC

## 2018-08-19 NOTE — Telephone Encounter (Signed)
Pt has appt 08/23/2018

## 2018-08-19 NOTE — Telephone Encounter (Signed)
Copied from [place]. Topic: Quick Communication - Rx Refill/Question >> Aug 19, 2018  1:16 PM Colvin Caroli N wrote: Medication: lisinopril (PRINIVIL,ZESTRIL) 20 MG tablet  Patient is requesting a refill of this medication.   Preferred Pharmacy (with phone number or street name):[id number] - [place], [place] - [address] AT [place] [phone] (Phone) [phone] (Fax)

## 2018-08-23 ENCOUNTER — Ambulatory Visit (HOSPITAL_BASED_OUTPATIENT_CLINIC_OR_DEPARTMENT_OTHER)
Admission: RE | Admit: 2018-08-23 | Discharge: 2018-08-23 | Disposition: A | Discharge status: Home / Self Care | Payer: BLUE CROSS/BLUE SHIELD | Source: Ambulatory Visit | Attending: Family Medicine | Admitting: Family Medicine

## 2018-08-23 ENCOUNTER — Ambulatory Visit (INDEPENDENT_AMBULATORY_CARE_PROVIDER_SITE_OTHER): Payer: BLUE CROSS/BLUE SHIELD | Admitting: Family Medicine

## 2018-08-23 ENCOUNTER — Encounter: Payer: Self-pay | Admitting: Family Medicine

## 2018-08-23 VITALS — BP 140/90 | HR 87 | Temp 98.0°F | Ht 69.0 in | Wt 205.2 lb

## 2018-08-23 DIAGNOSIS — N529 Male erectile dysfunction, unspecified: Secondary | ICD-10-CM

## 2018-08-23 DIAGNOSIS — Z23 Encounter for immunization: Secondary | ICD-10-CM

## 2018-08-23 DIAGNOSIS — J45990 Exercise induced bronchospasm: Secondary | ICD-10-CM

## 2018-08-23 DIAGNOSIS — K219 Gastro-esophageal reflux disease without esophagitis: Secondary | ICD-10-CM

## 2018-08-23 DIAGNOSIS — Z8719 Personal history of other diseases of the digestive system: Secondary | ICD-10-CM

## 2018-08-23 DIAGNOSIS — I10 Essential (primary) hypertension: Secondary | ICD-10-CM

## 2018-08-23 DIAGNOSIS — C9110 Chronic lymphocytic leukemia of B-cell type not having achieved remission: Secondary | ICD-10-CM | POA: Diagnosis not present

## 2018-08-23 DIAGNOSIS — M79645 Pain in left finger(s): Secondary | ICD-10-CM | POA: Insufficient documentation

## 2018-08-23 DIAGNOSIS — S6992XA Unspecified injury of left wrist, hand and finger(s), initial encounter: Secondary | ICD-10-CM | POA: Diagnosis not present

## 2018-08-23 DIAGNOSIS — B001 Herpesviral vesicular dermatitis: Secondary | ICD-10-CM | POA: Diagnosis not present

## 2018-08-23 MED ORDER — TADALAFIL 5 MG PO TABS: 5.0000 mg | ORAL_TABLET | Freq: Every day | ORAL | 0 refills | Status: DC | PRN

## 2018-08-23 MED ORDER — LISINOPRIL 20 MG PO TABS: 20.0000 mg | ORAL_TABLET | Freq: Every day | ORAL | 2 refills | Status: DC

## 2018-08-23 MED ORDER — VALACYCLOVIR HCL 1 G PO TABS: ORAL_TABLET | ORAL | 1 refills | Status: DC

## 2018-08-23 MED ORDER — ALBUTEROL SULFATE HFA 108 (90 BASE) MCG/ACT IN AERS: 2.0000 | INHALATION_SPRAY | Freq: Four times a day (QID) | RESPIRATORY_TRACT | 1 refills | Status: DC | PRN

## 2018-08-23 NOTE — Progress Notes (Signed)
CC: HTN  Subjective Todd Novak is a 53 y.o. male who presents for hypertension follow up. He does not monitor home blood pressures. He is compliant with medications-lisinopril 20 mg daily, did not take today. Patient has these side effects of medication: none He is adhering to a healthy diet overall. Current exercise: walking   Has a history of exercise-induced asthma.  This is much better from when he was a child.  He takes albuterol intermittently when he is physically active.  This seems to help immediately.  He is not having to use this more than once per week.  He has a history of recurrent cold sores.  He was told he might need to take a preventative medication for this.  This happens 3-4 times per year.  He is interested in having Valtrex refilled as this has been helpful in the past.  He has a history of CLL.  He used to follow with oncology but states he has been living in denial and has not been back in over 2 years.  He would like to have his counts checked today.  He feels fine overall.  He does have swollen lymph nodes.  Patient has a history of reflux and Barrett's esophagus.  His last GI appointment was over 2 years ago.  He is tried to go on an H2 blocker but symptoms are not controlled.  When he takes omeprazole twice daily, his symptoms are controlled.  He would like a refill.  He has a history of left thumb pain.  He dropped a piece of equipment on it 1 year ago.  He had a lot of swelling and bruising.  His range of motion never quite came back.  He never had any imaging or sought care for this issue.  He has a history of erectile dysfunction.  Sildenafil has not been as helpful lately.  He feels very anxious prior to becoming intimate with his wife.  He has had the same partner for the last 30 years.   Past Medical History:  Diagnosis Date  . Asthma    childhood, resolved  . CLL (chronic lymphocytic leukemia) ([place])   . Environmental allergies   . Fatigue   . GERD  (gastroesophageal reflux disease)   . Heart murmur   . HTN (hypertension)     Review of Systems Cardiovascular: no chest pain Respiratory:  no current shortness of breath GI:+reflux GU: +ED Heme: +swollen LN's Skin: No rash Eyes: No recent vision changes Const: No wt loss Neuro: No facial droop Nose: No congestion  Exam BP 140/90 (BP Location: Left Arm, Patient Position: Sitting, Cuff Size: Normal)   Pulse 87   Temp 98 F (36.7 C) (Oral)   Ht 5\' 9"  (1.753 m)   Wt 205 lb 4 oz (93.1 kg)   SpO2 97%   BMI 30.31 kg/m  General:  well developed, well nourished, in no apparent distress Heme: +cerv and submand lymphadenopathy Heart: RRR, no bruits, no LE edema Lungs: clear to auscultation, no accessory muscle use Psych: well oriented with normal range of affect and appropriate judgment/insight  Essential hypertension - Plan: lisinopril (PRINIVIL,ZESTRIL) 20 MG tablet, Basic metabolic panel  Recurrent cold sores - Plan: valACYclovir (VALTREX) 1000 MG tablet  Exercise-induced asthma - Plan: albuterol (PROVENTIL HFA;VENTOLIN HFA) 108 (90 Base) MCG/ACT inhaler  History of Barrett's esophagus  Gastroesophageal reflux disease, esophagitis presence not specified  CLL (chronic lymphocytic leukemia) ([facility]) - Plan: CBC  Pain of left thumb - Plan: DG Finger  Thumb Left  Erectile dysfunction, unspecified erectile dysfunction type - Plan: tadalafil (CIALIS) 5 MG tablet  Need for influenza vaccination - Plan: Flu Vaccine QUAD 6+ mos PF IM (Fluarix Quad PF)  Continue lisinopril, check BMP, counseled on diet and exercise. Continue Valtrex 2 mg for 2 doses as needed. Albuterol as needed refilled. I would like him to contact his gastroenterologist for maintenance appointments.  He will likely need serial endoscopies. Check CBC on behalf of his oncology team but I did recommend he get back in touch with him for yearly visits. Check x-ray for his thumb, could be posttraumatic arthritic  changes or traumatic injury to that did not heal correctly. Trial Cialis 5-20 mg daily as needed.  Could consider urology referral if this is not helpful.  I did give him information for counseling as it sounds like he is having psychologic barriers. F/u in 6 months for CPE pending above. The patient voiced understanding and agreement to the plan.  [place], DO 08/23/18  4:49 PM

## 2018-08-23 NOTE — Patient Instructions (Addendum)
Give [place] 2-3 business days to get the results of your labs back.   Please consider counseling. Contact [phone] to schedule an appointment or inquire about cost/insurance coverage.  Keep the diet clean and stay active.  Call your gastroenterologist for an appointment regarding your Barrett's esophagus. [phone].  Let me know when your last tetanus shot was.  Let us know if you need anything.

## 2018-08-24 ENCOUNTER — Encounter: Payer: Self-pay | Admitting: Family Medicine

## 2018-08-24 LAB — BASIC METABOLIC PANEL
BUN: 12 mg/dL (ref 6–23)
CO2: 30 mEq/L (ref 19–32)
Calcium: 9.8 mg/dL (ref 8.4–10.5)
Chloride: 103 mEq/L (ref 96–112)
Creatinine, Ser: 1.11 mg/dL (ref 0.40–1.50)
GFR: 69.21 mL/min (ref >60.00)
Glucose, Bld: 89 mg/dL (ref 70–99)
Potassium: 5 mEq/L (ref 3.5–5.1)
Sodium: 139 mEq/L (ref 135–145)

## 2018-08-24 LAB — CBC
HCT: 40.8 % (ref 39.0–52.0)
Hemoglobin: 14.1 g/dL (ref 13.0–17.0)
MCHC: 34.6 g/dL (ref 30.0–36.0)
MCV: 93.3 fl (ref 78.0–100.0)
Platelets: 82 10*3/uL — ABNORMAL LOW (ref 150.0–400.0)
RBC: 4.38 Mil/uL (ref 4.22–5.81)
RDW: 12.8 % (ref 11.5–15.5)
WBC: 17.7 10*3/uL — ABNORMAL HIGH (ref 4.0–10.5)

## 2018-09-06 ENCOUNTER — Ambulatory Visit (HOSPITAL_BASED_OUTPATIENT_CLINIC_OR_DEPARTMENT_OTHER)
Admission: RE | Admit: 2018-09-06 | Discharge: 2018-09-06 | Disposition: A | Discharge status: Home / Self Care | Payer: BLUE CROSS/BLUE SHIELD | Source: Ambulatory Visit | Attending: Internal Medicine | Admitting: Internal Medicine

## 2018-09-06 ENCOUNTER — Encounter: Payer: Self-pay | Admitting: Internal Medicine

## 2018-09-06 ENCOUNTER — Ambulatory Visit (INDEPENDENT_AMBULATORY_CARE_PROVIDER_SITE_OTHER): Payer: BLUE CROSS/BLUE SHIELD | Admitting: Internal Medicine

## 2018-09-06 VITALS — BP 128/84 | HR 99 | Temp 98.2°F | Resp 16 | Ht 69.0 in | Wt 204.5 lb

## 2018-09-06 DIAGNOSIS — R05 Cough: Secondary | ICD-10-CM | POA: Diagnosis not present

## 2018-09-06 DIAGNOSIS — R0602 Shortness of breath: Secondary | ICD-10-CM | POA: Diagnosis not present

## 2018-09-06 DIAGNOSIS — R059 Cough, unspecified: Secondary | ICD-10-CM

## 2018-09-06 MED ORDER — AZELASTINE HCL 0.1 % NA SOLN: 2.0000 | Freq: Every evening | NASAL | 3 refills | Status: DC | PRN

## 2018-09-06 MED ORDER — BENZONATATE 200 MG PO CAPS: 200.0000 mg | ORAL_CAPSULE | Freq: Three times a day (TID) | ORAL | 0 refills | Status: DC | PRN

## 2018-09-06 MED ORDER — DOXYCYCLINE HYCLATE 100 MG PO TABS: 100.0000 mg | ORAL_TABLET | Freq: Two times a day (BID) | ORAL | 0 refills | Status: DC

## 2018-09-06 NOTE — Patient Instructions (Addendum)
Get a chest XR  Rest, fluids , tylenol  For cough:  Take Mucinex DM or robitussin DM as needed until better Also tessalon perles    Albuterol as needed for wheezing but also for persistent cough   For nasal congestion: Use OTC   Flonase : 2 nasal sprays on each side of the nose in the morning until you feel better Use ASTELIN a prescribed spray : 2 nasal sprays on each side of the nose at night until you feel better    Take the antibiotic as prescribed  (doxy)  Call if not gradually better over the next  10 days  Call anytime if the symptoms are severe

## 2018-09-06 NOTE — Progress Notes (Signed)
Pre visit review using our clinic review tool, if applicable. No additional management support is needed unless otherwise documented below in the visit note. 

## 2018-09-06 NOTE — Progress Notes (Signed)
Subjective:    Patient ID: Todd Novak, male    DOB: Apr 16, 1966, 53 y.o.   MRN: [id number]  DOS:  09/06/2018 Type of visit - description: Acute visit 2 weeks ago, he had some mild upper respiratory symptoms; according to the patient the symptoms got worse after a flu shot. Delete now has persistent cough, unable to sleep, + sneezing, + rib pain from coughing. Taking Robitussin with partial help. Has been taking also albuterol although he is not wheezing much.   Review of Systems No fever chills + Sinus pain and congestion. No chest pain other than the rib pain from coughing.  Some shortness of breath. Had some nausea but no vomiting or diarrhea. Little sputum production.  Past Medical History:  Diagnosis Date  . Asthma    childhood, resolved  . CLL (chronic lymphocytic leukemia) ([place])   . Environmental allergies   . Fatigue   . GERD (gastroesophageal reflux disease)   . Heart murmur   . HTN (hypertension)     Past Surgical History:  Procedure Laterality Date  . APPENDECTOMY    . CHOLECYSTECTOMY    . TEE WITHOUT CARDIOVERSION N/A 05/06/2017   Procedure: TRANSESOPHAGEAL ECHOCARDIOGRAM (TEE);  Surgeon: Sueanne Margarita, MD;  Location: [facility] ENDOSCOPY;  Service: Cardiovascular;  Laterality: N/A;  . WISDOM TOOTH EXTRACTION      Social History   Socioeconomic History  . Marital status: Married    Spouse name: Not on file  . Number of children: 3  . Years of education: Not on file  . Highest education level: Not on file  Occupational History  . Occupation: Press photographer   Social Needs  . Financial resource strain: Not on file  . Food insecurity:    Worry: Not on file    Inability: Not on file  . Transportation needs:    Medical: Not on file    Non-medical: Not on file  Tobacco Use  . Smoking status: Never Smoker  . Smokeless tobacco: Never Used  Substance and Sexual Activity  . Alcohol use: No    Alcohol/week: 3.0 standard drinks    Types: 3 Glasses of wine per week   . Drug use: No  . Sexual activity: Not on file  Lifestyle  . Physical activity:    Days per week: Not on file    Minutes per session: Not on file  . Stress: Not on file  Relationships  . Social connections:    Talks on phone: Not on file    Gets together: Not on file    Attends religious service: Not on file    Active member of club or organization: Not on file    Attends meetings of clubs or organizations: Not on file    Relationship status: Not on file  . Intimate partner violence:    Fear of current or ex partner: Not on file    Emotionally abused: Not on file    Physically abused: Not on file    Forced sexual activity: Not on file  Other Topics Concern  . Not on file  Social History Narrative  . Not on file      Allergies as of 09/06/2018      Reactions   Codeine    Methocarbamol Nausea Only   dizziness   Tramadol Nausea And Vomiting      Medication List       Accurate as of September 06, 2018 11:59 PM. Always use your most recent med list.  acetaminophen 325 MG tablet Commonly known as:  TYLENOL Take 650 mg by mouth every 4 (four) hours as needed for mild pain, moderate pain or headache.   albuterol 108 (90 Base) MCG/ACT inhaler Commonly known as:  PROVENTIL HFA;VENTOLIN HFA Inhale 2 puffs into the lungs every 6 (six) hours as needed for wheezing or shortness of breath.   azelastine 0.1 % nasal spray Commonly known as:  ASTELIN Place 2 sprays into both nostrils at bedtime as needed for rhinitis. Use in each nostril as directed   benzonatate 200 MG capsule Commonly known as:  TESSALON Take 1 capsule (200 mg total) by mouth 3 (three) times daily as needed for cough.   doxycycline 100 MG tablet Commonly known as:  VIBRA-TABS Take 1 tablet (100 mg total) by mouth 2 (two) times daily.   fexofenadine 180 MG tablet Commonly known as:  ALLEGRA Take 1 tablet (180 mg total) by mouth daily.   lisinopril 20 MG tablet Commonly known as:   PRINIVIL,ZESTRIL Take 1 tablet (20 mg total) by mouth daily.   LYSINE PO Take 1 tablet by mouth daily.   multivitamin tablet Take 1 tablet by mouth daily.   omeprazole 40 MG capsule Commonly known as:  PRILOSEC Take 1 capsule (40 mg total) by mouth 2 (two) times daily.   sildenafil 20 MG tablet Commonly known as:  REVATIO Take 1-2 tabs 30 minutes prior to sexual activity   tadalafil 5 MG tablet Commonly known as:  CIALIS Take 1-4 tablets (5-20 mg total) by mouth daily as needed for erectile dysfunction.   valACYclovir 1000 MG tablet Commonly known as:  VALTREX TAKE 2 TABLETS BY MOUTH AT ONSET OF COLD SORE, THEN 2 TABLETS 12 HOURS LATER   vitamin C 100 MG tablet Take 100 mg by mouth daily.   vitamin E 400 UNIT capsule Take 400 Units by mouth daily.           Objective:   Physical Exam BP 128/84 (BP Location: Left Arm, Patient Position: Sitting, Cuff Size: Normal)   Pulse 99   Temp 98.2 F (36.8 C) (Oral)   Resp 16   Ht 5\' 9"  (1.753 m)   Wt 204 lb 8 oz (92.8 kg)   SpO2 96%   BMI 30.20 kg/m  General:   Well developed, NAD, BMI noted. HEENT:  Normocephalic . Face symmetric, atraumatic.  TMs normal, nose moderately congested, throat symmetric not red. Lungs:  Increased expiratory time with cough otherwise clear.  No crackles. Normal respiratory effort, no intercostal retractions, no accessory muscle use. Heart: RRR,  no murmur.  No pretibial edema bilaterally  Skin: Not pale. Not jaundice Neurologic:  alert & oriented X3.  Speech normal, gait appropriate for age and unassisted Psych--  Cognition and judgment appear intact.  Cooperative with normal attention span and concentration.  Behavior appropriate. No anxious or depressed appearing.      Assessment    54 year old male,PMH includes asthma, CLL, GERD, HTN, heart murmur presents with  Cough: Persistent cough for several days, he reports some shortness of breath.on exam O2 sat is good, he does not  look in distress and breathing is  not labored. DDX includes atypical infection, viral syndrome, less likely pneumonia.  Patient is quite concerned about a pneumonia Plan: Chest x-ray Doxycycline, cough control with OTCs Tessalon Perles.  Also albuterol as needed.  See AVS.  Cannot take codeine or "almost any painkiller" thus unable to prescribe other cough suppressants. Call if not gradually better

## 2018-10-02 ENCOUNTER — Other Ambulatory Visit: Payer: Self-pay | Admitting: Internal Medicine

## 2018-10-28 ENCOUNTER — Other Ambulatory Visit: Payer: Self-pay | Admitting: Family

## 2018-10-31 IMAGING — DX DG SHOULDER 2+V*L*
3 series · 3 of 3 positions shown · non-contrast
Comparison: None.

CLINICAL DATA: Left shoulder pain without injury.

EXAM:
LEFT SHOULDER - 2+ VIEW

[shoulder grashey]
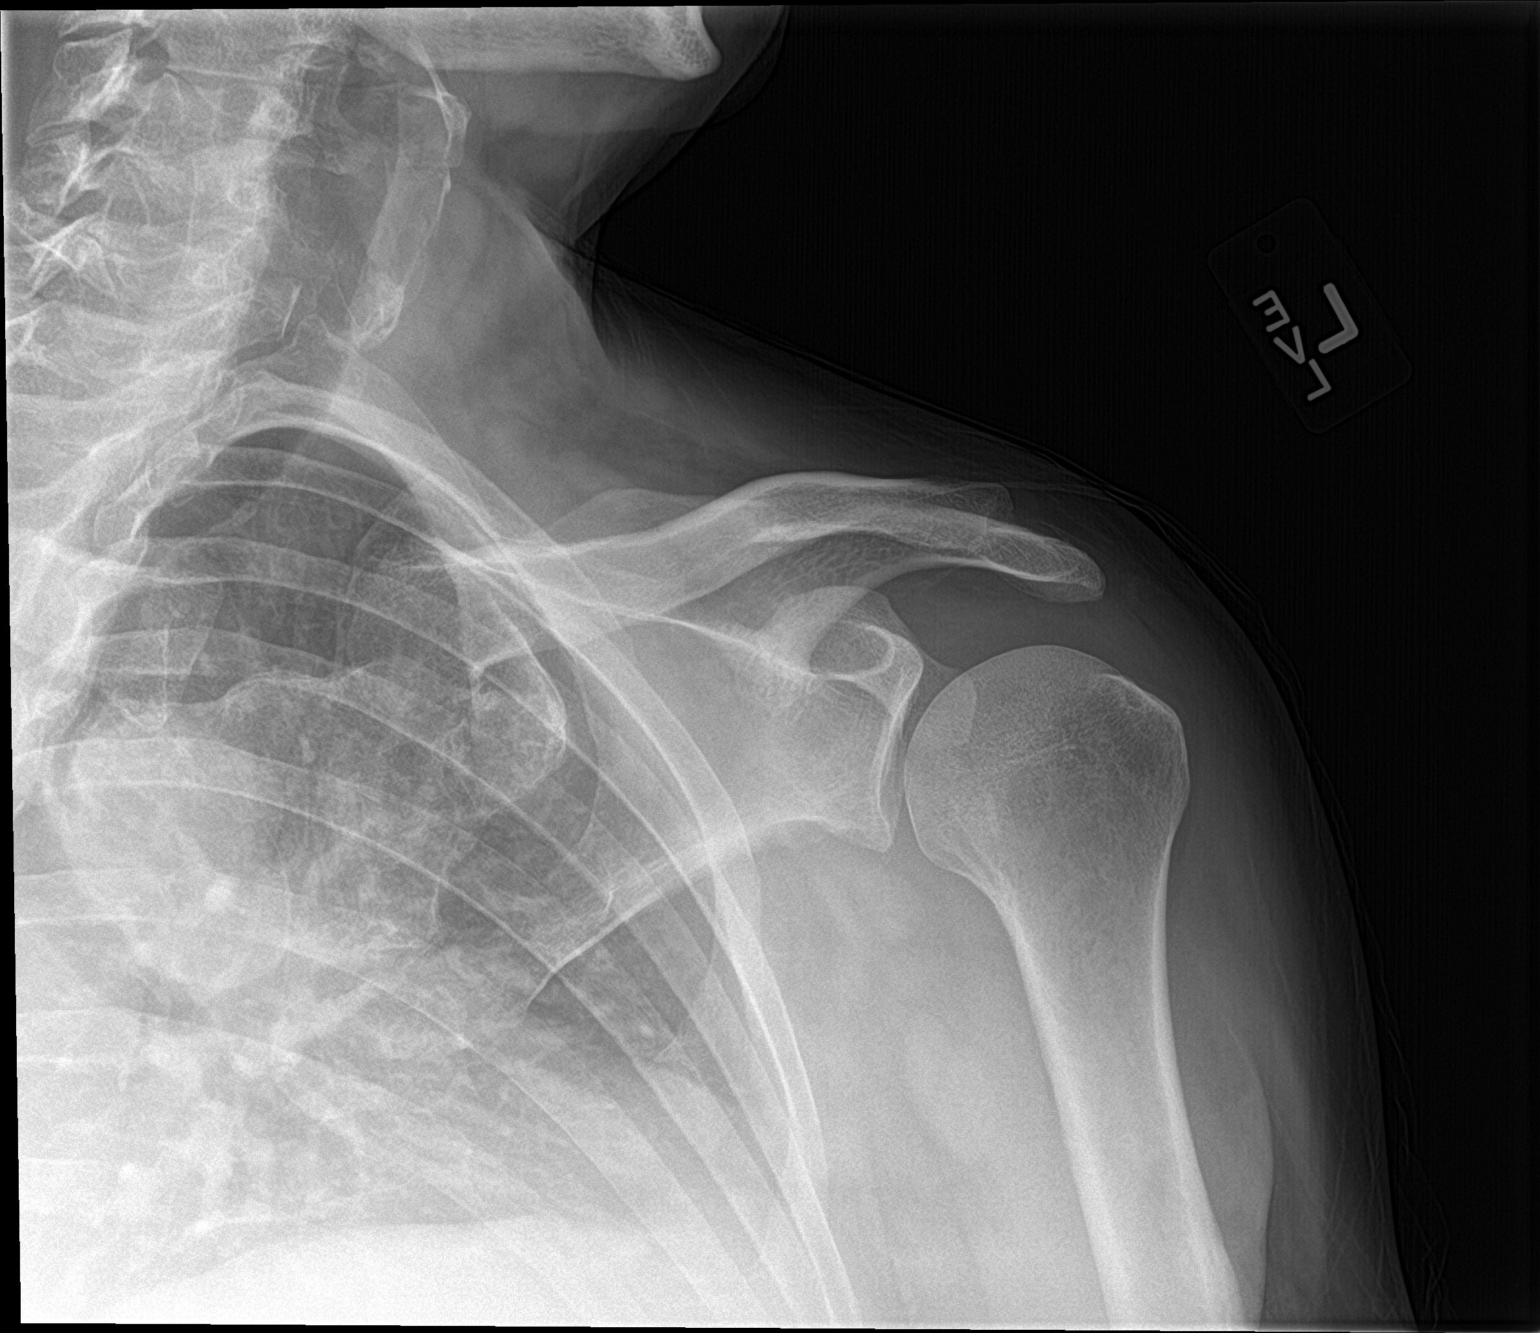

[shoulder y view]
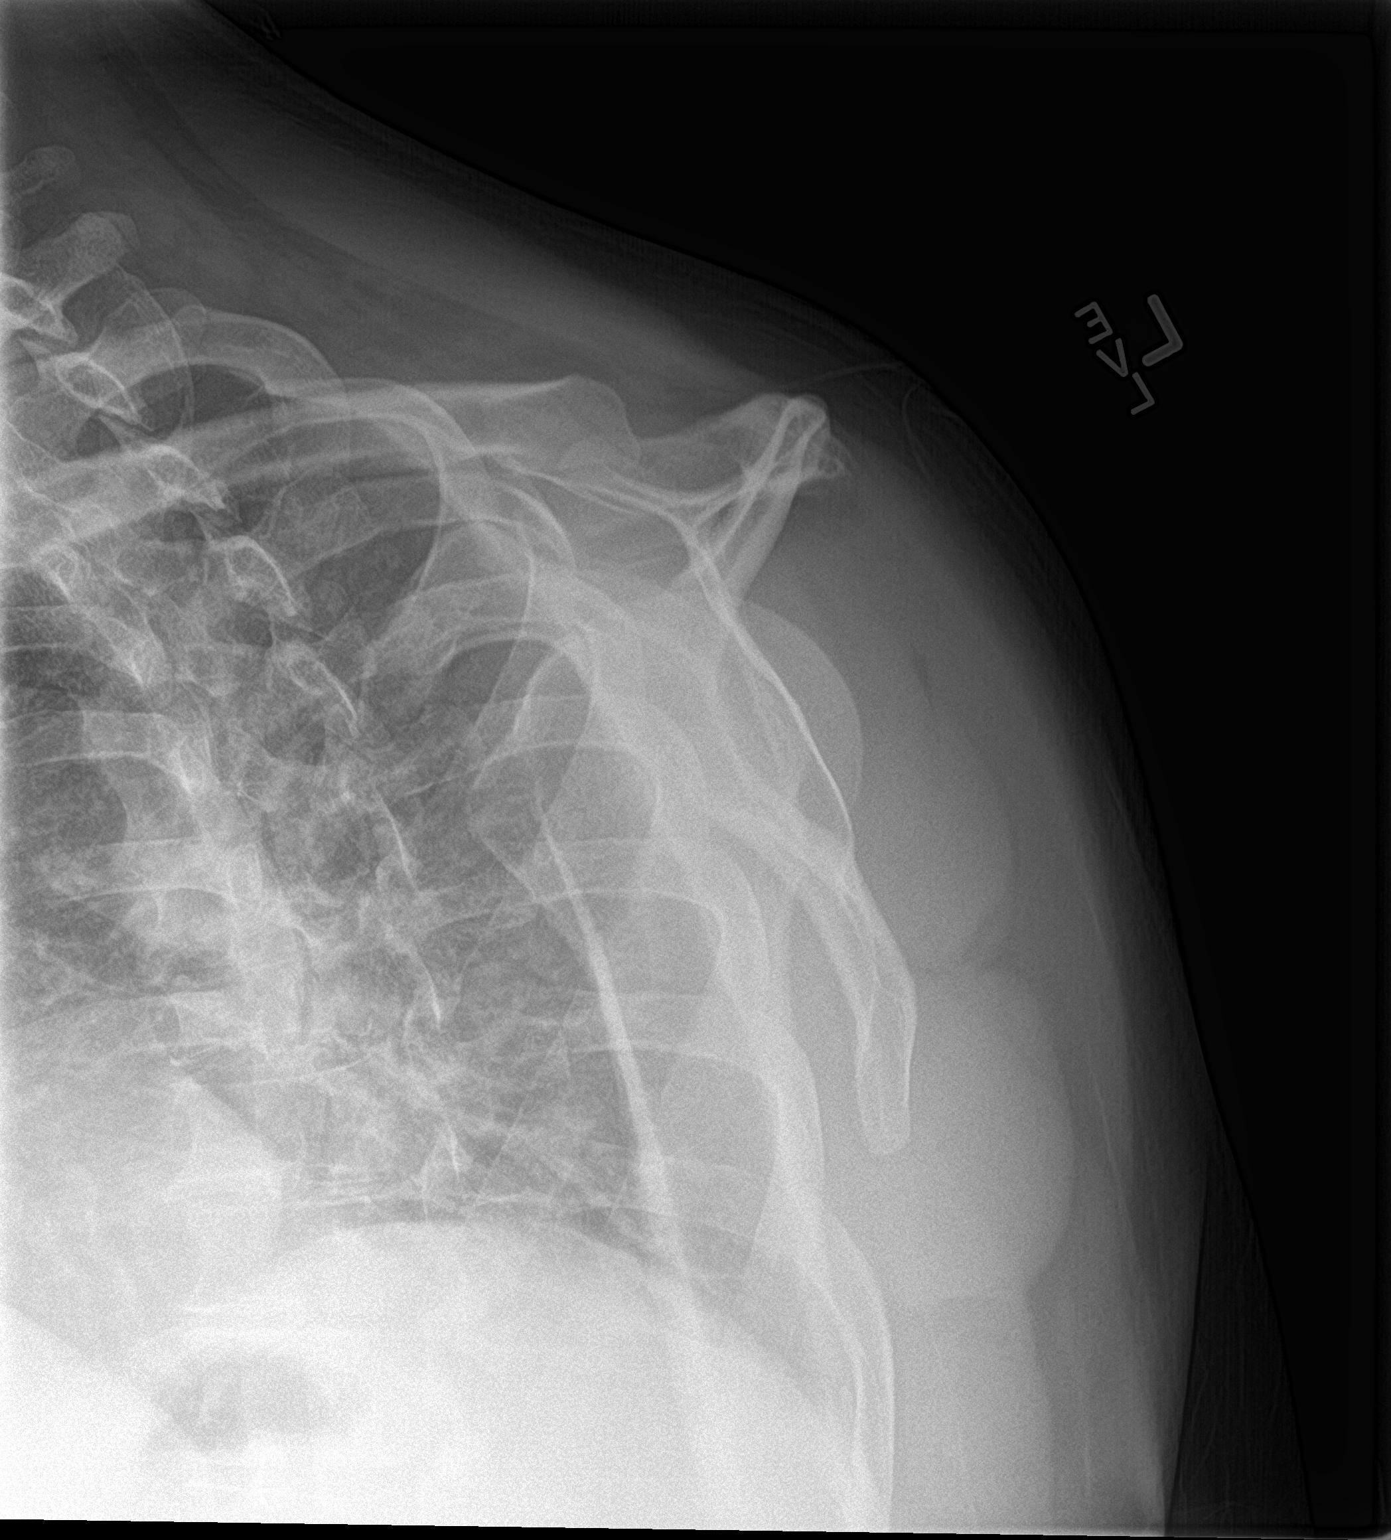

[shoulder axillary]
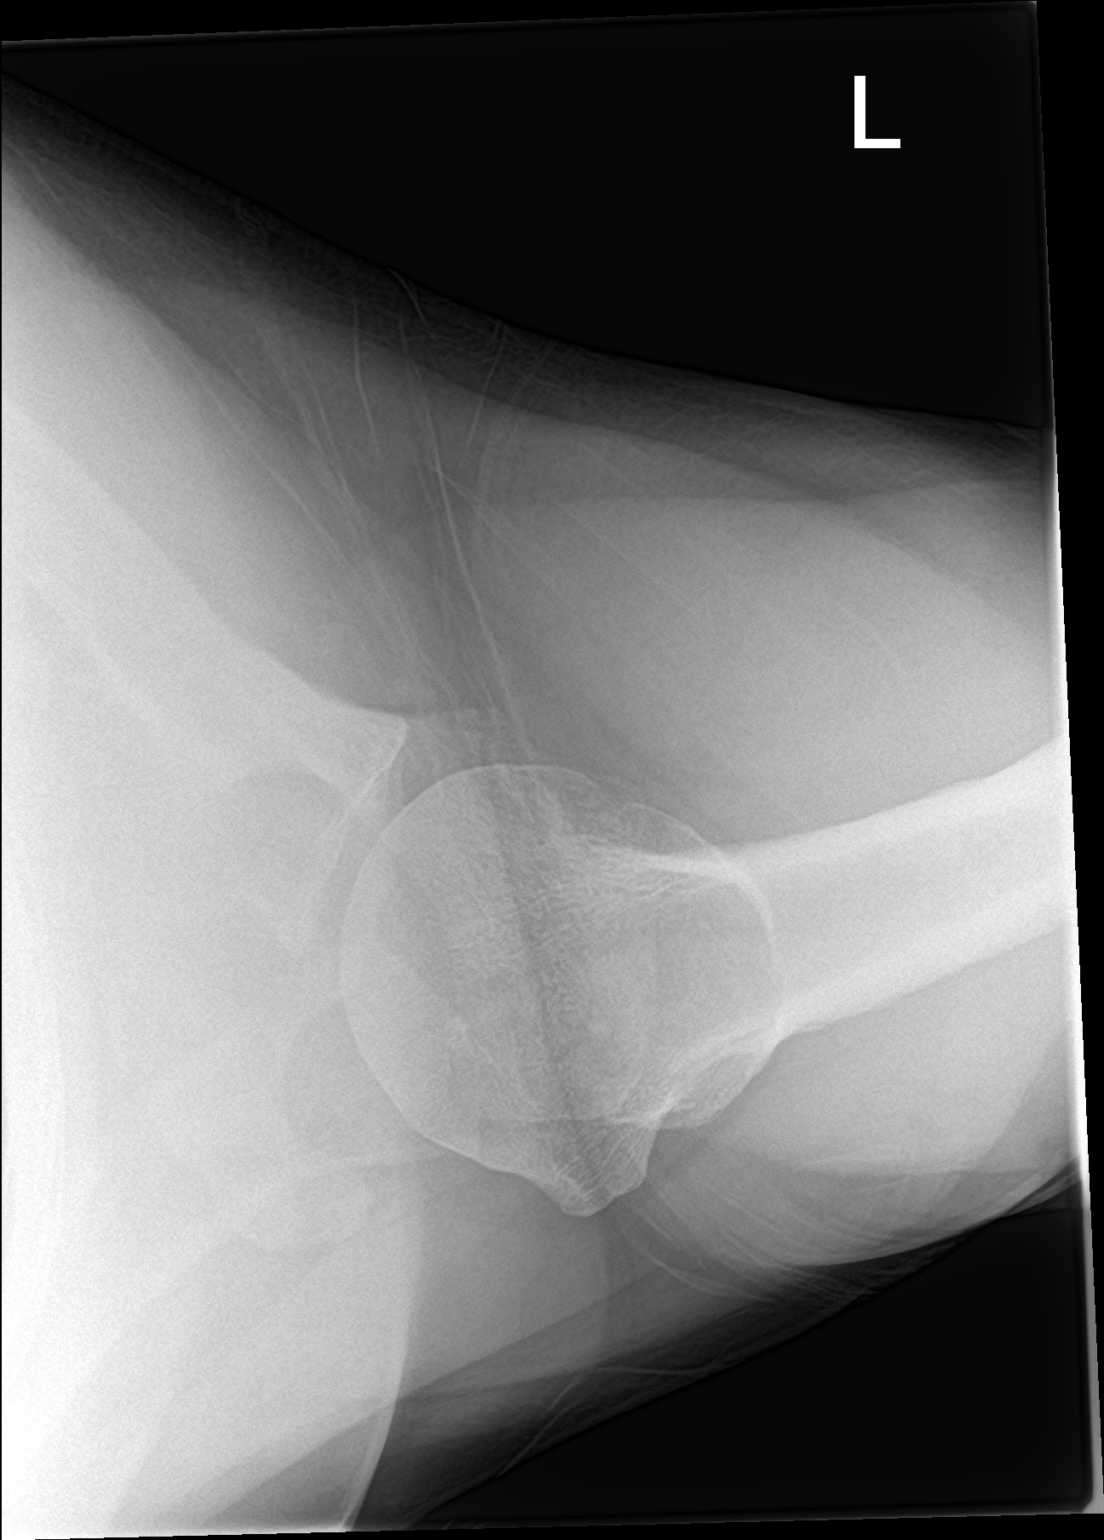

[3 of 3 positions shown; findings below may reference images not displayed]

FINDINGS: No acute fracture. No dislocation.  Unremarkable soft tissues.
IMPRESSION: No acute bony pathology

## 2019-02-25 ENCOUNTER — Ambulatory Visit (INDEPENDENT_AMBULATORY_CARE_PROVIDER_SITE_OTHER): Payer: BC Managed Care – PPO | Admitting: Family Medicine

## 2019-02-25 ENCOUNTER — Other Ambulatory Visit: Payer: Self-pay

## 2019-02-25 ENCOUNTER — Encounter: Payer: Self-pay | Admitting: Family Medicine

## 2019-02-25 VITALS — BP 112/80 | HR 96 | Temp 98.2°F | Ht 69.0 in | Wt 199.5 lb

## 2019-02-25 DIAGNOSIS — Z0000 Encounter for general adult medical examination without abnormal findings: Secondary | ICD-10-CM

## 2019-02-25 DIAGNOSIS — Z125 Encounter for screening for malignant neoplasm of prostate: Secondary | ICD-10-CM

## 2019-02-25 LAB — CBC
HCT: 39.5 % (ref 38.5–50.0)
Hemoglobin: 14.3 g/dL (ref 13.2–17.1)
MCH: 33.4 pg — ABNORMAL HIGH (ref 27.0–33.0)
MCHC: 36.2 g/dL — ABNORMAL HIGH (ref 32.0–36.0)
MCV: 92.3 fL (ref 80.0–100.0)
MPV: 12.8 fL — ABNORMAL HIGH (ref 7.5–12.5)
Platelets: 107 10*3/uL — ABNORMAL LOW (ref 140–400)
RBC: 4.28 10*6/uL (ref 4.20–5.80)
RDW: 13.2 % (ref 11.0–15.0)
WBC: 38.3 10*3/uL — ABNORMAL HIGH (ref 3.8–10.8)

## 2019-02-25 MED ORDER — TADALAFIL 20 MG PO TABS: 10.0000 mg | ORAL_TABLET | Freq: Every day | ORAL | 5 refills | Status: DC | PRN

## 2019-02-25 NOTE — Progress Notes (Signed)
Chief Complaint  Patient presents with  . Follow-up    Well Male Todd Novak is here for a complete physical.   His last physical was >1 year ago.  Current diet: in general, a "healthy" diet.  Current exercise:  Weight trend: lost a few lbs Daytime fatigue? No. Seat belt? Yes.    Health maintenance Shingrix- No  Colonoscopy- Yes Tetanus- Yes; 01/2010 HIV- Yes Hep C- Yes    Past Medical History:  Diagnosis Date  . Asthma    childhood, resolved  . CLL (chronic lymphocytic leukemia) ([place])   . Environmental allergies   . Fatigue   . GERD (gastroesophageal reflux disease)   . Heart murmur   . HTN (hypertension)       Past Surgical History:  Procedure Laterality Date  . APPENDECTOMY    . CHOLECYSTECTOMY    . TEE WITHOUT CARDIOVERSION N/A 05/06/2017   Procedure: TRANSESOPHAGEAL ECHOCARDIOGRAM (TEE);  Surgeon: Sueanne Margarita, MD;  Location: [facility] ENDOSCOPY;  Service: Cardiovascular;  Laterality: N/A;  . WISDOM TOOTH EXTRACTION      Medications  Current Outpatient Medications on File Prior to Visit  Medication Sig Dispense Refill  . acetaminophen (TYLENOL) 325 MG tablet Take 650 mg by mouth every 4 (four) hours as needed for mild pain, moderate pain or headache.     . albuterol (PROVENTIL HFA;VENTOLIN HFA) 108 (90 Base) MCG/ACT inhaler Inhale 2 puffs into the lungs every 6 (six) hours as needed for wheezing or shortness of breath. 1 Inhaler 1  . Ascorbic Acid (VITAMIN C) 100 MG tablet Take 100 mg by mouth daily.    Marland Kitchen azelastine (ASTELIN) 0.1 % nasal spray Place 2 sprays into both nostrils at bedtime as needed for rhinitis. Use in each nostril as directed 30 mL 3  . benzonatate (TESSALON) 200 MG capsule Take 1 capsule (200 mg total) by mouth 3 (three) times daily as needed for cough. 20 capsule 0  . doxycycline (VIBRA-TABS) 100 MG tablet Take 1 tablet (100 mg total) by mouth 2 (two) times daily. 15 tablet 0  . fexofenadine (ALLEGRA) 180 MG tablet Take 1 tablet (180 mg  total) by mouth daily. 30 tablet 5  . lisinopril (PRINIVIL,ZESTRIL) 20 MG tablet Take 1 tablet (20 mg total) by mouth daily. 90 tablet 2  . LYSINE PO Take 1 tablet by mouth daily.    . Multiple Vitamin (MULTIVITAMIN) tablet Take 1 tablet by mouth daily.    Marland Kitchen omeprazole (PRILOSEC) 40 MG capsule TAKE 1 CAPSULE(40 MG) BY MOUTH TWICE DAILY 60 capsule 11  . sildenafil (REVATIO) 20 MG tablet Take 1-2 tabs 30 minutes prior to sexual activity 50 tablet 0  . tadalafil (CIALIS) 5 MG tablet Take 1-4 tablets (5-20 mg total) by mouth daily as needed for erectile dysfunction. 30 tablet 0  . valACYclovir (VALTREX) 1000 MG tablet TAKE 2 TABLETS BY MOUTH AT ONSET OF COLD SORE, THEN 2 TABLETS 12 HOURS LATER 28 tablet 1  . vitamin E 400 UNIT capsule Take 400 Units by mouth daily.     Allergies Allergies  Allergen Reactions  . Codeine   . Methocarbamol Nausea Only    dizziness  . Tramadol Nausea And Vomiting    Family History Family History  Problem Relation Age of Onset  . Hypertension Mother        Living  . Hypertension Father 77       Deceased  . Hypertension Maternal Grandmother   . Hypertension Maternal Grandfather   . Healthy  Sister   . Healthy Son        x1  . Healthy Daughter        x2  . Cancer Neg Hx   . Diabetes Neg Hx   . Heart disease Neg Hx     Review of Systems: Constitutional:  no fevers Eye:  no recent significant change in vision Ear/Nose/Mouth/Throat:  Ears:  no hearing loss Nose/Mouth/Throat:  no complaints of nasal congestion, no sore throat Cardiovascular:  no chest pain, no palpitations Respiratory:  no cough and no shortness of breath Gastrointestinal:  no abdominal pain, no change in bowel habits GU:  Male: negative for dysuria, frequency, and incontinence and negative for prostate symptoms Musculoskeletal/Extremities:  no pain, redness, or swelling of the joints Integumentary (Skin/Breast):  no abnormal skin lesions reported Neurologic:  no  headaches Endocrine: No unexpected weight changes Hematologic/Lymphatic:  no abnormal bleeding  Exam BP 112/80 (BP Location: Left Arm, Patient Position: Sitting, Cuff Size: Normal)   Pulse 96   Temp 98.2 F (36.8 C) (Oral)   Ht 5\' 9"  (1.753 m)   Wt 199 lb 8 oz (90.5 kg)   SpO2 96%   BMI 29.46 kg/m  General:  well developed, well nourished, in no apparent distress Skin:  no significant moles, warts, or growths Head:  no masses, lesions, or tenderness Eyes:  pupils equal and round, sclera anicteric without injection Ears:  canals without lesions, TMs shiny without retraction, no obvious effusion, no erythema Nose:  nares patent, septum midline, mucosa normal Throat/Pharynx:  lips and gingiva without lesion; tongue and uvula midline; non-inflamed pharynx; no exudates or postnasal drainage Neck: neck supple without adenopathy, thyromegaly, or masses Lungs:  clear to auscultation, breath sounds equal bilaterally, no respiratory distress Cardio:  regular rate and rhythm, no LE edema, no bruits Abdomen:  abdomen soft, nontender; bowel sounds normal; no masses or organomegaly Rectal: Deferred Musculoskeletal:  symmetrical muscle groups noted without atrophy or deformity Extremities:  no clubbing, cyanosis, or edema, no deformities, no skin discoloration Neuro:  gait normal; deep tendon reflexes normal and symmetric Psych: well oriented with normal range of affect and appropriate judgment/insight  Assessment and Plan  Well adult exam - Plan: Comprehensive metabolic panel, Lipid panel  Screening for malignant neoplasm of prostate - Plan: PSA  Well 53 y.o. male. Counseled on diet and exercise. Counseled on getting Shingrix. Declines at this time.  Counseled on risks and benefits of prostate cancer screening with PSA. The patient agrees to undergo testing. Immunizations, labs, and further orders as above. Follow up in 6 mo or prn. The patient voiced understanding and agreement to the  plan.  [place], DO 02/25/19 3:09 PM

## 2019-02-25 NOTE — Patient Instructions (Addendum)
Give us 2-3 business days to get the results of your labs back.   Keep the diet clean and stay active.  Let us know if you need anything. 

## 2019-02-26 LAB — LIPID PANEL
Cholesterol: 223 mg/dL — ABNORMAL HIGH (ref <200)
HDL: 31 mg/dL — ABNORMAL LOW (ref >=40)
Non-HDL Cholesterol (Calc): 192 mg/dL (calc) — ABNORMAL HIGH (ref <130)
Total CHOL/HDL Ratio: 7.2 (calc) — ABNORMAL HIGH (ref <5.0)
Triglycerides: 478 mg/dL — ABNORMAL HIGH (ref <150)

## 2019-02-26 LAB — COMPREHENSIVE METABOLIC PANEL
AG Ratio: 2.4 (calc) (ref 1.0–2.5)
ALT: 24 U/L (ref 9–46)
AST: 24 U/L (ref 10–35)
Albumin: 4.6 g/dL (ref 3.6–5.1)
Alkaline phosphatase (APISO): 74 U/L (ref 35–144)
BUN: 16 mg/dL (ref 7–25)
CO2: 23 mmol/L (ref 20–32)
Calcium: 9.7 mg/dL (ref 8.6–10.3)
Chloride: 103 mmol/L (ref 98–110)
Creat: 1.11 mg/dL (ref 0.70–1.33)
Globulin: 1.9 g/dL (calc) (ref 1.9–3.7)
Glucose, Bld: 92 mg/dL (ref 65–99)
Potassium: 4.7 mmol/L (ref 3.5–5.3)
Sodium: 139 mmol/L (ref 135–146)
Total Bilirubin: 0.6 mg/dL (ref 0.2–1.2)
Total Protein: 6.5 g/dL (ref 6.1–8.1)

## 2019-02-26 LAB — PSA: PSA: 0.6 ng/mL (ref <=4.0)

## 2019-02-28 ENCOUNTER — Other Ambulatory Visit: Payer: Self-pay | Admitting: Family Medicine

## 2019-02-28 DIAGNOSIS — E781 Pure hyperglyceridemia: Secondary | ICD-10-CM

## 2019-03-26 DIAGNOSIS — R0602 Shortness of breath: Secondary | ICD-10-CM | POA: Diagnosis not present

## 2019-03-26 DIAGNOSIS — Z1159 Encounter for screening for other viral diseases: Secondary | ICD-10-CM | POA: Diagnosis not present

## 2019-03-26 DIAGNOSIS — J189 Pneumonia, unspecified organism: Secondary | ICD-10-CM | POA: Diagnosis not present

## 2019-03-31 ENCOUNTER — Encounter: Payer: Self-pay | Admitting: Internal Medicine

## 2019-03-31 ENCOUNTER — Other Ambulatory Visit: Payer: Self-pay

## 2019-03-31 ENCOUNTER — Telehealth: Payer: Self-pay | Admitting: Family Medicine

## 2019-03-31 ENCOUNTER — Ambulatory Visit (INDEPENDENT_AMBULATORY_CARE_PROVIDER_SITE_OTHER): Payer: BC Managed Care – PPO | Admitting: Internal Medicine

## 2019-03-31 DIAGNOSIS — J45990 Exercise induced bronchospasm: Secondary | ICD-10-CM

## 2019-03-31 DIAGNOSIS — J189 Pneumonia, unspecified organism: Secondary | ICD-10-CM | POA: Diagnosis not present

## 2019-03-31 MED ORDER — ALBUTEROL SULFATE HFA 108 (90 BASE) MCG/ACT IN AERS: 2.0000 | INHALATION_SPRAY | Freq: Four times a day (QID) | RESPIRATORY_TRACT | 1 refills | Status: DC | PRN

## 2019-03-31 MED ORDER — DOXYCYCLINE HYCLATE 100 MG PO TABS: 100.0000 mg | ORAL_TABLET | Freq: Two times a day (BID) | ORAL | 0 refills | Status: DC

## 2019-03-31 MED ORDER — LEVOFLOXACIN 500 MG PO TABS: 500.0000 mg | ORAL_TABLET | Freq: Every day | ORAL | 0 refills | Status: DC

## 2019-03-31 NOTE — Progress Notes (Signed)
Subjective:    Patient ID: Todd Novak, male    DOB: 04/03/1966, 53 y.o.   MRN: [id number]  DOS:  03/31/2019 Type of visit - description: Attempted  to make this a video visit, due to technical difficulties from the patient side it was not possible  thus we proceeded with a Virtual Visit via Telephone    I connected with@   by telephone and verified that I am speaking with the correct person using two identifiers.  THIS ENCOUNTER IS A VIRTUAL VISIT DUE TO COVID-19 - PATIENT WAS NOT SEEN IN THE OFFICE. PATIENT HAS CONSENTED TO VIRTUAL VISIT / TELEMEDICINE VISIT   Location of patient: home  Location of provider: office  I discussed the limitations, risks, security and privacy concerns of performing an evaluation and management service by telephone and the availability of in person appointments. I also discussed with the patient that there may be a patient responsible charge related to this service. The patient expressed understanding and agreed to proceed.   History of Present Illness: Acute Symptoms of started 6 to 7 days ago: Cough, increased wheezing from baseline, mild shortness of breath, diarrhea, dizziness, fatigue. Went to urgent care, vital signs were okay, O2 sat was normal. A chest x-ray showed pneumonia, he was prescribed a Z-Pak and Augmentin twice a day. COVID-19 testing was negative.  He is calling because he knows he is high risk due to CLL and in the past he had pneumonia and was admitted with sepsis. He is not improving, continue with shortness of breath cough although Delsym OTC is helping with it.  He is also using his inhalers..   Review of Systems No fever chills No chest pain but some chest tightness with breathing. No nausea or vomiting  Past Medical History:  Diagnosis Date  . Asthma    childhood, resolved  . CLL (chronic lymphocytic leukemia) ([place])   . Environmental allergies   . Fatigue   . GERD (gastroesophageal reflux disease)   . Heart  murmur   . HTN (hypertension)     Past Surgical History:  Procedure Laterality Date  . APPENDECTOMY    . CHOLECYSTECTOMY    . TEE WITHOUT CARDIOVERSION N/A 05/06/2017   Procedure: TRANSESOPHAGEAL ECHOCARDIOGRAM (TEE);  Surgeon: Sueanne Margarita, MD;  Location: [facility] ENDOSCOPY;  Service: Cardiovascular;  Laterality: N/A;  . WISDOM TOOTH EXTRACTION      Social History   Socioeconomic History  . Marital status: Married    Spouse name: Not on file  . Number of children: 3  . Years of education: Not on file  . Highest education level: Not on file  Occupational History  . Occupation: Press photographer   Social Needs  . Financial resource strain: Not on file  . Food insecurity    Worry: Not on file    Inability: Not on file  . Transportation needs    Medical: Not on file    Non-medical: Not on file  Tobacco Use  . Smoking status: Never Smoker  . Smokeless tobacco: Never Used  Substance and Sexual Activity  . Alcohol use: No    Alcohol/week: 3.0 standard drinks    Types: 3 Glasses of wine per week  . Drug use: No  . Sexual activity: Not on file  Lifestyle  . Physical activity    Days per week: Not on file    Minutes per session: Not on file  . Stress: Not on file  Relationships  . Social connections  Talks on phone: Not on file    Gets together: Not on file    Attends religious service: Not on file    Active member of club or organization: Not on file    Attends meetings of clubs or organizations: Not on file    Relationship status: Not on file  . Intimate partner violence    Fear of current or ex partner: Not on file    Emotionally abused: Not on file    Physically abused: Not on file    Forced sexual activity: Not on file  Other Topics Concern  . Not on file  Social History Narrative  . Not on file      Allergies as of 03/31/2019      Reactions   Codeine    Methocarbamol Nausea Only   dizziness   Tramadol Nausea And Vomiting      Medication List       Accurate as  of March 31, 2019 11:03 AM. If you have any questions, ask your nurse or doctor.        acetaminophen 325 MG tablet Commonly known as: TYLENOL Take 650 mg by mouth every 4 (four) hours as needed for mild pain, moderate pain or headache.   albuterol 108 (90 Base) MCG/ACT inhaler Commonly known as: VENTOLIN HFA Inhale 2 puffs into the lungs every 6 (six) hours as needed for wheezing or shortness of breath.   azelastine 0.1 % nasal spray Commonly known as: ASTELIN Place 2 sprays into both nostrils at bedtime as needed for rhinitis. Use in each nostril as directed   fexofenadine 180 MG tablet Commonly known as: ALLEGRA Take 1 tablet (180 mg total) by mouth daily.   lisinopril 20 MG tablet Commonly known as: ZESTRIL Take 1 tablet (20 mg total) by mouth daily.   LYSINE PO Take 1 tablet by mouth daily.   multivitamin tablet Take 1 tablet by mouth daily.   omeprazole 40 MG capsule Commonly known as: PRILOSEC TAKE 1 CAPSULE(40 MG) BY MOUTH TWICE DAILY   sildenafil 20 MG tablet Commonly known as: REVATIO Take 1-2 tabs 30 minutes prior to sexual activity   tadalafil 20 MG tablet Commonly known as: CIALIS Take 0.5-1 tablets (10-20 mg total) by mouth daily as needed for erectile dysfunction.   valACYclovir 1000 MG tablet Commonly known as: VALTREX TAKE 2 TABLETS BY MOUTH AT ONSET OF COLD SORE, THEN 2 TABLETS 12 HOURS LATER   vitamin C 100 MG tablet Take 100 mg by mouth daily.   vitamin E 400 UNIT capsule Take 400 Units by mouth daily.           Objective:   Physical Exam There were no vitals taken for this visit. Attempted to have video visit but we were unable to communicate.  This is a virtual phone visit.  He sounds alert oriented, speaking complete sentences, does not seem to be short of breath, mild cough noted.    Assessment     53 year old male,PMH includes asthma, CLL, GERD, HTN, heart murmur presents with  Pneumonia: Symptoms started approximately 6  to 7 days ago, was evaluated at urgent care approximately 4 days ago, DX with pneumonia. Was Rx antibiotics: finished  Zithromax, will finish Augmentin tomorrow Does not feel much better.  Over the phone he did not sound in distress. He is quite concerned about  ended up in the hospital again with sepsis as  in 2018. Patient aware that despite the negative test this could still be  COVID, continue with extreme quarantine precautions Plan: Start Doxycycline (addendum: intolerant, switch to levaquin) Fluids, Delsym, continue albuterol. Chest x-ray Patient will get the film from the urgent care x-ray for comparison and will get his records to be faxed to me. Most important, I emphasized the need to go to the ER if he has severe sxs Continue Covid quarantine, advised him not to go to work for 1 week   I discussed the assessment and treatment plan with the patient. The patient was provided an opportunity to ask questions and all were answered. The patient agreed with the plan and demonstrated an understanding of the instructions.   The patient was advised to call back or seek an in-person evaluation if the symptoms worsen or if the condition fails to improve as anticipated.  I provided 25 minutes of non-face-to-face time during this encounter.  Multiple questions answered  Kathlene November, MD

## 2019-03-31 NOTE — Telephone Encounter (Signed)
Pt just connected with Dr Larose Kells on virtual visit and Dr Larose Kells was to send in doxycycline (VIBRA-TABS) 100 MG tablet  Pt states he now remembers this makes him very nauseous. Pt wants to know if there is an alternative?   [id number] - [place], [place] - [address] AT [place] OF [address] [phone] (Phone) [phone] (Fax)

## 2019-03-31 NOTE — Telephone Encounter (Signed)
(  Normal renal function) Okay, please send Levaquin 500 mg 1 tablet daily, #5, no refills. Let him know that he will only need 5 days of treatment.

## 2019-03-31 NOTE — Telephone Encounter (Signed)
Please advise 

## 2019-03-31 NOTE — Telephone Encounter (Signed)
Spoke w/ Pt- informed Levaquin has been sent to pharmacy. Pt verbalized understanding.

## 2019-04-01 ENCOUNTER — Ambulatory Visit (HOSPITAL_BASED_OUTPATIENT_CLINIC_OR_DEPARTMENT_OTHER)
Admission: RE | Admit: 2019-04-01 | Discharge: 2019-04-01 | Disposition: A | Discharge status: Home / Self Care | Payer: BC Managed Care – PPO | Source: Ambulatory Visit | Attending: Internal Medicine | Admitting: Internal Medicine

## 2019-04-01 ENCOUNTER — Other Ambulatory Visit: Payer: Self-pay

## 2019-04-01 DIAGNOSIS — J189 Pneumonia, unspecified organism: Secondary | ICD-10-CM | POA: Insufficient documentation

## 2019-04-29 ENCOUNTER — Other Ambulatory Visit: Payer: Self-pay | Admitting: Family Medicine

## 2019-07-18 ENCOUNTER — Other Ambulatory Visit: Payer: Self-pay | Admitting: Family Medicine

## 2019-07-18 DIAGNOSIS — I10 Essential (primary) hypertension: Secondary | ICD-10-CM

## 2019-07-18 MED ORDER — LISINOPRIL 20 MG PO TABS: 20.0000 mg | ORAL_TABLET | Freq: Every day | ORAL | 1 refills | Status: DC

## 2019-08-26 ENCOUNTER — Ambulatory Visit: Payer: BC Managed Care – PPO | Admitting: Family Medicine

## 2019-11-05 ENCOUNTER — Other Ambulatory Visit: Payer: Self-pay | Admitting: Family Medicine

## 2019-12-21 ENCOUNTER — Other Ambulatory Visit: Payer: Self-pay | Admitting: Family Medicine

## 2019-12-21 DIAGNOSIS — I10 Essential (primary) hypertension: Secondary | ICD-10-CM

## 2019-12-21 MED ORDER — LISINOPRIL 20 MG PO TABS: 20.0000 mg | ORAL_TABLET | Freq: Every day | ORAL | 1 refills | Status: DC

## 2020-01-06 ENCOUNTER — Encounter: Payer: Self-pay | Admitting: Gastroenterology

## 2020-03-16 DIAGNOSIS — Z1152 Encounter for screening for COVID-19: Secondary | ICD-10-CM | POA: Diagnosis not present

## 2020-03-16 DIAGNOSIS — J309 Allergic rhinitis, unspecified: Secondary | ICD-10-CM | POA: Diagnosis not present

## 2020-10-17 ENCOUNTER — Encounter: Payer: Self-pay | Admitting: Family Medicine

## 2020-10-17 ENCOUNTER — Ambulatory Visit (INDEPENDENT_AMBULATORY_CARE_PROVIDER_SITE_OTHER): Payer: BC Managed Care – PPO | Admitting: Family Medicine

## 2020-10-17 ENCOUNTER — Telehealth: Payer: Self-pay | Admitting: *Deleted

## 2020-10-17 ENCOUNTER — Other Ambulatory Visit: Payer: Self-pay

## 2020-10-17 VITALS — BP 122/80 | HR 77 | Temp 98.1°F | Ht 69.0 in | Wt 201.1 lb

## 2020-10-17 DIAGNOSIS — F411 Generalized anxiety disorder: Secondary | ICD-10-CM

## 2020-10-17 DIAGNOSIS — I10 Essential (primary) hypertension: Secondary | ICD-10-CM

## 2020-10-17 DIAGNOSIS — K22719 Barrett's esophagus with dysplasia, unspecified: Secondary | ICD-10-CM

## 2020-10-17 DIAGNOSIS — B001 Herpesviral vesicular dermatitis: Secondary | ICD-10-CM

## 2020-10-17 DIAGNOSIS — C9110 Chronic lymphocytic leukemia of B-cell type not having achieved remission: Secondary | ICD-10-CM

## 2020-10-17 DIAGNOSIS — Z1211 Encounter for screening for malignant neoplasm of colon: Secondary | ICD-10-CM

## 2020-10-17 LAB — CBC WITH DIFFERENTIAL/PLATELET
Basophils Absolute: 0 10*3/uL (ref 0.0–0.1)
Basophils Relative: 0.1 % (ref 0.0–3.0)
Eosinophils Absolute: 0.1 10*3/uL (ref 0.0–0.7)
Eosinophils Relative: 0.3 % (ref 0.0–5.0)
HCT: 40.2 % (ref 39.0–52.0)
Hemoglobin: 13.4 g/dL (ref 13.0–17.0)
Lymphocytes Relative: 95.1 % — ABNORMAL HIGH (ref 12.0–46.0)
Lymphs Abs: 34 10*3/uL — ABNORMAL HIGH (ref 0.7–4.0)
MCHC: 33.5 g/dL (ref 30.0–36.0)
MCV: 95.8 fl (ref 78.0–100.0)
Monocytes Absolute: 0.4 10*3/uL (ref 0.1–1.0)
Monocytes Relative: 1 % — ABNORMAL LOW (ref 3.0–12.0)
Neutro Abs: 1.3 10*3/uL — ABNORMAL LOW (ref 1.4–7.7)
Neutrophils Relative %: 3.5 % — ABNORMAL LOW (ref 43.0–77.0)
Platelets: 73 10*3/uL — ABNORMAL LOW (ref 150.0–400.0)
RBC: 4.19 Mil/uL — ABNORMAL LOW (ref 4.22–5.81)
RDW: 14 % (ref 11.5–15.5)
WBC: 35.7 10*3/uL (ref 4.0–10.5)

## 2020-10-17 MED ORDER — VALACYCLOVIR HCL 1 G PO TABS: ORAL_TABLET | ORAL | 5 refills | Status: DC

## 2020-10-17 MED ORDER — SERTRALINE HCL 50 MG PO TABS: 50.0000 mg | ORAL_TABLET | Freq: Every day | ORAL | 3 refills | Status: DC

## 2020-10-17 MED ORDER — LISINOPRIL 20 MG PO TABS: 20.0000 mg | ORAL_TABLET | Freq: Every day | ORAL | 1 refills | Status: DC

## 2020-10-17 MED ORDER — OMEPRAZOLE 40 MG PO CPDR: DELAYED_RELEASE_CAPSULE | ORAL | 11 refills | Status: DC

## 2020-10-17 NOTE — Patient Instructions (Addendum)
Give [place] 2-3 business days to get the results of your labs back.   Keep the diet clean and stay active.  Please consider counseling. Contact [phone] to schedule an appointment or inquire about cost/insurance coverage.  If you do not hear anything about your referral in the next 1-2 weeks, call our office and ask for an update.  Coping skills Choose 5 that work for you:  Take a deep breath  Count to 20  Read a book  Do a puzzle  Meditate  Bake  Sing  Knit  Garden  Pray  Go outside  Call a friend  Listen to music  Take a walk  Color  Send a note  Take a bath  Watch a movie  Be alone in a quiet place  Pet an animal  Visit a friend  Journal  Exercise  Stretch

## 2020-10-17 NOTE — Progress Notes (Signed)
Chief Complaint  Patient presents with  . GI Problem    Cold sores    Subjective Todd Novak is an 55 y.o. male who presents with anxiety/depression Symptoms began several mo ago when the labor shortage began. Anxiety symptoms: difficulty concentrating, irritable, anxiousness. Depressive symptoms no anhedonia or depression. Family history significant for no psychiatric illness Social stressors include work.  No HI or SI ideation. No self medication w illicit drugs, will drink up to a pint per day on weekends. He is currently being treated with None and has failed None. He is not following with a psychologist.  CLL The patient has a history of CLL.  He follows with hematology at [facility].  He feels his lymph nodes/glands have been swelling.  He has not had his blood count checked in over a year.  Energy levels are fine, no excessive bruising or recurrent infections.  Weight is stable.  Barrett's esophagus Patient with a history of Barrett's esophagus, needs refill of omeprazole 40 mg twice daily.  No adverse effects.  Alcohol is exacerbating his reflux symptoms.  He does fine otherwise.  He is due to see a gastroenterologist.  Cold sores Patient has a history of recurrent cold sores.  Stress seems to exacerbate them.  He takes Valtrex 2 mg and repeat in 12 hours during outbreaks.  This works well and he reports no adverse effects with the medication.  Past Medical History:  Diagnosis Date  . Asthma    childhood, resolved  . Barrett's esophagus   . CLL (chronic lymphocytic leukemia) ([place])   . Environmental allergies   . Fatigue   . GERD (gastroesophageal reflux disease)   . Heart murmur   . HTN (hypertension)      Family History Family History  Problem Relation Age of Onset  . Hypertension Mother        Living  . Hypertension Father 29       Deceased  . Hypertension Maternal Grandmother   . Hypertension Maternal Grandfather   . Healthy Sister   . Healthy Son        x1   . Healthy Daughter        x2  . Cancer Neg Hx   . Diabetes Neg Hx   . Heart disease Neg Hx     Exam BP 122/80 (BP Location: Left Arm, Patient Position: Sitting, Cuff Size: Normal)   Pulse 77   Temp 98.1 F (36.7 C) (Oral)   Ht 5\' 9"  (1.753 m)   Wt 201 lb 2 oz (91.2 kg)   SpO2 99%   BMI 29.70 kg/m  General:  well developed, well nourished, in no apparent distress Heart: RRR, no lower extremity edema Lungs:  CTAB. Normal respiratory effort without accessory muscle use Abdomen: Bowel sounds present, soft, nontender, nondistended Lymph: Swollen submental and submandibular glands bilaterally and are all tender to palpation, enlarged lymph nodes in the cervical and submandibular region as well Psych: well oriented with normal range of affect and age-appropriate judgement/insight  Assessment and Plan  GAD (generalized anxiety disorder)  CLL (chronic lymphocytic leukemia) ([place]) - Plan: CBC w/Diff  Barrett's esophagus with dysplasia - Plan: Ambulatory referral to Gastroenterology  Recurrent cold sores - Plan: valACYclovir (VALTREX) 1000 MG tablet, DISCONTINUED: valACYclovir (VALTREX) 1000 MG tablet  Screen for colon cancer - Plan: Ambulatory referral to Gastroenterology  Essential hypertension - Plan: lisinopril (ZESTRIL) 20 MG tablet  1.  Start sertraline 25 mg daily for 2 weeks and then increase to  50 mg daily.  Counseled on adjunctive treatment with exercise/physical activity. Number for [place] provided in AVS. I will see him in 6 weeks to follow-up on this. 2.  I recommended he reach out to his hematologist again, will check CBC today. 3.  Continue omeprazole 40 mg twice daily.  Refer to gastroenterology for this and screening for colon cancer. 4.  Continue as needed Valtrex. Patient voiced understanding and agreement to the plan.  [place], DO 10/17/20 8:57 AM

## 2020-10-17 NOTE — Telephone Encounter (Signed)
CRITICAL VALUE STICKER  CRITICAL VALUE:  WBC 35.7  RECEIVER (on-site recipient of call): Kelle Darting, [place] NOTIFIED: 10/17/20 @ 11:18am  MESSENGER (representative from lab): Shari Prows.  MD NOTIFIED:  [place]: 11:21  RESPONSE:

## 2020-12-10 ENCOUNTER — Ambulatory Visit: Payer: BC Managed Care – PPO | Admitting: Gastroenterology

## 2021-01-22 ENCOUNTER — Other Ambulatory Visit: Payer: Self-pay | Admitting: Family Medicine

## 2021-01-22 DIAGNOSIS — I10 Essential (primary) hypertension: Secondary | ICD-10-CM

## 2021-02-22 ENCOUNTER — Other Ambulatory Visit: Payer: Self-pay | Admitting: Family Medicine

## 2021-02-22 ENCOUNTER — Other Ambulatory Visit: Payer: Self-pay

## 2021-02-22 ENCOUNTER — Telehealth: Payer: Self-pay | Admitting: *Deleted

## 2021-02-22 ENCOUNTER — Encounter: Payer: Self-pay | Admitting: Family Medicine

## 2021-02-22 ENCOUNTER — Ambulatory Visit (INDEPENDENT_AMBULATORY_CARE_PROVIDER_SITE_OTHER): Payer: No Typology Code available for payment source | Admitting: Family Medicine

## 2021-02-22 VITALS — BP 122/82 | HR 73 | Temp 98.3°F | Ht 69.0 in | Wt 202.4 lb

## 2021-02-22 DIAGNOSIS — I10 Essential (primary) hypertension: Secondary | ICD-10-CM | POA: Diagnosis not present

## 2021-02-22 DIAGNOSIS — R079 Chest pain, unspecified: Secondary | ICD-10-CM

## 2021-02-22 DIAGNOSIS — B001 Herpesviral vesicular dermatitis: Secondary | ICD-10-CM

## 2021-02-22 DIAGNOSIS — K22719 Barrett's esophagus with dysplasia, unspecified: Secondary | ICD-10-CM | POA: Diagnosis not present

## 2021-02-22 DIAGNOSIS — C9110 Chronic lymphocytic leukemia of B-cell type not having achieved remission: Secondary | ICD-10-CM | POA: Diagnosis not present

## 2021-02-22 DIAGNOSIS — F411 Generalized anxiety disorder: Secondary | ICD-10-CM | POA: Insufficient documentation

## 2021-02-22 LAB — CBC WITH DIFFERENTIAL/PLATELET
Basophils Absolute: 0 10*3/uL (ref 0.0–0.1)
Basophils Relative: 0.1 % (ref 0.0–3.0)
Eosinophils Absolute: 0.1 10*3/uL (ref 0.0–0.7)
Eosinophils Relative: 0.1 % (ref 0.0–5.0)
HCT: 40.2 % (ref 39.0–52.0)
Hemoglobin: 13.4 g/dL (ref 13.0–17.0)
Lymphocytes Relative: 93.4 % — ABNORMAL HIGH (ref 12.0–46.0)
Lymphs Abs: 34.3 10*3/uL — ABNORMAL HIGH (ref 0.7–4.0)
MCHC: 33.3 g/dL (ref 30.0–36.0)
MCV: 97.9 fl (ref 78.0–100.0)
Monocytes Absolute: 0.6 10*3/uL (ref 0.1–1.0)
Monocytes Relative: 1.6 % — ABNORMAL LOW (ref 3.0–12.0)
Neutro Abs: 1.8 10*3/uL (ref 1.4–7.7)
Neutrophils Relative %: 4.8 % — ABNORMAL LOW (ref 43.0–77.0)
Platelets: 70 10*3/uL — ABNORMAL LOW (ref 150.0–400.0)
RBC: 4.11 Mil/uL — ABNORMAL LOW (ref 4.22–5.81)
RDW: 14 % (ref 11.5–15.5)
WBC: 36.7 10*3/uL (ref 4.0–10.5)

## 2021-02-22 LAB — BASIC METABOLIC PANEL
BUN: 17 mg/dL (ref 6–23)
CO2: 26 mEq/L (ref 19–32)
Calcium: 9 mg/dL (ref 8.4–10.5)
Chloride: 107 mEq/L (ref 96–112)
Creatinine, Ser: 1.02 mg/dL (ref 0.40–1.50)
GFR: 82.56 mL/min (ref >60.00)
Glucose, Bld: 115 mg/dL — ABNORMAL HIGH (ref 70–99)
Potassium: 4.1 mEq/L (ref 3.5–5.1)
Sodium: 141 mEq/L (ref 135–145)

## 2021-02-22 MED ORDER — OMEPRAZOLE 40 MG PO CPDR: DELAYED_RELEASE_CAPSULE | ORAL | 2 refills | Status: DC

## 2021-02-22 MED ORDER — LISINOPRIL 20 MG PO TABS: ORAL_TABLET | ORAL | 2 refills | Status: DC

## 2021-02-22 MED ORDER — VALACYCLOVIR HCL 1 G PO TABS: ORAL_TABLET | ORAL | 5 refills | Status: DC

## 2021-02-22 NOTE — Progress Notes (Signed)
Chief Complaint  Patient presents with   Follow-up    Medication refills today Referral to oncologist    Subjective Todd Novak is a 55 y.o. male who presents for hypertension follow up. He does not monitor home blood pressures. He is compliant with medication- lis. Patient has these side effects of medication: none He is adhering to a healthy diet overall. Current exercise: walking +CP from Barrett's Esophagus. SOB from this also.  Did have an episode of exertional chest pain. Usually is random.   Barrett's Taking omeprazole 40 mg bid. No AE's. Having issues over past few mo. Has appt w GI in 4 d. No N/V/D, bleeding, fevers.   CLL Needs new referral to Heme/onc for ins. Starting to have fatigue, pain, swollen glands over past 8 mo.    Past Medical History:  Diagnosis Date   Asthma    childhood, resolved   Barrett's esophagus    CLL (chronic lymphocytic leukemia) ([facility])    Environmental allergies    Fatigue    GERD (gastroesophageal reflux disease)    Heart murmur    HTN (hypertension)     Exam BP 122/82   Pulse 73   Temp 98.3 F (36.8 C) (Oral)   Ht '5\' 9"'$  (1.753 m)   Wt 202 lb 6 oz (91.8 kg)   SpO2 98%   BMI 29.89 kg/m  General:  well developed, well nourished, in no apparent distress Lymph: Enlarged submandibular glands that are tender to palpation bilaterally Heart: RRR, no bruits, no LE edema Lungs: clear to auscultation, no accessory muscle use Abdomen: Epigastric tenderness to palpation, soft, and nondistended, bowel sounds present, no masses or organomegaly noted Psych: well oriented with normal range of affect and appropriate judgment/insight  Essential hypertension - Plan: lisinopril (ZESTRIL) 20 MG tablet, Basic metabolic panel, EKG [id number]  Barrett's esophagus with dysplasia  Recurrent cold sores - Plan: valACYclovir (VALTREX) 1000 MG tablet  CLL (chronic lymphocytic leukemia) ([place]) - Plan: Ambulatory referral to Hematology / Oncology, CBC  w/Diff  Exertional chest pain - Plan: Ambulatory referral to Cardiology  Chronic, stable. Counseled on diet and exercise.  Continue lisinopril 20 mg daily.  EKG shows LVH voltage criteria in V2 and V5.  Normal sinus rhythm.  No interval abnormalities.  T wave inversion in I. Refer to cards given episode of exertional chest pain and LVH.  Chronic. GI appt Tues. Cont omeprazole 40 mg bid for now.  Chronic, stable. Cont Valtrex as needed. Chronic. Place new referral to see Dr. Cruzita Lederer for insurance. Monitor CBC today.  F/u in 6 mo for CPE. The patient voiced understanding and agreement to the plan.  [place], DO 02/22/21  12:59 PM

## 2021-02-22 NOTE — Telephone Encounter (Signed)
CRITICAL VALUE STICKER  CRITICAL VALUE:  WBC  36.7  RECEIVER (on-site recipient of call): Kelle Darting, [place] NOTIFIED:  02/22/21 @ 4:01pm  MESSENGER (representative from lab): Shari Prows.  MD NOTIFIED: [place]: 4:02pm  RESPONSE:

## 2021-02-22 NOTE — Patient Instructions (Addendum)
Give [place] 2-3 business days to get the results of your labs back.   Keep the diet clean and stay active.  Add some weight resistance exercise.   If you do not hear anything about your referral in the next 1-2 weeks, call our office and ask for an update.  Let us know if you need anything.

## 2021-02-26 ENCOUNTER — Ambulatory Visit (INDEPENDENT_AMBULATORY_CARE_PROVIDER_SITE_OTHER): Payer: No Typology Code available for payment source | Admitting: Gastroenterology

## 2021-02-26 ENCOUNTER — Encounter: Payer: Self-pay | Admitting: Gastroenterology

## 2021-02-26 VITALS — BP 130/90 | HR 80 | Ht 67.75 in | Wt 203.1 lb

## 2021-02-26 DIAGNOSIS — Z8719 Personal history of other diseases of the digestive system: Secondary | ICD-10-CM

## 2021-02-26 DIAGNOSIS — K219 Gastro-esophageal reflux disease without esophagitis: Secondary | ICD-10-CM | POA: Diagnosis not present

## 2021-02-26 DIAGNOSIS — Z1211 Encounter for screening for malignant neoplasm of colon: Secondary | ICD-10-CM | POA: Diagnosis not present

## 2021-02-26 DIAGNOSIS — R079 Chest pain, unspecified: Secondary | ICD-10-CM | POA: Diagnosis not present

## 2021-02-26 MED ORDER — DICYCLOMINE HCL 10 MG PO CAPS: 10.0000 mg | ORAL_CAPSULE | Freq: Two times a day (BID) | ORAL | 0 refills | Status: DC

## 2021-02-26 MED ORDER — FAMOTIDINE 20 MG PO TABS: 20.0000 mg | ORAL_TABLET | Freq: Every day | ORAL | 3 refills | Status: DC

## 2021-02-26 MED ORDER — PLENVU 140 G PO SOLR: ORAL | 0 refills | Status: DC

## 2021-02-26 MED ORDER — OMEPRAZOLE 40 MG PO CPDR: 40.0000 mg | DELAYED_RELEASE_CAPSULE | Freq: Two times a day (BID) | ORAL | 3 refills | Status: DC

## 2021-02-26 NOTE — Progress Notes (Signed)
Todd Novak    [id number]    1966/06/27  Primary Care Physician:[place], Crosby Oyster, DO  Referring Physician: Shelda Pal, [address] [place],  [place] [address]   Chief complaint:  Barrett's esophagus  HPI:  55 yr-year-old very pleasant gentleman here with complaints of worsening heartburn, esophageal discomfort and spasms.  He is also having intermittent nausea.  He is losing weight, has severe fatigue. Denies vomiting, melena or abdominal pain.  He has history of chronic lymphocytic leukemia  Review of system positive for intermittent bright red blood per rectum mostly after a bowel movement, small-volume. Last colonoscopy in October 2011 at [facility], was normal exam other than mild changes consistent with ischemic colitis  EGD 01/26/2017 - The esophagus and gastroesophageal junction were examined with white light and narrow band imaging (NBI) from a forward view and retroflexed position. There were esophageal mucosal changes secondary to established short-segment Barrett's disease. These changes involved the mucosa at the upper extent of the gastric folds (40 cm from the incisors) extending to the Z-line (39 cm from the incisors). Three tongues of salmon-colored mucosa were present from 39 to 40 cm. The maximum longitudinal extent of these esophageal mucosal changes was 1 cm in length. Mucosa was biopsied with a cold forceps for histology in a targeted manner at intervals of 1 cm from 39 to 40 cm from the incisors. One specimen bottle was sent to pathology. - A few less than 5 mm sessile polyps with no stigmata of recent bleeding were found in the gastric fundus. - The cardia and gastric fundus were normal on retroflexion. - The examined duodenum was normal.   Outpatient Encounter Medications as of 02/26/2021  Medication Sig   acetaminophen (TYLENOL) 325 MG tablet Take 650 mg by mouth every 4 (four) hours as  needed for mild pain, moderate pain or headache.    albuterol (VENTOLIN HFA) 108 (90 Base) MCG/ACT inhaler Inhale 2 puffs into the lungs every 6 (six) hours as needed for wheezing or shortness of breath.   Ascorbic Acid (VITAMIN C) 100 MG tablet Take 100 mg by mouth daily.   azelastine (ASTELIN) 0.1 % nasal spray Place 2 sprays into both nostrils at bedtime as needed for rhinitis. Use in each nostril as directed   fexofenadine (ALLEGRA) 180 MG tablet Take 1 tablet (180 mg total) by mouth daily.   lisinopril (ZESTRIL) 20 MG tablet TAKE 1 TABLET(20 MG) BY MOUTH DAILY   LYSINE PO Take 1 tablet by mouth daily.   Multiple Vitamin (MULTIVITAMIN) tablet Take 1 tablet by mouth daily.   omeprazole (PRILOSEC) 40 MG capsule TAKE 1 CAPSULE(40 MG) BY MOUTH TWICE DAILY   sildenafil (REVATIO) 20 MG tablet Take 1-2 tabs 30 minutes prior to sexual activity   tadalafil (CIALIS) 20 MG tablet Take 0.5-1 tablets (10-20 mg total) by mouth daily as needed for erectile dysfunction.   valACYclovir (VALTREX) 1000 MG tablet TAKE 2 TABLETS BY MOUTH AT ONSET OF COLD SORE, THEN 2 TABLETS 12 HOURS LATER   vitamin E 400 UNIT capsule Take 400 Units by mouth daily.   No facility-administered encounter medications on file as of 02/26/2021.    Allergies as of 02/26/2021 - Review Complete 02/26/2021  Allergen Reaction Noted   Codeine  11/23/2017   Doxycycline Nausea Only 03/31/2019   Methocarbamol Nausea Only 05/09/2015   Tramadol Nausea And Vomiting 05/09/2015    Past Medical History:  Diagnosis Date   Asthma    childhood, resolved   Barrett's esophagus    CLL (chronic lymphocytic leukemia) ([facility])    Environmental allergies    Fatigue    GERD (gastroesophageal reflux disease)    Heart murmur    HTN (hypertension)     Past Surgical History:  Procedure Laterality Date   APPENDECTOMY     CHOLECYSTECTOMY     TEE WITHOUT CARDIOVERSION N/A 05/06/2017   Procedure: TRANSESOPHAGEAL ECHOCARDIOGRAM (TEE);  Surgeon: Sueanne Margarita, MD;  Location: [facility] ENDOSCOPY;  Service: Cardiovascular;  Laterality: N/A;   WISDOM TOOTH EXTRACTION      Family History  Problem Relation Age of Onset   Hypertension Mother        Living   Hypertension Father 72       Deceased   Healthy Sister    Hypertension Maternal Grandmother    Hypertension Maternal Grandfather    Healthy Daughter        x2   Healthy Son        x1   Cancer Neg Hx    Diabetes Neg Hx    Heart disease Neg Hx     Social History   Socioeconomic History   Marital status: Married    Spouse name: Not on file   Number of children: 3   Years of education: Not on file   Highest education level: Not on file  Occupational History   Occupation: Press photographer    Occupation: self employed  Tobacco Use   Smoking status: Some Days    Types: Cigars   Smokeless tobacco: Never  Vaping Use   Vaping Use: Never used  Substance and Sexual Activity   Alcohol use: Yes    Alcohol/week: 3.0 standard drinks    Types: 3 Glasses of wine per week    Comment: weekends   Drug use: Yes    Types: Marijuana    Comment: as a youngster   Sexual activity: Not on file  Other Topics Concern   Not on file  Social History Narrative   Not on file   Social Determinants of Health   Financial Resource Strain: Not on file  Food Insecurity: Not on file  Transportation Needs: Not on file  Physical Activity: Not on file  Stress: Not on file  Social Connections: Not on file  Intimate Partner Violence: Not on file      Review of systems: All other review of systems negative except as mentioned in the HPI.   Physical Exam: Vitals:   02/26/21 0833  BP: 130/90  Pulse: 80   Body mass index is 31.11 kg/m. Gen:      No acute distress HEENT:  sclera anicteric Abd:      soft, non-tender; no palpable masses, no distension Ext:    No edema Neuro: alert and oriented x 3 Psych: normal mood and affect  Data Reviewed:  Reviewed labs, radiology imaging, old records and  pertinent past GI work up   Assessment and Plan/Recommendations: 55 year old very pleasant gentleman with history of chronic GERD, Barrett's esophagus and chronic lymphocytic leukemia with complaints of worsening GERD symptoms and esophageal spasm  Is due for Barrett's surveillance exam: We will schedule for EGD for further evaluation of worsening GERD symptoms and surveillance of Barrett's esophagus GERD: Use omeprazole 40 mg before breakfast and dinner Pepcid at bedtime as needed Continue antireflux measures Use FD guard 1 capsule up to 3 times daily as needed for dyspepsia  Abdominal spasms:  Use Bentyl 10 mg twice daily as needed  He is due for colorectal cancer screening..  We will schedule colonoscopy along with EGD The risks and benefits as well as alternatives of endoscopic procedure(s) have been discussed and reviewed. All questions answered. The patient agrees to proceed.   Intermittent small-volume bright red blood per rectum likely secondary to bleeding from internal hemorrhoids  Given history of aortic stenosis and complaints of retrosternal discomfort/spasms, will request cardiac clearance to exclude significant CAD or worsening aortic stenosis prior to EGD and colonoscopy  The patient was provided an opportunity to ask questions and all were answered. The patient agreed with the plan and demonstrated an understanding of the instructions.  Damaris Hippo , MD    CC: Shelda Pal*

## 2021-02-26 NOTE — Patient Instructions (Addendum)
You have been scheduled for an endoscopy and colonoscopy. Please follow the written instructions given to you at your visit today. Please pick up your prep supplies at the pharmacy within the next 1-3 days. If you use inhalers (even only as needed), please bring them with you on the day of your procedure.   We have sent omeprazole, pepcid and dicyclomine to your pharmacy  FD Gard 1 capsule three times a day as needed  Follow up with PMD to exclude cardiac angina (Stress Test)  Due to recent changes in healthcare laws, you may see the results of your imaging and laboratory studies on MyChart before your provider has had a chance to review them.  We understand that in some cases there may be results that are confusing or concerning to you. Not all laboratory results come back in the same time frame and the provider may be waiting for multiple results in order to interpret others.  Please give [place] 48 hours in order for your provider to thoroughly review all the results before contacting the office for clarification of your results.    If you are age 4 or older, your body mass index should be between 23-30. Your Body mass index is 31.11 kg/m. If this is out of the aforementioned range listed, please consider follow up with your Primary Care Provider.  If you are age 44 or younger, your body mass index should be between 19-25. Your Body mass index is 31.11 kg/m. If this is out of the aformentioned range listed, please consider follow up with your Primary Care Provider.   __________________________________________________________  The [facility] providers would like to encourage you to use [facility] to communicate with providers for non-urgent requests or questions.  Due to long hold times on the telephone, sending your provider a message by [facility] may be a faster and more efficient way to get a response.  Please allow 48 business hours for a response.  Please remember that this is for non-urgent  requests.    Thank you for choosing [facility]  Karleen Hampshire Nandigam,MD

## 2021-02-28 ENCOUNTER — Encounter: Payer: Self-pay | Admitting: Gastroenterology

## 2021-03-25 DIAGNOSIS — Z9109 Other allergy status, other than to drugs and biological substances: Secondary | ICD-10-CM | POA: Insufficient documentation

## 2021-04-04 ENCOUNTER — Encounter: Payer: Self-pay | Admitting: Cardiology

## 2021-04-04 ENCOUNTER — Other Ambulatory Visit: Payer: Self-pay

## 2021-04-04 ENCOUNTER — Ambulatory Visit (INDEPENDENT_AMBULATORY_CARE_PROVIDER_SITE_OTHER): Payer: No Typology Code available for payment source

## 2021-04-04 ENCOUNTER — Ambulatory Visit (INDEPENDENT_AMBULATORY_CARE_PROVIDER_SITE_OTHER): Payer: No Typology Code available for payment source | Admitting: Cardiology

## 2021-04-04 VITALS — BP 122/88 | HR 98 | Ht 69.0 in | Wt 200.0 lb

## 2021-04-04 DIAGNOSIS — R002 Palpitations: Secondary | ICD-10-CM

## 2021-04-04 DIAGNOSIS — I340 Nonrheumatic mitral (valve) insufficiency: Secondary | ICD-10-CM

## 2021-04-04 DIAGNOSIS — I422 Other hypertrophic cardiomyopathy: Secondary | ICD-10-CM

## 2021-04-04 DIAGNOSIS — R072 Precordial pain: Secondary | ICD-10-CM | POA: Diagnosis not present

## 2021-04-04 DIAGNOSIS — E782 Mixed hyperlipidemia: Secondary | ICD-10-CM

## 2021-04-04 DIAGNOSIS — R0600 Dyspnea, unspecified: Secondary | ICD-10-CM | POA: Diagnosis not present

## 2021-04-04 DIAGNOSIS — R0609 Other forms of dyspnea: Secondary | ICD-10-CM

## 2021-04-04 DIAGNOSIS — I10 Essential (primary) hypertension: Secondary | ICD-10-CM

## 2021-04-04 DIAGNOSIS — R079 Chest pain, unspecified: Secondary | ICD-10-CM

## 2021-04-04 DIAGNOSIS — Z131 Encounter for screening for diabetes mellitus: Secondary | ICD-10-CM

## 2021-04-04 MED ORDER — METOPROLOL TARTRATE 100 MG PO TABS: ORAL_TABLET | ORAL | 0 refills | Status: DC

## 2021-04-04 MED ORDER — ROSUVASTATIN CALCIUM 10 MG PO TABS: 10.0000 mg | ORAL_TABLET | Freq: Every day | ORAL | 3 refills | Status: DC

## 2021-04-04 MED ORDER — CARVEDILOL 3.125 MG PO TABS: 3.1250 mg | ORAL_TABLET | Freq: Two times a day (BID) | ORAL | 3 refills | Status: DC

## 2021-04-04 NOTE — Patient Instructions (Addendum)
Medication Instructions:  Your physician has recommended you make the following change in your medication:  STOP: Lisinopril START: Coreg 3.125 mg twice daily START: Crestor 10 mg once daily *If you need a refill on your cardiac medications before your next appointment, please call your pharmacy*   Lab Work: Your physician recommends that you return for lab work in:  TODAY: Lipids, BMET, Mag, HbgA1C, LFTs If you have labs (blood work) drawn today and your tests are completely normal, you will receive your results only by: [place] (if you have MyChart) OR A paper copy in the mail If you have any lab test that is abnormal or we need to change your treatment, we will call you to review the results.   Testing/Procedures: Your physician has requested that you have an echocardiogram. Echocardiography is a painless test that uses sound waves to create images of your heart. It provides your doctor with information about the size and shape of your heart and how well your heart's chambers and valves are working. This procedure takes approximately one hour. There are no restrictions for this procedure.    Your cardiac CT will be scheduled at one of the below locations:   [facility] [address] [place], [place] [address] [phone]  If scheduled at [facility], please arrive at the [facility] main entrance (entrance A) of [facility] 30 minutes prior to test start time. Proceed to the [facility] Radiology Department (first floor) to check-in and test prep.   Please follow these instructions carefully (unless otherwise directed):  Hold all erectile dysfunction medications at least 3 days (72 hrs) prior to test.  On the Night Before the Test: Be sure to Drink plenty of water. Do not consume any caffeinated/decaffeinated beverages or chocolate 12 hours prior to your test. Do not take any antihistamines 12 hours prior to your test.  On the Day of  the Test: Drink plenty of water until 1 hour prior to the test. Do not eat any food 4 hours prior to the test. You may take your regular medications prior to the test (except Coreg 3.125 mg) Take metoprolol (Lopressor) two hours prior to test.      After the Test: Drink plenty of water. After receiving IV contrast, you may experience a mild flushed feeling. This is normal. On occasion, you may experience a mild rash up to 24 hours after the test. This is not dangerous. If this occurs, you can take Benadryl 25 mg and increase your fluid intake. If you experience trouble breathing, this can be serious. If it is severe call 911 IMMEDIATELY. If it is mild, please call our office.  Please allow 2-4 weeks for scheduling of routine cardiac CTs. Some insurance companies require a pre-authorization which may delay scheduling of this test.   For non-scheduling related questions, please contact the cardiac imaging nurse navigator should you have any questions/concerns: Marchia Bond, Cardiac Imaging Nurse Navigator Gordy Clement, Cardiac Imaging Nurse Navigator [facility] Direct Office Dial: [phone]   For scheduling needs, including cancellations and rescheduling, please call [place], [phone].   A zio monitor was ordered today. It will remain on for 14 days. You will then return monitor and event diary in provided box. It takes 1-2 weeks for report to be downloaded and returned to [place]. We will call you with the results. If monitor falls off or has orange flashing light, please call Zio for further instructions.  ZIO  WHY IS MY DOCTOR PRESCRIBING ZIO? The Zio system is proven and trusted by physicians to detect and diagnose irregular heart rhythms -- and has been prescribed to hundreds of thousands of patients.  The FDA has cleared the Zio system to monitor for many different kinds of irregular heart rhythms. In a study, physicians were able to reach a  diagnosis 90% of the time with the Zio system1.  You can wear the Zio monitor -- a small, discreet, comfortable patch -- during your normal day-to-day activity, including while you sleep, shower, and exercise, while it records every single heartbeat for analysis.  1Barrett, P., et al. Comparison of 24 Hour Holter Monitoring Versus 14 Day Novel Adhesive Patch Electrocardiographic Monitoring. [place], 2014.  ZIO VS. HOLTER MONITORING The Zio monitor can be comfortably worn for up to 14 days. Holter monitors can be worn for 24 to 48 hours, limiting the time to record any irregular heart rhythms you may have. Zio is able to capture data for the 51% of patients who have their first symptom-triggered arrhythmia after 48 hours.1  LIVE WITHOUT RESTRICTIONS The Zio ambulatory cardiac monitor is a small, unobtrusive, and water-resistant patch--you might even forget you're wearing it. The Zio monitor records and stores every beat of your heart, whether you're sleeping, working out, or showering. Remove on: Sept 22nd 2022   Follow-Up: At [facility], you and your health needs are our priority.  As part of our continuing mission to provide you with exceptional heart care, we have created designated Provider Care Teams.  These Care Teams include your primary Cardiologist (physician) and Advanced Practice Providers (APPs -  Physician Assistants and Nurse Practitioners) who all work together to provide you with the care you need, when you need it.  We recommend signing up for the patient portal called "MyChart".  Sign up information is provided on this After Visit Summary.  MyChart is used to connect with patients for Virtual Visits (Telemedicine).  Patients are able to view lab/test results, encounter notes, upcoming appointments, etc.  Non-urgent messages can be sent to your provider as well.   To learn more about what you can do with MyChart, go to [email or web address].    Your next  appointment:   6 week(s)  The format for your next appointment:   In Person  Provider:   Berniece Salines, DO [address], [place], [place] [address]    Other Instructions Echocardiogram An echocardiogram is a test that uses sound waves (ultrasound) to produce images of the heart. Images from an echocardiogram can provide important information about: Heart size and shape. The size and thickness and movement of your heart's walls. Heart muscle function and strength. Heart valve function or if you have stenosis. Stenosis is when the heart valves are too narrow. If blood is flowing backward through the heart valves (regurgitation). A tumor or infectious growth around the heart valves. Areas of heart muscle that are not working well because of poor blood flow or injury from a heart attack. Aneurysm detection. An aneurysm is a weak or damaged part of an artery wall. The wall bulges out from the normal force of blood pumping through the body. Tell a health care provider about: Any allergies you have. All medicines you are taking, including vitamins, herbs, eye drops, creams, and over-the-counter medicines. Any blood disorders you have. Any surgeries you have had. Any medical conditions you have. Whether you are pregnant or may be pregnant. What are the risks?  Generally, this is a safe test. However, problems may occur, including an allergic reaction to dye (contrast) that may be used during the test. What happens before the test? No specific preparation is needed. You may eat and drink normally. What happens during the test?  You will take off your clothes from the waist up and put on a hospital gown. Electrodes or electrocardiogram (ECG)patches may be placed on your chest. The electrodes or patches are then connected to a device that monitors your heart rate and rhythm. You will lie down on a table for an ultrasound exam. A gel will be applied to your chest to help sound waves pass  through your skin. A handheld device, called a transducer, will be pressed against your chest and moved over your heart. The transducer produces sound waves that travel to your heart and bounce back (or "echo" back) to the transducer. These sound waves will be captured in real-time and changed into images of your heart that can be viewed on a video monitor. The images will be recorded on a computer and reviewed by your health care provider. You may be asked to change positions or hold your breath for a short time. This makes it easier to get different views or better views of your heart. In some cases, you may receive contrast through an IV in one of your veins. This can improve the quality of the pictures from your heart. The procedure may vary among health care providers and hospitals. What can I expect after the test? You may return to your normal, everyday life, including diet, activities, and medicines, unless your health care provider tells you not to do that. Follow these instructions at home: It is up to you to get the results of your test. Ask your health care provider, or the department that is doing the test, when your results will be ready. Keep all follow-up visits. This is important. Summary An echocardiogram is a test that uses sound waves (ultrasound) to produce images of the heart. Images from an echocardiogram can provide important information about the size and shape of your heart, heart muscle function, heart valve function, and other possible heart problems. You do not need to do anything to prepare before this test. You may eat and drink normally. After the echocardiogram is completed, you may return to your normal, everyday life, unless your health care provider tells you not to do that. This information is not intended to replace advice given to you by your health care provider. Make sure you discuss any questions you have with your health care provider. Document Revised:  03/06/2020 Document Reviewed: 03/06/2020 Elsevier Patient Education  2022 [employer].

## 2021-04-04 NOTE — Progress Notes (Signed)
Cardiology Office Note:    Date:  04/04/2021   ID:  Todd Novak, DOB 1965-09-04, MRN [id number]  PCP:  Shelda Pal, DO  Cardiologist:  Berniece Salines, DO  Electrophysiologist:  None   Referring MD: Shelda Pal*   " I am experiencing shortness of breath."  History of Present Illness:    Todd Novak is a 55 y.o. male with a hx of hypertension, chronic lymphocytic leukemia, hyperlipidemia follows with oncology and was recently seen by his oncologist in August 2022, esophageal echocardiogram done in 2018 concerning for hypertrophic cardiomyopathy, PACs and on monitor, nuclear stress test done in 2016 with no evidence of ischemia.    He has not seen cardiology since that time.  Today he presents to be evaluated because he has been experiencing significant numbness with chest pain as well as shortness of breath.  He tells me that activity like mowing the lawn which were not a problem has now become issues for him.  He has intermittent chest discomfort on exertion.  He also tells me that he has these midsternal pains which he cannot differentiate usually as he has reflux disease and Barrett's esophagus.  But the shortness of breath continues to get worse.  Has not passed out.  Does admit to intermittent palpitations.  No other complaints at this time.   Past Medical History:  Diagnosis Date   Asthma    childhood, resolved   Barrett's esophagus    CLL (chronic lymphocytic leukemia) (HCC)    Environmental allergies    Fatigue    GERD (gastroesophageal reflux disease)    Heart murmur    HTN (hypertension)     Past Surgical History:  Procedure Laterality Date   APPENDECTOMY     CHOLECYSTECTOMY     TEE WITHOUT CARDIOVERSION N/A 05/06/2017   Procedure: TRANSESOPHAGEAL ECHOCARDIOGRAM (TEE);  Surgeon: Sueanne Margarita, MD;  Location: [facility] ENDOSCOPY;  Service: Cardiovascular;  Laterality: N/A;   WISDOM TOOTH EXTRACTION      Current Medications: Current Meds   Medication Sig   acetaminophen (TYLENOL) 325 MG tablet Take 650 mg by mouth every 4 (four) hours as needed for mild pain, moderate pain or headache.    Ascorbic Acid (VITAMIN C) 100 MG tablet Take 100 mg by mouth daily.   carvedilol (COREG) 3.125 MG tablet Take 1 tablet (3.125 mg total) by mouth 2 (two) times daily.   famotidine (PEPCID) 20 MG tablet Take 1 tablet (20 mg total) by mouth at bedtime.   fexofenadine (ALLEGRA) 180 MG tablet Take 1 tablet (180 mg total) by mouth daily.   LYSINE PO Take 1 tablet by mouth daily.   metoprolol tartrate (LOPRESSOR) 100 MG tablet Take 2 hours prior to CT   Multiple Vitamin (MULTIVITAMIN) tablet Take 1 tablet by mouth daily.   omeprazole (PRILOSEC) 40 MG capsule Take 1 capsule (40 mg total) by mouth in the morning and at bedtime.   rosuvastatin (CRESTOR) 10 MG tablet Take 1 tablet (10 mg total) by mouth daily.   valACYclovir (VALTREX) 1000 MG tablet TAKE 2 TABLETS BY MOUTH AT ONSET OF COLD SORE, THEN 2 TABLETS 12 HOURS LATER   vitamin E 400 UNIT capsule Take 400 Units by mouth daily.   [DISCONTINUED] lisinopril (ZESTRIL) 20 MG tablet TAKE 1 TABLET(20 MG) BY MOUTH DAILY     Allergies:   Codeine, Doxycycline, Methocarbamol, and Tramadol   Social History   Socioeconomic History   Marital status: Married    Spouse name: Not  on file   Number of children: 3   Years of education: Not on file   Highest education level: Not on file  Occupational History   Occupation: Sales    Occupation: self employed  Tobacco Use   Smoking status: Some Days    Types: Cigars   Smokeless tobacco: Never  Vaping Use   Vaping Use: Never used  Substance and Sexual Activity   Alcohol use: Yes    Alcohol/week: 3.0 standard drinks    Types: 3 Glasses of wine per week    Comment: weekends   Drug use: Yes    Types: Marijuana    Comment: as a youngster   Sexual activity: Not on file  Other Topics Concern   Not on file  Social History Narrative   Not on file    Social Determinants of Health   Financial Resource Strain: Not on file  Food Insecurity: Not on file  Transportation Needs: Not on file  Physical Activity: Not on file  Stress: Not on file  Social Connections: Not on file     Family History: The patient's family history includes Healthy in his daughter, sister, and son; Hypertension in his maternal grandfather, maternal grandmother, and mother; Hypertension (age of onset: 69) in his father. There is no history of Cancer, Diabetes, or Heart disease.  ROS:   Review of Systems  Constitution: Negative for decreased appetite, fever and weight gain.  HENT: Negative for congestion, ear discharge, hoarse voice and sore throat.   Eyes: Negative for discharge, redness, vision loss in right eye and visual halos.  Cardiovascular: Reports chest pain, dyspnea on exertion.negative for leg swelling, orthopnea and palpitations.  Respiratory: Negative for cough, hemoptysis, shortness of breath and snoring.   Endocrine: Negative for heat intolerance and polyphagia.  Hematologic/Lymphatic: Negative for bleeding problem. Does not bruise/bleed easily.  Skin: Negative for flushing, nail changes, rash and suspicious lesions.  Musculoskeletal: Negative for arthritis, joint pain, muscle cramps, myalgias, neck pain and stiffness.  Gastrointestinal: Negative for abdominal pain, bowel incontinence, diarrhea and excessive appetite.  Genitourinary: Negative for decreased libido, genital sores and incomplete emptying.  Neurological: Negative for brief paralysis, focal weakness, headaches and loss of balance.  Psychiatric/Behavioral: Negative for altered mental status, depression and suicidal ideas.  Allergic/Immunologic: Negative for HIV exposure and persistent infections.    EKGs/Labs/Other Studies Reviewed:    The following studies were reviewed today:   EKG:  The ekg ordered today demonstrates sinus rhythm, heart rate 78 bpm with evidence of T wave  inversions in the lateral leads as well as Q waves in the inferior leads.  Compared to EKG from 2018 no significant change.  Transthoracic echocardiogram May 05, 2017 Study Conclusions   - Left ventricle: The cavity size was normal. Systolic function was    normal. The estimated ejection fraction was in the range of 60%    to 65%. Wall motion was normal; there were no regional wall    motion abnormalities.  - Mitral valve: There was mild regurgitation.  - Left atrium: The atrium was mildly dilated.  - Pulmonary arteries: Systolic pressure could not be accurately    estimated.   -------------------------------------------------------------------  Study data:   Study status:  Routine.  Study completion:  There  were no complications.          Transthoracic echocardiography.  M-mode, complete 2D, spectral Doppler, and color Doppler.  Birthdate:  Patient birthdate: [birth year].  Age:  Patient is 54 yr  old.  Sex:  Gender: male.    BMI: 29.3 kg/m^2.  Blood pressure:  128/86  Patient status:  Inpatient.  Study date:  Study date:  05/05/2017. Study time: 01:50 PM.  Location:  Bedside.   -------------------------------------------------------------------   -------------------------------------------------------------------  Left ventricle:  The cavity size was normal. Systolic function was  normal. The estimated ejection fraction was in the range of 60% to  65%. Wall motion was normal; there were no regional wall motion  abnormalities.   -------------------------------------------------------------------  Aortic valve:   Trileaflet; moderately thickened, moderately  calcified leaflets. Mobility was not restricted.  Doppler:  Transvalvular velocity was within the normal range. There was no  stenosis. There was no regurgitation.   -------------------------------------------------------------------  Aorta:  Aortic root: The aortic root was normal in size.    -------------------------------------------------------------------  Mitral valve:   Structurally normal valve.   Mobility was not  restricted.  Doppler:  Transvalvular velocity was within the normal  range. There was no evidence for stenosis. There was mild  regurgitation.    Peak gradient (D): 3 mm Hg.   -------------------------------------------------------------------  Left atrium:  The atrium was mildly dilated.   -------------------------------------------------------------------  Right ventricle:  The cavity size was normal. Wall thickness was  normal. Systolic function was normal.   -------------------------------------------------------------------  Pulmonic valve:    Structurally normal valve.   Cusp separation was  normal.  Doppler:  Transvalvular velocity was within the normal  range. There was no evidence for stenosis. There was trivial  regurgitation.   -------------------------------------------------------------------  Tricuspid valve:   Structurally normal valve.    Doppler:  Transvalvular velocity was within the normal range. There was mild  regurgitation.   -------------------------------------------------------------------  Pulmonary artery:   The main pulmonary artery was normal-sized.  Systolic pressure could not be accurately estimated.   -------------------------------------------------------------------  Right atrium:  The atrium was normal in size.   -------------------------------------------------------------------  Pericardium:  There was no pericardial effusion.   -------------------------------------------------------------------  Systemic veins:  Inferior vena cava: The vessel was normal in size.    Nuclear stress test May 22, 2015 Nuclear stress EF: 64%. There was no ST segment deviation noted during stress. The study is normal. no ischemia. normal LV function This is a low risk study.    Recent Labs: 02/22/2021: BUN 17;  Creatinine, Ser 1.02; Hemoglobin 13.4; Platelets 70.0; Potassium 4.1; Sodium 141  Recent Lipid Panel    Component Value Date/Time   CHOL 223 (H) 02/25/2019 1505   TRIG 478 (H) 02/25/2019 1505   HDL 31 (L) 02/25/2019 1505   CHOLHDL 7.2 (H) 02/25/2019 1505   VLDL 43.6 (H) 03/25/2016 1136   [place]  02/25/2019 1505     Comment:     . LDL cholesterol not calculated. Triglyceride levels greater than 400 mg/dL invalidate calculated LDL results. . Reference range: <100 . Desirable range <100 mg/dL for primary prevention;   <70 mg/dL for patients with CHD or diabetic patients  with > or = 2 CHD risk factors. Marland Kitchen LDL-C is now calculated using the Martin-Hopkins  calculation, which is a validated novel method providing  better accuracy than the Friedewald equation in the  estimation of LDL-C.  Cresenciano Genre et al. Annamaria Helling. [id number]): 2061-2068  (http://education.Quest[employer].com/faq/FAQ164)    LDLDIRECT 138.0 03/25/2016 1136    Physical Exam:    VS:  BP 122/88 (BP Location: Left Arm, Patient Position: Sitting, Cuff Size: Normal)   Pulse 98   Ht '5\' 9"'$  (1.753 m)   Wt 200 lb 0.6 oz (90.7 kg)  SpO2 95%   BMI 29.54 kg/m     Wt Readings from Last 3 Encounters:  04/04/21 200 lb 0.6 oz (90.7 kg)  02/26/21 203 lb 2 oz (92.1 kg)  02/22/21 202 lb 6 oz (91.8 kg)     GEN: Well nourished, well developed in no acute distress HEENT: Normal NECK: No JVD; No carotid bruits LYMPHATICS: No lymphadenopathy CARDIAC: S1S2 noted,RRR, no murmurs, rubs, gallops RESPIRATORY:  Clear to auscultation without rales, wheezing or rhonchi  ABDOMEN: Soft, non-tender, non-distended, +bowel sounds, no guarding. EXTREMITIES: No edema, No cyanosis, no clubbing MUSCULOSKELETAL:  No deformity  SKIN: Warm and dry NEUROLOGIC:  Alert and oriented x 3, non-focal PSYCHIATRIC:  Normal affect, good insight  ASSESSMENT:    1. Hypertrophic cardiomyopathy ([place])   2. Nonrheumatic mitral valve regurgitation   3. DOE  (dyspnea on exertion)   4. Precordial pain   5. Essential hypertension   6. Mixed hyperlipidemia   7. Chest pain, unspecified type   8. Palpitations   9. Screening for diabetes mellitus    PLAN:    Still has shortness of breath which he tells me about could be multifactorial.  I will start with previous concern of hypertrophic cardiomyopathy which could if present with obstructive gradients which back in 2018 was 75 m mercury could be playing a role.  What I will like to do is stop the lisinopril and started patient on low-dose beta-blocker for medical therapy.  We will also help with his blood pressure.  In addition an echocardiogram will be done to reassess LV function, for resting gradients.  He may in the future benefit from MRI as well as possibly a stress echocardiogram.  I have also encouraged the patient to continue to hydrate when he is doing activity as well.  In terms of his risk factors for coronary artery disease he has had a nuclear stress test 2016 and this was normal.  He has risk factors and coronary artery disease would also be playing a role here.  It is appropriate to move forward with a coronary CTA in this patient-shared decision, he has no IV contrast dye allergies.  His blood pressure being monitored as I am transitioning patient off of lisinopril as described above and started him on it along 3.125 mg twice daily.  I reviewed his lipid profile which was done 2 years ago in July 2020 total cholesterol 223, HDL 31, triglyceride 478, non-HDL 192.  He really should be on moderate intensity statin we will start Crestor 10 mg daily today.  The patient understands the need to lose weight with diet and exercise. We have discussed specific strategies for this.  The patient is in agreement with the above plan. The patient left the office in stable condition.  The patient will follow up in   Medication Adjustments/Labs and Tests Ordered: Current medicines are reviewed at length  with the patient today.  Concerns regarding medicines are outlined above.  Orders Placed This Encounter  Procedures   CT CORONARY MORPH W/CTA COR W/SCORE W/CA W/CM &/OR WO/CM   Basic Metabolic Panel (BMET)   Magnesium   Lipid panel   Hemoglobin A1c   Hepatic function panel   LONG TERM MONITOR-LIVE TELEMETRY (3-14 DAYS)   ECHOCARDIOGRAM COMPLETE    Meds ordered this encounter  Medications   carvedilol (COREG) 3.125 MG tablet    Sig: Take 1 tablet (3.125 mg total) by mouth 2 (two) times daily.    Dispense:  180 tablet  Refill:  3   rosuvastatin (CRESTOR) 10 MG tablet    Sig: Take 1 tablet (10 mg total) by mouth daily.    Dispense:  90 tablet    Refill:  3   metoprolol tartrate (LOPRESSOR) 100 MG tablet    Sig: Take 2 hours prior to CT    Dispense:  1 tablet    Refill:  0     Patient Instructions  Medication Instructions:  Your physician has recommended you make the following change in your medication:  STOP: Lisinopril START: Coreg 3.125 mg twice daily START: Crestor 10 mg once daily *If you need a refill on your cardiac medications before your next appointment, please call your pharmacy*   Lab Work: Your physician recommends that you return for lab work in:  TODAY: Lipids, BMET, Mag, HbgA1C, LFTs If you have labs (blood work) drawn today and your tests are completely normal, you will receive your results only by: [place] (if you have MyChart) OR A paper copy in the mail If you have any lab test that is abnormal or we need to change your treatment, we will call you to review the results.   Testing/Procedures: Your physician has requested that you have an echocardiogram. Echocardiography is a painless test that uses sound waves to create images of your heart. It provides your doctor with information about the size and shape of your heart and how well your heart's chambers and valves are working. This procedure takes approximately one hour. There are no  restrictions for this procedure.    Your cardiac CT will be scheduled at one of the below locations:   [facility] [address] [place], [place] [address] [phone]  If scheduled at [facility], please arrive at the [facility] main entrance (entrance A) of [facility] 30 minutes prior to test start time. Proceed to the [facility] Radiology Department (first floor) to check-in and test prep.   Please follow these instructions carefully (unless otherwise directed):  Hold all erectile dysfunction medications at least 3 days (72 hrs) prior to test.  On the Night Before the Test: Be sure to Drink plenty of water. Do not consume any caffeinated/decaffeinated beverages or chocolate 12 hours prior to your test. Do not take any antihistamines 12 hours prior to your test.  On the Day of the Test: Drink plenty of water until 1 hour prior to the test. Do not eat any food 4 hours prior to the test. You may take your regular medications prior to the test (except Coreg 3.125 mg) Take metoprolol (Lopressor) two hours prior to test.      After the Test: Drink plenty of water. After receiving IV contrast, you may experience a mild flushed feeling. This is normal. On occasion, you may experience a mild rash up to 24 hours after the test. This is not dangerous. If this occurs, you can take Benadryl 25 mg and increase your fluid intake. If you experience trouble breathing, this can be serious. If it is severe call 911 IMMEDIATELY. If it is mild, please call our office.  Please allow 2-4 weeks for scheduling of routine cardiac CTs. Some insurance companies require a pre-authorization which may delay scheduling of this test.   For non-scheduling related questions, please contact the cardiac imaging nurse navigator should you have any questions/concerns: Marchia Bond, Cardiac Imaging Nurse Navigator Gordy Clement, Cardiac Imaging Nurse Navigator [facility] Direct Office Dial: [phone]  For scheduling needs, including cancellations and rescheduling, please call [place], [phone].   A zio monitor was ordered today. It will remain on for 14 days. You will then return monitor and event diary in provided box. It takes 1-2 weeks for report to be downloaded and returned to [place]. We will call you with the results. If monitor falls off or has orange flashing light, please call Zio for further instructions.   ZIO  WHY IS MY DOCTOR PRESCRIBING ZIO? The Zio system is proven and trusted by physicians to detect and diagnose irregular heart rhythms -- and has been prescribed to hundreds of thousands of patients.  The FDA has cleared the Zio system to monitor for many different kinds of irregular heart rhythms. In a study, physicians were able to reach a diagnosis 90% of the time with the Zio system1.  You can wear the Zio monitor -- a small, discreet, comfortable patch -- during your normal day-to-day activity, including while you sleep, shower, and exercise, while it records every single heartbeat for analysis.  1Barrett, P., et al. Comparison of 24 Hour Holter Monitoring Versus 14 Day Novel Adhesive Patch Electrocardiographic Monitoring. [place], 2014.  ZIO VS. HOLTER MONITORING The Zio monitor can be comfortably worn for up to 14 days. Holter monitors can be worn for 24 to 48 hours, limiting the time to record any irregular heart rhythms you may have. Zio is able to capture data for the 51% of patients who have their first symptom-triggered arrhythmia after 48 hours.1  LIVE WITHOUT RESTRICTIONS The Zio ambulatory cardiac monitor is a small, unobtrusive, and water-resistant patch--you might even forget you're wearing it. The Zio monitor records and stores every beat of your heart, whether you're sleeping, working out, or showering. Remove on: Sept 22nd 2022   Follow-Up: At [facility], you and your  health needs are our priority.  As part of our continuing mission to provide you with exceptional heart care, we have created designated Provider Care Teams.  These Care Teams include your primary Cardiologist (physician) and Advanced Practice Providers (APPs -  Physician Assistants and Nurse Practitioners) who all work together to provide you with the care you need, when you need it.  We recommend signing up for the patient portal called "MyChart".  Sign up information is provided on this After Visit Summary.  MyChart is used to connect with patients for Virtual Visits (Telemedicine).  Patients are able to view lab/test results, encounter notes, upcoming appointments, etc.  Non-urgent messages can be sent to your provider as well.   To learn more about what you can do with MyChart, go to [email or web address].    Your next appointment:   6 week(s)  The format for your next appointment:   In Person  Provider:   Berniece Salines, DO [address], [place], [place] [address]    Other Instructions Echocardiogram An echocardiogram is a test that uses sound waves (ultrasound) to produce images of the heart. Images from an echocardiogram can provide important information about: Heart size and shape. The size and thickness and movement of your heart's walls. Heart muscle function and strength. Heart valve function or if you have stenosis. Stenosis is when the heart valves are too narrow. If blood is flowing backward through the heart valves (regurgitation). A tumor or infectious growth around the heart valves. Areas of heart muscle that are not working well because of poor blood flow or injury from a heart attack. Aneurysm detection. An aneurysm is a  weak or damaged part of an artery wall. The wall bulges out from the normal force of blood pumping through the body. Tell a health care provider about: Any allergies you have. All medicines you are taking, including vitamins, herbs, eye drops,  creams, and over-the-counter medicines. Any blood disorders you have. Any surgeries you have had. Any medical conditions you have. Whether you are pregnant or may be pregnant. What are the risks? Generally, this is a safe test. However, problems may occur, including an allergic reaction to dye (contrast) that may be used during the test. What happens before the test? No specific preparation is needed. You may eat and drink normally. What happens during the test?  You will take off your clothes from the waist up and put on a hospital gown. Electrodes or electrocardiogram (ECG)patches may be placed on your chest. The electrodes or patches are then connected to a device that monitors your heart rate and rhythm. You will lie down on a table for an ultrasound exam. A gel will be applied to your chest to help sound waves pass through your skin. A handheld device, called a transducer, will be pressed against your chest and moved over your heart. The transducer produces sound waves that travel to your heart and bounce back (or "echo" back) to the transducer. These sound waves will be captured in real-time and changed into images of your heart that can be viewed on a video monitor. The images will be recorded on a computer and reviewed by your health care provider. You may be asked to change positions or hold your breath for a short time. This makes it easier to get different views or better views of your heart. In some cases, you may receive contrast through an IV in one of your veins. This can improve the quality of the pictures from your heart. The procedure may vary among health care providers and hospitals. What can I expect after the test? You may return to your normal, everyday life, including diet, activities, and medicines, unless your health care provider tells you not to do that. Follow these instructions at home: It is up to you to get the results of your test. Ask your health care provider,  or the department that is doing the test, when your results will be ready. Keep all follow-up visits. This is important. Summary An echocardiogram is a test that uses sound waves (ultrasound) to produce images of the heart. Images from an echocardiogram can provide important information about the size and shape of your heart, heart muscle function, heart valve function, and other possible heart problems. You do not need to do anything to prepare before this test. You may eat and drink normally. After the echocardiogram is completed, you may return to your normal, everyday life, unless your health care provider tells you not to do that. This information is not intended to replace advice given to you by your health care provider. Make sure you discuss any questions you have with your health care provider. Document Revised: 03/06/2020 Document Reviewed: 03/06/2020 Elsevier Patient Education  2022 [place].     Adopting a Healthy Lifestyle.  Know what a healthy weight is for you (roughly BMI <25) and aim to maintain this   Aim for 7+ servings of fruits and vegetables daily   65-80+ fluid ounces of water or unsweet tea for healthy kidneys   Limit to max 1 drink of alcohol per day; avoid smoking/tobacco   Limit animal fats in  diet for cholesterol and heart health - choose grass fed whenever available   Avoid highly processed foods, and foods high in saturated/trans fats   Aim for low stress - take time to unwind and care for your mental health   Aim for 150 min of moderate intensity exercise weekly for heart health, and weights twice weekly for bone health   Aim for 7-9 hours of sleep daily   When it comes to diets, agreement about the perfect plan isnt easy to find, even among the experts. Experts at the [place] [employer] developed an idea known as the Healthy Eating Plate. Just imagine a plate divided into logical, healthy portions.   The emphasis is on diet  quality:   Load up on vegetables and fruits - one-half of your plate: Aim for color and variety, and remember that potatoes dont count.   Go for whole grains - one-quarter of your plate: Whole wheat, barley, wheat berries, quinoa, oats, brown rice, and foods made with them. If you want pasta, go with whole wheat pasta.   Protein power - one-quarter of your plate: Fish, chicken, beans, and nuts are all healthy, versatile protein sources. Limit red meat.   The diet, however, does go beyond the plate, offering a few other suggestions.   Use healthy plant oils, such as olive, canola, soy, corn, sunflower and peanut. Check the labels, and avoid partially hydrogenated oil, which have unhealthy trans fats.   If youre thirsty, drink water. Coffee and tea are good in moderation, but skip sugary drinks and limit milk and dairy products to one or two daily servings.   The type of carbohydrate in the diet is more important than the amount. Some sources of carbohydrates, such as vegetables, fruits, whole grains, and beans-are healthier than others.   Finally, stay active  Signed, Berniece Salines, DO  04/04/2021 11:54 AM    [place]

## 2021-04-05 LAB — HEPATIC FUNCTION PANEL
ALT: 29 IU/L (ref 0–44)
AST: 31 IU/L (ref 0–40)
Albumin: 4.8 g/dL (ref 3.8–4.9)
Alkaline Phosphatase: 107 IU/L (ref 44–121)
Bilirubin Total: 0.3 mg/dL (ref 0.0–1.2)
Bilirubin, Direct: 0.12 mg/dL (ref 0.00–0.40)
Total Protein: 6.8 g/dL (ref 6.0–8.5)

## 2021-04-05 LAB — BASIC METABOLIC PANEL
BUN/Creatinine Ratio: 11 (ref 9–20)
BUN: 11 mg/dL (ref 6–24)
CO2: 22 mmol/L (ref 20–29)
Calcium: 9.3 mg/dL (ref 8.7–10.2)
Chloride: 105 mmol/L (ref 96–106)
Creatinine, Ser: 1.04 mg/dL (ref 0.76–1.27)
Glucose: 83 mg/dL (ref 65–99)
Potassium: 4.1 mmol/L (ref 3.5–5.2)
Sodium: 143 mmol/L (ref 134–144)
eGFR: 85 mL/min/{1.73_m2} (ref >59)

## 2021-04-05 LAB — LIPID PANEL
Chol/HDL Ratio: 5.7 ratio — ABNORMAL HIGH (ref 0.0–5.0)
Cholesterol, Total: 199 mg/dL (ref 100–199)
HDL: 35 mg/dL — ABNORMAL LOW (ref >39)
LDL Chol Calc (NIH): 128 mg/dL — ABNORMAL HIGH (ref 0–99)
Triglycerides: 200 mg/dL — ABNORMAL HIGH (ref 0–149)
VLDL Cholesterol Cal: 36 mg/dL (ref 5–40)

## 2021-04-05 LAB — MAGNESIUM: Magnesium: 2.1 mg/dL (ref 1.6–2.3)

## 2021-04-05 LAB — HEMOGLOBIN A1C
Est. average glucose Bld gHb Est-mCnc: 126 mg/dL
Hgb A1c MFr Bld: 6 % — ABNORMAL HIGH (ref 4.8–5.6)

## 2021-04-08 ENCOUNTER — Telehealth: Payer: Self-pay | Admitting: Cardiology

## 2021-04-08 NOTE — Telephone Encounter (Signed)
Called pt's wife. She states the pt was told he could take the monitor off after 3 days. They mailed it back today. Pt has upcoming testing on Friday.

## 2021-04-08 NOTE — Telephone Encounter (Signed)
[employer] is calling stating they are no longer receiving information in regards to this patient's monitor. She states they have failed to reach the patient in regards to it and are unsure if he is still wearing it. Reports they only have information through day 4 out of the 14 days requested.

## 2021-04-08 NOTE — Telephone Encounter (Signed)
Called to check on pt since the monitor is no longer reading,. No answer at this time, left a message for him to return the call.

## 2021-04-10 ENCOUNTER — Encounter (HOSPITAL_COMMUNITY): Admission: RE | Payer: Self-pay | Source: Home / Self Care

## 2021-04-10 ENCOUNTER — Ambulatory Visit (HOSPITAL_COMMUNITY)
Admission: RE | Admit: 2021-04-10 | Payer: No Typology Code available for payment source | Source: Home / Self Care | Admitting: Cardiology

## 2021-04-10 SURGERY — RIGHT/LEFT HEART CATH AND CORONARY ANGIOGRAPHY
Anesthesia: LOCAL

## 2021-04-11 ENCOUNTER — Encounter: Payer: No Typology Code available for payment source | Admitting: Gastroenterology

## 2021-04-11 ENCOUNTER — Telehealth: Payer: Self-pay | Admitting: *Deleted

## 2021-04-11 NOTE — Telephone Encounter (Signed)
Message was sent from Docia Chuck

## 2021-04-11 NOTE — Telephone Encounter (Signed)
===  View-only below this line=== ----- Message ----- From: Mauri Pole, MD Sent: 04/10/2021   3:44 PM EDT To: Osvaldo Angst, CRNA, Oda Kilts, CMA Subject: RE: [place] pt for tomorrow                        Shirlean Mylar, can you please advise patient to reschedule the procedure after he completes cardiac work up and has clearance from cardiology.  He was scheduled for cardiac cath today but was cancelled Thanks ----- Message ----- From: Osvaldo Angst, CRNA Sent: 04/10/2021   3:32 PM EDT To: Oda Kilts, CMA, Mauri Pole, MD Subject: [place] pt for tomorrow                            Dr. Silverio Decamp,   This pt is scheduled with you tomorrow.  We reviewed his chart 7 days ago.  I again reviewed his chart today prior to providing his care tomorrow.  I found after our initial  he saw his cardiologist with a compliant of SOB.  The cardiologist has ordered some testing which has not been done.  We will need a cardiac clearance in order to proceed tomorrow.  Thanks,  Osvaldo Angst  I left the office at 3:00 pm yesterday and just seen this message. I called the patient to inform him and Dr Silverio Decamp also called him over the phone. He understands he needs cardiac clearance before procedure.

## 2021-04-11 NOTE — Telephone Encounter (Signed)
Patient was not informed about the decision to cancel the procedure given he has not completed his cardiac work-up.  It was a late cancellation by anesthesia yesterday late afternoon and the message was not conveyed to patient in time.  I apologized profusely and informed him that we cannot proceed without completing cardiac work-up to ensure safety.  He was very understanding despite the difficulty and the trouble he had to go through the bowel prep.  We will request Dr. Ellyn Hack for cardiac clearance prior to rescheduling the procedure

## 2021-04-15 ENCOUNTER — Other Ambulatory Visit: Payer: Self-pay

## 2021-04-15 ENCOUNTER — Ambulatory Visit (HOSPITAL_BASED_OUTPATIENT_CLINIC_OR_DEPARTMENT_OTHER)
Admission: RE | Admit: 2021-04-15 | Discharge: 2021-04-15 | Disposition: A | Discharge status: Home / Self Care | Payer: No Typology Code available for payment source | Source: Ambulatory Visit | Attending: Cardiology | Admitting: Cardiology

## 2021-04-15 DIAGNOSIS — I422 Other hypertrophic cardiomyopathy: Secondary | ICD-10-CM | POA: Diagnosis present

## 2021-04-15 LAB — ECHOCARDIOGRAM COMPLETE
AR max vel: 3.42 cm2
AV Area VTI: 3.18 cm2
AV Area mean vel: 3.47 cm2
AV Mean grad: 4 mmHg
AV Peak grad: 7.6 mmHg
Ao pk vel: 1.38 m/s
Area-P 1/2: 2.6 cm2
Calc EF: 66 %
S' Lateral: 2.26 cm
Single Plane A2C EF: 63 %
Single Plane A4C EF: 70.7 %

## 2021-04-15 NOTE — Telephone Encounter (Signed)
He was scheduled to have a cardiac catheterization performed by me on the 14th, but this was canceled for reasons that I am not sure of.  He is being followed by Dr. Harriet Masson -I do not see that he has been rescheduled for cath yet.  Glenetta Hew, MD

## 2021-04-16 ENCOUNTER — Telehealth (HOSPITAL_COMMUNITY): Payer: Self-pay | Admitting: *Deleted

## 2021-04-16 ENCOUNTER — Telehealth: Payer: Self-pay

## 2021-04-16 NOTE — Telephone Encounter (Signed)
Reaching out to patient to offer assistance regarding upcoming cardiac imaging study; pt verbalizes understanding of appt date/time, parking situation and where to check in, pre-test NPO status and medications ordered, and verified current allergies; name and call back number provided for further questions should they arise ° °Tallia Moehring RN Navigator Cardiac Imaging °[facility] °[phone] office °[phone] cell  ° °Patient to take 100mg metoprolol tartrate two hours prior to cardiac CT scan. °

## 2021-04-16 NOTE — Telephone Encounter (Signed)
Spoke with patient regarding results and recommendation.  Patient verbalizes understanding and is agreeable to plan of care. Advised patient to call back with any issues or concerns.  

## 2021-04-16 NOTE — Telephone Encounter (Signed)
-----   Message from Berniece Salines, DO sent at 04/16/2021  1:35 PM EDT ----- Your echocardiogram does show evidence of hypertrophic cardiomyopathy.  We can discuss details at your follow-up visit.  I see that you have a coronary CTA scheduled for September 22 which is this week please keep this appointment.

## 2021-04-18 ENCOUNTER — Other Ambulatory Visit: Payer: Self-pay

## 2021-04-18 ENCOUNTER — Ambulatory Visit (HOSPITAL_COMMUNITY): Admission: RE | Admit: 2021-04-18 | Payer: No Typology Code available for payment source | Source: Ambulatory Visit

## 2021-04-18 ENCOUNTER — Ambulatory Visit (HOSPITAL_COMMUNITY)
Admission: RE | Admit: 2021-04-18 | Discharge: 2021-04-18 | Disposition: A | Discharge status: Home / Self Care | Payer: No Typology Code available for payment source | Source: Ambulatory Visit | Attending: Cardiology | Admitting: Cardiology

## 2021-04-18 DIAGNOSIS — R079 Chest pain, unspecified: Secondary | ICD-10-CM

## 2021-04-18 MED ORDER — IOHEXOL 350 MG/ML SOLN
100.0000 mL | Freq: Once | INTRAVENOUS | Status: AC | PRN
  Administered 2021-04-18: 100 mL via INTRAVENOUS

## 2021-04-18 MED ORDER — NITROGLYCERIN 0.4 MG SL SUBL
SUBLINGUAL_TABLET | SUBLINGUAL | Status: AC
  Filled 2021-04-18: qty 2

## 2021-04-18 MED ORDER — NITROGLYCERIN 0.4 MG SL SUBL
0.8000 mg | SUBLINGUAL_TABLET | Freq: Once | SUBLINGUAL | Status: AC
  Administered 2021-04-18: 0.8 mg via SUBLINGUAL

## 2021-05-01 ENCOUNTER — Telehealth: Payer: Self-pay | Admitting: Gastroenterology

## 2021-05-01 NOTE — Telephone Encounter (Signed)
Pt. Called to cancel office visit for tomorrow stating he had done the EKG and CT needed for cardiac clearance and now just needs to schedule his endo/colon.  Please advise.

## 2021-05-02 ENCOUNTER — Ambulatory Visit: Payer: No Typology Code available for payment source | Admitting: Gastroenterology

## 2021-05-02 ENCOUNTER — Telehealth: Payer: Self-pay

## 2021-05-02 NOTE — Telephone Encounter (Signed)
[facility] Pre-operative Risk Assessment     Request for surgical clearance:     Endoscopy Procedure  What type of surgery is being performed?     EGD and colonoscopy  When is this surgery scheduled?     Pending cardiac clearance  What type of clearance is required ?   Medical  Are there any medications that need to be held prior to surgery and how long? no  Practice name and name of physician performing surgery?      [facility] Dr Harl Bowie  What is your office phone and fax number?      Phone- [phone]  [phone]  Anesthesia type (None, local, MAC, general) ?       MAC

## 2021-05-02 NOTE — Telephone Encounter (Signed)
Please review

## 2021-05-02 NOTE — Telephone Encounter (Signed)
   Patient Name: Todd Novak  DOB: [birth year] MRN: [id number]  Primary Cardiologist: Berniece Salines, DO  Chart reviewed as part of pre-operative protocol coverage. Patient undergoing workup for hypertrophic cardiomyopathy by Dr. Harriet Masson. Also due for screening EGD for Barrett's esophagus and colonoscopy for colorectal screening. Echo concerning for HCM. Coronary CTA without evidence of CAD but did show LVH. Event monitor mentions both paroxysmal SVT and paroxysmal VT. He has follow-up scheduled on 05/16/21. I will route to Dr. Harriet Masson to determine if he may proceed with EGD/colonoscopy as requested or if clearance should be deferred until follow-up OV on 05/16/21. Dr. Harriet Masson - Please route response to P CV DIV PREOP (the pre-op pool). Thank you.  Todd Pitter, PA-C 05/02/2021, 11:40 AM

## 2021-05-02 NOTE — Telephone Encounter (Signed)
Please request clearance from cardiology and ok to schedule EGD and colonoscopy once cleared. Todd Novak, has a prep kit for bowel prep, he will need new instructions. Ok to cancel office visit. Thanks

## 2021-05-02 NOTE — Telephone Encounter (Signed)
Request for medical clearance routed to Cardiology.

## 2021-05-02 NOTE — Telephone Encounter (Signed)
Called the patient. We have the cardiac clearance from Cardiologist. Left a message for the patient offering 05/17/21 arriving at 1:30pm. Asked he call back to confirm. I will give him new instructions and a sample prep once he is scheduled.

## 2021-05-02 NOTE — Telephone Encounter (Signed)
   Patient Name: Todd Novak  DOB: 03-02-1966 MRN: [id number]  Primary Cardiologist: Berniece Salines, DO  Chart reviewed as part of pre-operative protocol coverage. I had reviewed clearance recommendation with Dr. Harriet Masson as he has been followed recently for diagnosis of hypertrophic cardiomyopathy. Per her reply, "Ok to proceed. I spoke with the patient at the time of his testing results. He was doing well on his new medication regimen." Will route this bundled recommendation to requesting provider via Epic fax function. Please call with questions.   Charlie Pitter, PA-C 05/02/2021, 1:52 PM

## 2021-05-08 NOTE — Telephone Encounter (Signed)
Spoke with the patient.  Conflict with his work schedule. Cancelled the colonoscopy until he can find another date.

## 2021-05-16 ENCOUNTER — Emergency Department (HOSPITAL_BASED_OUTPATIENT_CLINIC_OR_DEPARTMENT_OTHER): Payer: No Typology Code available for payment source

## 2021-05-16 ENCOUNTER — Encounter (HOSPITAL_BASED_OUTPATIENT_CLINIC_OR_DEPARTMENT_OTHER): Payer: Self-pay | Admitting: Emergency Medicine

## 2021-05-16 ENCOUNTER — Ambulatory Visit: Payer: No Typology Code available for payment source | Admitting: Cardiology

## 2021-05-16 ENCOUNTER — Emergency Department (HOSPITAL_BASED_OUTPATIENT_CLINIC_OR_DEPARTMENT_OTHER)
Admission: EM | Admit: 2021-05-16 | Discharge: 2021-05-16 | Disposition: A | Discharge status: Home / Self Care | Payer: No Typology Code available for payment source | Attending: Emergency Medicine | Admitting: Emergency Medicine

## 2021-05-16 DIAGNOSIS — I10 Essential (primary) hypertension: Secondary | ICD-10-CM | POA: Diagnosis not present

## 2021-05-16 DIAGNOSIS — C9110 Chronic lymphocytic leukemia of B-cell type not having achieved remission: Secondary | ICD-10-CM | POA: Diagnosis not present

## 2021-05-16 DIAGNOSIS — R509 Fever, unspecified: Secondary | ICD-10-CM

## 2021-05-16 DIAGNOSIS — R000 Tachycardia, unspecified: Secondary | ICD-10-CM | POA: Diagnosis not present

## 2021-05-16 DIAGNOSIS — J45909 Unspecified asthma, uncomplicated: Secondary | ICD-10-CM | POA: Diagnosis not present

## 2021-05-16 DIAGNOSIS — Z79899 Other long term (current) drug therapy: Secondary | ICD-10-CM | POA: Diagnosis not present

## 2021-05-16 DIAGNOSIS — Z20822 Contact with and (suspected) exposure to covid-19: Secondary | ICD-10-CM | POA: Diagnosis not present

## 2021-05-16 DIAGNOSIS — F17290 Nicotine dependence, other tobacco product, uncomplicated: Secondary | ICD-10-CM | POA: Diagnosis not present

## 2021-05-16 LAB — RESP PANEL BY RT-PCR (FLU A&B, COVID) ARPGX2
Influenza A by PCR: NEGATIVE
Influenza B by PCR: NEGATIVE
SARS Coronavirus 2 by RT PCR: NEGATIVE

## 2021-05-16 LAB — CBC WITH DIFFERENTIAL/PLATELET
Abs Immature Granulocytes: 0.05 10*3/uL (ref 0.00–0.07)
Basophils Absolute: 0.1 10*3/uL (ref 0.0–0.1)
Basophils Relative: 0 %
Eosinophils Absolute: 0 10*3/uL (ref 0.0–0.5)
Eosinophils Relative: 0 %
HCT: 39.1 % (ref 39.0–52.0)
Hemoglobin: 13.8 g/dL (ref 13.0–17.0)
Immature Granulocytes: 0 %
Lymphocytes Relative: 85 %
Lymphs Abs: 34 10*3/uL — ABNORMAL HIGH (ref 0.7–4.0)
MCH: 32.9 pg (ref 26.0–34.0)
MCHC: 35.3 g/dL (ref 30.0–36.0)
MCV: 93.1 fL (ref 80.0–100.0)
Monocytes Absolute: 2.6 10*3/uL — ABNORMAL HIGH (ref 0.1–1.0)
Monocytes Relative: 7 %
Neutro Abs: 3 10*3/uL (ref 1.7–7.7)
Neutrophils Relative %: 8 %
Platelets: 80 10*3/uL — ABNORMAL LOW (ref 150–400)
RBC: 4.2 MIL/uL — ABNORMAL LOW (ref 4.22–5.81)
RDW: 13.2 % (ref 11.5–15.5)
Smear Review: DECREASED
WBC: 39.7 10*3/uL — ABNORMAL HIGH (ref 4.0–10.5)
nRBC: 0 % (ref 0.0–0.2)

## 2021-05-16 LAB — URINALYSIS, ROUTINE W REFLEX MICROSCOPIC
Bilirubin Urine: NEGATIVE
Glucose, UA: NEGATIVE mg/dL
Ketones, ur: NEGATIVE mg/dL
Leukocytes,Ua: NEGATIVE
Nitrite: NEGATIVE
Protein, ur: NEGATIVE mg/dL
Specific Gravity, Urine: 1.02 (ref 1.005–1.030)
pH: 6.5 (ref 5.0–8.0)

## 2021-05-16 LAB — COMPREHENSIVE METABOLIC PANEL
ALT: 28 U/L (ref 0–44)
AST: 25 U/L (ref 15–41)
Albumin: 4.5 g/dL (ref 3.5–5.0)
Alkaline Phosphatase: 98 U/L (ref 38–126)
Anion gap: 9 (ref 5–15)
BUN: 13 mg/dL (ref 6–20)
CO2: 24 mmol/L (ref 22–32)
Calcium: 9.2 mg/dL (ref 8.9–10.3)
Chloride: 100 mmol/L (ref 98–111)
Creatinine, Ser: 1.04 mg/dL (ref 0.61–1.24)
GFR, Estimated: >60 mL/min (ref >60)
Glucose, Bld: 132 mg/dL — ABNORMAL HIGH (ref 70–99)
Potassium: 4.4 mmol/L (ref 3.5–5.1)
Sodium: 133 mmol/L — ABNORMAL LOW (ref 135–145)
Total Bilirubin: 0.9 mg/dL (ref 0.3–1.2)
Total Protein: 7.3 g/dL (ref 6.5–8.1)

## 2021-05-16 LAB — URINALYSIS, MICROSCOPIC (REFLEX): WBC, UA: NONE SEEN WBC/hpf (ref 0–5)

## 2021-05-16 LAB — LACTIC ACID, PLASMA: Lactic Acid, Venous: 0.9 mmol/L (ref 0.5–1.9)

## 2021-05-16 MED ORDER — ACETAMINOPHEN 500 MG PO TABS
1000.0000 mg | ORAL_TABLET | Freq: Once | ORAL | Status: AC
  Administered 2021-05-16: 1000 mg via ORAL
  Filled 2021-05-16: qty 2

## 2021-05-16 MED ORDER — ONDANSETRON HCL 4 MG/2ML IJ SOLN
4.0000 mg | Freq: Once | INTRAMUSCULAR | Status: AC
  Administered 2021-05-16: 4 mg via INTRAVENOUS
  Filled 2021-05-16: qty 2

## 2021-05-16 MED ORDER — LACTATED RINGERS IV BOLUS
1000.0000 mL | Freq: Once | INTRAVENOUS | Status: AC
  Administered 2021-05-16: 1000 mL via INTRAVENOUS

## 2021-05-16 NOTE — Discharge Instructions (Addendum)
Cultures have been sent.  You will be notified if they show infection.  Try and keep yourself hydrated.  Follow-up with your doctor as needed.

## 2021-05-16 NOTE — ED Notes (Addendum)
Assumed care from [place]. Patient laying quietly on gurney. Patient updated on plan of care. Patient states nausea has subsided - given PO tylenol. Will continue to monitor. Patient calm/cooperative.

## 2021-05-16 NOTE — ED Provider Notes (Signed)
[facility] Provider Note   CSN: [id number] Arrival date & time: 05/16/21  2043     History Chief Complaint  Patient presents with   Fever   Joint Pain    Todd Novak is a 54 y.o. male.   Fever Associated symptoms: headaches and nausea   Associated symptoms: no chest pain, no congestion and no rash   Patient presents with fever myalgias.  Has had for 3 days now.  Aches and pains.  Denies any sick contacts.  States joints hurt particularly.  States this is similar to when he had a sepsis back in 2018.  Has a history of CLL.  States his white count had gone from 30 up to 50 when it was most recently checked.  No nausea or vomiting.  No diarrhea.  No dysuria.  No cough.  No real abdominal pain.  States that nodes on his neck are more swollen.  States these come and go but this seems larger even for what he tends to get.  Gets seen at [facility] for his CLL.    Past Medical History:  Diagnosis Date   Asthma    childhood, resolved   Barrett's esophagus    CLL (chronic lymphocytic leukemia) ([place])    Environmental allergies    Fatigue    GERD (gastroesophageal reflux disease)    Heart murmur    HTN (hypertension)     Patient Active Problem List   Diagnosis Date Noted   Environmental allergies 03/25/2021   GAD (generalized anxiety disorder) 02/22/2021   Barrett's esophagus with dysplasia 10/17/2020   Exercise-induced asthma 08/23/2018   Recurrent cold sores 08/23/2018   Erectile dysfunction 08/23/2018   Bell's palsy 11/23/2017   Vaccine counseling 05/25/2017   Acute pain of left shoulder 05/04/2017   Sepsis due to Streptococcus agalactiae ([place]) 05/03/2017   History of Barrett's esophagus 01/20/2017   Sebaceous cyst 03/25/2016   Palpitations 05/09/2015   Aortic stenosis 05/09/2015   CLL (chronic lymphocytic leukemia) ([place]) 04/21/2015   Abnormal finding on EKG 04/21/2015   Degeneration of intervertebral disc of lumbar region 08/22/2014    Bilateral lumbar radiculopathy 06/21/2014   Abdominal pain, chronic, epigastric 11/28/2013   Fatigue 11/28/2013   Essential hypertension 11/28/2013   GERD (gastroesophageal reflux disease) 11/28/2013    Past Surgical History:  Procedure Laterality Date   APPENDECTOMY     CHOLECYSTECTOMY     TEE WITHOUT CARDIOVERSION N/A 05/06/2017   Procedure: TRANSESOPHAGEAL ECHOCARDIOGRAM (TEE);  Surgeon: Sueanne Margarita, MD;  Location: [facility] ENDOSCOPY;  Service: Cardiovascular;  Laterality: N/A;   WISDOM TOOTH EXTRACTION         Family History  Problem Relation Age of Onset   Hypertension Mother        Living   Hypertension Father 40       Deceased   Healthy Sister    Hypertension Maternal Grandmother    Hypertension Maternal Grandfather    Healthy Daughter        x2   Healthy Son        x1   Cancer Neg Hx    Diabetes Neg Hx    Heart disease Neg Hx     Social History   Tobacco Use   Smoking status: Some Days    Types: Cigars   Smokeless tobacco: Never  Vaping Use   Vaping Use: Never used  Substance Use Topics   Alcohol use: Yes    Alcohol/week: 3.0 standard drinks  Types: 3 Glasses of wine per week    Comment: weekends   Drug use: Not Currently    Types: Marijuana    Comment: as a youngster    Home Medications Prior to Admission medications   Medication Sig Start Date End Date Taking? Authorizing Provider  acetaminophen (TYLENOL) 325 MG tablet Take 650 mg by mouth every 4 (four) hours as needed for mild pain, moderate pain or headache.     [provider]  Ascorbic Acid (VITAMIN C) 100 MG tablet Take 100 mg by mouth daily.    [provider]  carvedilol (COREG) 3.125 MG tablet Take 1 tablet (3.125 mg total) by mouth 2 (two) times daily. 04/04/21 07/03/21  Tobb, Kardie, DO  famotidine (PEPCID) 20 MG tablet Take 1 tablet (20 mg total) by mouth at bedtime. 02/26/21   Mauri Pole, MD  fexofenadine (ALLEGRA) 180 MG tablet Take 1 tablet (180 mg total) by  mouth daily. 05/30/16   Debbrah Alar, NP  LYSINE PO Take 1 tablet by mouth daily.    [provider]  metoprolol tartrate (LOPRESSOR) 100 MG tablet Take 2 hours prior to CT 04/04/21   Tobb, Kardie, DO  Multiple Vitamin (MULTIVITAMIN) tablet Take 1 tablet by mouth daily.    [provider]  omeprazole (PRILOSEC) 40 MG capsule Take 1 capsule (40 mg total) by mouth in the morning and at bedtime. 02/26/21   Mauri Pole, MD  rosuvastatin (CRESTOR) 10 MG tablet Take 1 tablet (10 mg total) by mouth daily. 04/04/21 07/03/21  Tobb, Kardie, DO  valACYclovir (VALTREX) 1000 MG tablet TAKE 2 TABLETS BY MOUTH AT ONSET OF COLD SORE, THEN 2 TABLETS 12 HOURS LATER 02/22/21   Nani Ravens, Crosby Oyster, DO  vitamin E 400 UNIT capsule Take 400 Units by mouth daily.    [provider]    Allergies    Codeine, Doxycycline, Methocarbamol, and Tramadol  Review of Systems   Review of Systems  Constitutional:  Positive for fever.  HENT:  Negative for congestion.   Eyes:  Positive for photophobia.  Respiratory:  Negative for shortness of breath.   Cardiovascular:  Negative for chest pain.  Gastrointestinal:  Positive for nausea. Negative for abdominal distention and abdominal pain.  Genitourinary:  Negative for flank pain.  Musculoskeletal:  Positive for arthralgias.  Skin:  Negative for rash.  Neurological:  Positive for headaches. Negative for weakness.  Psychiatric/Behavioral:  Negative for behavioral problems.    Physical Exam Updated Vital Signs BP 120/77   Pulse 97   Temp 99.3 F (37.4 C) (Oral)   Resp 18   Ht 5\' 9"  (1.753 m)   Wt 89.5 kg   SpO2 98%   BMI 29.14 kg/m   Physical Exam Vitals and nursing note reviewed.  HENT:     Head: Normocephalic.     Mouth/Throat:     Mouth: Mucous membranes are moist.  Eyes:     Pupils: Pupils are equal, round, and reactive to light.  Neck:     Comments: Anterior cervical lymphadenopathy. Cardiovascular:     Rate and  Rhythm: Regular rhythm. Tachycardia present.  Pulmonary:     Breath sounds: No wheezing or rhonchi.  Abdominal:     Tenderness: There is no abdominal tenderness.  Musculoskeletal:        General: Tenderness present.     Comments: Mild joint tenderness over multiple joints but no limitation of range of motion.  No erythema.  No swelling.  Lymphadenopathy:  Cervical: Cervical adenopathy present.  Skin:    Capillary Refill: Capillary refill takes less than 2 seconds.  Neurological:     Mental Status: He is alert and oriented to person, place, and time.    ED Results / Procedures / Treatments   Labs (all labs ordered are listed, but only abnormal results are displayed) Labs Reviewed  COMPREHENSIVE METABOLIC PANEL - Abnormal; Notable for the following components:      Result Value   Sodium 133 (*)    Glucose, Bld 132 (*)    All other components within normal limits  CBC WITH DIFFERENTIAL/PLATELET - Abnormal; Notable for the following components:   WBC 39.7 (*)    RBC 4.20 (*)    Platelets 80 (*)    Lymphs Abs 34.0 (*)    Monocytes Absolute 2.6 (*)    All other components within normal limits  URINALYSIS, ROUTINE W REFLEX MICROSCOPIC - Abnormal; Notable for the following components:   Hgb urine dipstick SMALL (*)    All other components within normal limits  URINALYSIS, MICROSCOPIC (REFLEX) - Abnormal; Notable for the following components:   Bacteria, UA RARE (*)    All other components within normal limits  RESP PANEL BY RT-PCR (FLU A&B, COVID) ARPGX2  CULTURE, BLOOD (ROUTINE X 2)  CULTURE, BLOOD (ROUTINE X 2)  URINE CULTURE  LACTIC ACID, PLASMA  LACTIC ACID, PLASMA  PATHOLOGIST SMEAR REVIEW    EKG None  Radiology DG Chest 2 View  Result Date: 05/16/2021 CLINICAL DATA:  Pain in joints history of sepsis EXAM: CHEST - 2 VIEW COMPARISON:  04/01/2019 FINDINGS: The heart size and mediastinal contours are within normal limits. Both lungs are clear. The visualized  skeletal structures are unremarkable. IMPRESSION: No active cardiopulmonary disease. Electronically Signed   By: Donavan Foil M.D.   On: 05/16/2021 21:20    Procedures Procedures   Medications Ordered in ED Medications  ondansetron [facility]) injection 4 mg (4 mg Intravenous Given 05/16/21 2153)  lactated ringers bolus 1,000 mL (0 mLs Intravenous Stopped 05/16/21 2319)  acetaminophen (TYLENOL) tablet 1,000 mg (1,000 mg Oral Given 05/16/21 2210)    ED Course  I have reviewed the triage vital signs and the nursing notes.  Pertinent labs & imaging results that were available during my care of the patient were reviewed by me and considered in my medical decision making (see chart for details).    MDM Rules/Calculators/A&P                           Patient presents with fever.  Muscle aches.  Pains in multiple joints.  Fever upon arrival.  Has had previous bacteremia.  States this feels the same.  Has CLL.  White count is near his baseline at around 39.  Lactic acid is normal.  No clear source of infection.  Negative COVID and flu testing.  Chest x-ray reassuring.  Urine also does not show clear infection.  Has defervesced and vitals improved.  Patient is stable for discharge home.  Doubt a severe sepsis time.  Bacteremia felt less likely but cultures been sent.  Will be notified if come back positive.  Discharge home to follow-up with PCP Final Clinical Impression(s) / ED Diagnoses Final diagnoses:  Fever, unspecified fever cause  CLL (chronic lymphocytic leukemia) ([place])    Rx / DC Orders ED Discharge Orders     None        Davonna Belling, MD 05/16/21 2335

## 2021-05-16 NOTE — ED Triage Notes (Signed)
Pt state has Hx of sepsis in 2018. States having same Sx now. Pain in joints , both arms. Knees' right are/shoulder is worse.

## 2021-05-16 NOTE — ED Notes (Addendum)
Spoke with lab who is going to add urine culture onto urine already collected

## 2021-05-17 ENCOUNTER — Encounter: Payer: No Typology Code available for payment source | Admitting: Gastroenterology

## 2021-05-17 LAB — PATHOLOGIST SMEAR REVIEW

## 2021-05-18 ENCOUNTER — Other Ambulatory Visit: Payer: Self-pay

## 2021-05-18 ENCOUNTER — Telehealth (HOSPITAL_BASED_OUTPATIENT_CLINIC_OR_DEPARTMENT_OTHER): Payer: Self-pay | Admitting: Emergency Medicine

## 2021-05-18 ENCOUNTER — Inpatient Hospital Stay (HOSPITAL_BASED_OUTPATIENT_CLINIC_OR_DEPARTMENT_OTHER)
Admission: EM | Admit: 2021-05-18 | Discharge: 2021-05-24 | DRG: 872 | Disposition: A | Discharge status: Home / Self Care | Payer: No Typology Code available for payment source | Attending: Family Medicine | Admitting: Family Medicine

## 2021-05-18 ENCOUNTER — Encounter (HOSPITAL_BASED_OUTPATIENT_CLINIC_OR_DEPARTMENT_OTHER): Payer: Self-pay | Admitting: Emergency Medicine

## 2021-05-18 ENCOUNTER — Emergency Department (HOSPITAL_BASED_OUTPATIENT_CLINIC_OR_DEPARTMENT_OTHER): Payer: No Typology Code available for payment source

## 2021-05-18 DIAGNOSIS — F411 Generalized anxiety disorder: Secondary | ICD-10-CM | POA: Diagnosis not present

## 2021-05-18 DIAGNOSIS — A408 Other streptococcal sepsis: Principal | ICD-10-CM | POA: Diagnosis present

## 2021-05-18 DIAGNOSIS — C9111 Chronic lymphocytic leukemia of B-cell type in remission: Secondary | ICD-10-CM | POA: Diagnosis present

## 2021-05-18 DIAGNOSIS — Z79899 Other long term (current) drug therapy: Secondary | ICD-10-CM

## 2021-05-18 DIAGNOSIS — Z20822 Contact with and (suspected) exposure to covid-19: Secondary | ICD-10-CM | POA: Diagnosis not present

## 2021-05-18 DIAGNOSIS — F17290 Nicotine dependence, other tobacco product, uncomplicated: Secondary | ICD-10-CM | POA: Diagnosis present

## 2021-05-18 DIAGNOSIS — K219 Gastro-esophageal reflux disease without esophagitis: Secondary | ICD-10-CM | POA: Diagnosis not present

## 2021-05-18 DIAGNOSIS — Z9049 Acquired absence of other specified parts of digestive tract: Secondary | ICD-10-CM | POA: Diagnosis not present

## 2021-05-18 DIAGNOSIS — R739 Hyperglycemia, unspecified: Secondary | ICD-10-CM | POA: Diagnosis not present

## 2021-05-18 DIAGNOSIS — Z885 Allergy status to narcotic agent status: Secondary | ICD-10-CM | POA: Diagnosis not present

## 2021-05-18 DIAGNOSIS — M25512 Pain in left shoulder: Secondary | ICD-10-CM | POA: Diagnosis present

## 2021-05-18 DIAGNOSIS — R531 Weakness: Secondary | ICD-10-CM | POA: Diagnosis present

## 2021-05-18 DIAGNOSIS — D696 Thrombocytopenia, unspecified: Secondary | ICD-10-CM | POA: Diagnosis not present

## 2021-05-18 DIAGNOSIS — Z8249 Family history of ischemic heart disease and other diseases of the circulatory system: Secondary | ICD-10-CM

## 2021-05-18 DIAGNOSIS — Z881 Allergy status to other antibiotic agents status: Secondary | ICD-10-CM | POA: Diagnosis not present

## 2021-05-18 DIAGNOSIS — K056 Periodontal disease, unspecified: Secondary | ICD-10-CM | POA: Diagnosis not present

## 2021-05-18 DIAGNOSIS — E785 Hyperlipidemia, unspecified: Secondary | ICD-10-CM | POA: Diagnosis not present

## 2021-05-18 DIAGNOSIS — R7881 Bacteremia: Secondary | ICD-10-CM | POA: Diagnosis present

## 2021-05-18 DIAGNOSIS — I10 Essential (primary) hypertension: Secondary | ICD-10-CM | POA: Diagnosis present

## 2021-05-18 DIAGNOSIS — Z8719 Personal history of other diseases of the digestive system: Secondary | ICD-10-CM

## 2021-05-18 DIAGNOSIS — J45909 Unspecified asthma, uncomplicated: Secondary | ICD-10-CM | POA: Diagnosis not present

## 2021-05-18 DIAGNOSIS — Z888 Allergy status to other drugs, medicaments and biological substances status: Secondary | ICD-10-CM

## 2021-05-18 DIAGNOSIS — I350 Nonrheumatic aortic (valve) stenosis: Secondary | ICD-10-CM | POA: Diagnosis present

## 2021-05-18 DIAGNOSIS — B955 Unspecified streptococcus as the cause of diseases classified elsewhere: Secondary | ICD-10-CM

## 2021-05-18 DIAGNOSIS — C9110 Chronic lymphocytic leukemia of B-cell type not having achieved remission: Secondary | ICD-10-CM | POA: Diagnosis present

## 2021-05-18 DIAGNOSIS — A409 Streptococcal sepsis, unspecified: Secondary | ICD-10-CM | POA: Diagnosis present

## 2021-05-18 LAB — COMPREHENSIVE METABOLIC PANEL
ALT: 25 U/L (ref 0–44)
AST: 26 U/L (ref 15–41)
Albumin: 4.3 g/dL (ref 3.5–5.0)
Alkaline Phosphatase: 94 U/L (ref 38–126)
Anion gap: 8 (ref 5–15)
BUN: 13 mg/dL (ref 6–20)
CO2: 24 mmol/L (ref 22–32)
Calcium: 9.1 mg/dL (ref 8.9–10.3)
Chloride: 105 mmol/L (ref 98–111)
Creatinine, Ser: 0.93 mg/dL (ref 0.61–1.24)
GFR, Estimated: >60 mL/min (ref >60)
Glucose, Bld: 149 mg/dL — ABNORMAL HIGH (ref 70–99)
Potassium: 3.8 mmol/L (ref 3.5–5.1)
Sodium: 137 mmol/L (ref 135–145)
Total Bilirubin: 0.7 mg/dL (ref 0.3–1.2)
Total Protein: 7.3 g/dL (ref 6.5–8.1)

## 2021-05-18 LAB — URINALYSIS, ROUTINE W REFLEX MICROSCOPIC
Bilirubin Urine: NEGATIVE
Glucose, UA: NEGATIVE mg/dL
Hgb urine dipstick: NEGATIVE
Ketones, ur: NEGATIVE mg/dL
Leukocytes,Ua: NEGATIVE
Nitrite: NEGATIVE
Protein, ur: NEGATIVE mg/dL
Specific Gravity, Urine: 1.02 (ref 1.005–1.030)
pH: 5.5 (ref 5.0–8.0)

## 2021-05-18 LAB — BLOOD CULTURE ID PANEL (REFLEXED) - BCID2

## 2021-05-18 LAB — CBC WITH DIFFERENTIAL/PLATELET
Abs Immature Granulocytes: 0.02 10*3/uL (ref 0.00–0.07)
Basophils Absolute: 0 10*3/uL (ref 0.0–0.1)
Basophils Relative: 0 %
Eosinophils Absolute: 0 10*3/uL (ref 0.0–0.5)
Eosinophils Relative: 0 %
HCT: 39.5 % (ref 39.0–52.0)
Hemoglobin: 13.4 g/dL (ref 13.0–17.0)
Immature Granulocytes: 0 %
Lymphocytes Relative: 88 %
Lymphs Abs: 24.6 10*3/uL — ABNORMAL HIGH (ref 0.7–4.0)
MCH: 32.2 pg (ref 26.0–34.0)
MCHC: 33.9 g/dL (ref 30.0–36.0)
MCV: 95 fL (ref 80.0–100.0)
Monocytes Absolute: 1.4 10*3/uL — ABNORMAL HIGH (ref 0.1–1.0)
Monocytes Relative: 5 %
Neutro Abs: 2 10*3/uL (ref 1.7–7.7)
Neutrophils Relative %: 7 %
Platelets: 68 10*3/uL — ABNORMAL LOW (ref 150–400)
RBC: 4.16 MIL/uL — ABNORMAL LOW (ref 4.22–5.81)
RDW: 13.2 % (ref 11.5–15.5)
Smear Review: NORMAL
WBC Morphology: >10
WBC: 28.2 10*3/uL — ABNORMAL HIGH (ref 4.0–10.5)
nRBC: 0 % (ref 0.0–0.2)

## 2021-05-18 LAB — URINE CULTURE: Culture: NO GROWTH

## 2021-05-18 LAB — LACTIC ACID, PLASMA: Lactic Acid, Venous: 1 mmol/L (ref 0.5–1.9)

## 2021-05-18 LAB — RESP PANEL BY RT-PCR (FLU A&B, COVID) ARPGX2
Influenza A by PCR: NEGATIVE
Influenza B by PCR: NEGATIVE
SARS Coronavirus 2 by RT PCR: NEGATIVE

## 2021-05-18 LAB — LIPASE, BLOOD: Lipase: 37 U/L (ref 11–51)

## 2021-05-18 MED ORDER — SODIUM CHLORIDE 0.9 % IV SOLN
2.0000 g | INTRAVENOUS | Status: DC
  Administered 2021-05-18 – 2021-05-20 (×3): 2 g via INTRAVENOUS
  Filled 2021-05-18 (×3): qty 20

## 2021-05-18 MED ORDER — SODIUM CHLORIDE 0.9 % IV BOLUS
1000.0000 mL | Freq: Once | INTRAVENOUS | Status: AC
  Administered 2021-05-18: 1000 mL via INTRAVENOUS

## 2021-05-18 MED ORDER — PANTOPRAZOLE SODIUM 40 MG PO TBEC
40.0000 mg | DELAYED_RELEASE_TABLET | Freq: Every day | ORAL | Status: DC
  Administered 2021-05-19 – 2021-05-23 (×5): 40 mg via ORAL
  Filled 2021-05-18 (×5): qty 1

## 2021-05-18 MED ORDER — FAMOTIDINE 20 MG PO TABS
20.0000 mg | ORAL_TABLET | Freq: Every day | ORAL | Status: DC
  Administered 2021-05-18 – 2021-05-23 (×6): 20 mg via ORAL
  Filled 2021-05-18 (×6): qty 1

## 2021-05-18 MED ORDER — ACETAMINOPHEN 650 MG RE SUPP: 650.0000 mg | Freq: Four times a day (QID) | RECTAL | Status: DC | PRN

## 2021-05-18 MED ORDER — LACTATED RINGERS IV BOLUS
1000.0000 mL | Freq: Once | INTRAVENOUS | Status: AC
  Administered 2021-05-18: 1000 mL via INTRAVENOUS

## 2021-05-18 MED ORDER — KETOROLAC TROMETHAMINE 15 MG/ML IJ SOLN: 15.0000 mg | Freq: Four times a day (QID) | INTRAMUSCULAR | Status: DC | PRN

## 2021-05-18 MED ORDER — CARVEDILOL 3.125 MG PO TABS
3.1250 mg | ORAL_TABLET | Freq: Two times a day (BID) | ORAL | Status: DC
  Administered 2021-05-18 – 2021-05-23 (×11): 3.125 mg via ORAL
  Filled 2021-05-18 (×11): qty 1

## 2021-05-18 MED ORDER — ACETAMINOPHEN 325 MG PO TABS
650.0000 mg | ORAL_TABLET | Freq: Four times a day (QID) | ORAL | Status: DC | PRN
  Administered 2021-05-18 – 2021-05-23 (×12): 650 mg via ORAL
  Filled 2021-05-18 (×12): qty 2

## 2021-05-18 MED ORDER — ROSUVASTATIN CALCIUM 10 MG PO TABS
10.0000 mg | ORAL_TABLET | Freq: Every day | ORAL | Status: DC
  Administered 2021-05-19 – 2021-05-23 (×5): 10 mg via ORAL
  Filled 2021-05-18 (×5): qty 1

## 2021-05-18 MED ORDER — ONDANSETRON HCL 4 MG/2ML IJ SOLN: 4.0000 mg | Freq: Four times a day (QID) | INTRAMUSCULAR | Status: DC | PRN

## 2021-05-18 MED ORDER — LORATADINE 10 MG PO TABS
10.0000 mg | ORAL_TABLET | Freq: Every day | ORAL | Status: DC
  Administered 2021-05-19 – 2021-05-23 (×5): 10 mg via ORAL
  Filled 2021-05-18 (×5): qty 1

## 2021-05-18 MED ORDER — ENOXAPARIN SODIUM 40 MG/0.4ML IJ SOSY: 40.0000 mg | PREFILLED_SYRINGE | INTRAMUSCULAR | Status: DC

## 2021-05-18 MED ORDER — KETOROLAC TROMETHAMINE 30 MG/ML IJ SOLN
30.0000 mg | Freq: Once | INTRAMUSCULAR | Status: AC
  Administered 2021-05-18: 30 mg via INTRAVENOUS
  Filled 2021-05-18: qty 1

## 2021-05-18 MED ORDER — LACTATED RINGERS IV SOLN: INTRAVENOUS | Status: DC

## 2021-05-18 MED ORDER — ONDANSETRON HCL 4 MG/2ML IJ SOLN
4.0000 mg | Freq: Once | INTRAMUSCULAR | Status: AC
  Administered 2021-05-18: 4 mg via INTRAVENOUS
  Filled 2021-05-18: qty 2

## 2021-05-18 MED ORDER — ONDANSETRON HCL 4 MG PO TABS: 4.0000 mg | ORAL_TABLET | Freq: Four times a day (QID) | ORAL | Status: DC | PRN

## 2021-05-18 NOTE — ED Provider Notes (Signed)
[facility] EMERGENCY DEPARTMENT Provider Note   CSN: [id number] Arrival date & time: 05/18/21  1497     History Chief Complaint  Patient presents with   Abnormal Lab    Todd Novak is a 55 y.o. male.  History of CLL in remission not on chemotherapy.  Was seen here several days ago for body aches and fevers and chills.  Had unremarkable work-up but blood cultures were sent that came back positive.  He was told to come back for evaluation.  He continues to have chills and body aches similar to when he had his CLL first diagnosed.  He states that he had sepsis at that time as well.  The history is provided by the patient.  URI Presenting symptoms: fatigue and fever   Presenting symptoms: no cough, no ear pain and no sore throat   Severity:  Mild Onset quality:  Gradual Timing:  Constant Progression:  Unchanged Chronicity:  New Relieved by:  Nothing Worsened by:  Nothing Associated symptoms: arthralgias and myalgias   Associated symptoms: no neck pain       Past Medical History:  Diagnosis Date   Asthma    childhood, resolved   Barrett's esophagus    CLL (chronic lymphocytic leukemia) (HCC)    Environmental allergies    Fatigue    GERD (gastroesophageal reflux disease)    Heart murmur    HTN (hypertension)     Patient Active Problem List   Diagnosis Date Noted   Environmental allergies 03/25/2021   GAD (generalized anxiety disorder) 02/22/2021   Barrett's esophagus with dysplasia 10/17/2020   Exercise-induced asthma 08/23/2018   Recurrent cold sores 08/23/2018   Erectile dysfunction 08/23/2018   Bell's palsy 11/23/2017   Vaccine counseling 05/25/2017   Acute pain of left shoulder 05/04/2017   Sepsis due to Streptococcus agalactiae ([place]) 05/03/2017   History of Barrett's esophagus 01/20/2017   Sebaceous cyst 03/25/2016   Palpitations 05/09/2015   Aortic stenosis 05/09/2015   CLL (chronic lymphocytic leukemia) ([place]) 04/21/2015   Abnormal  finding on EKG 04/21/2015   Degeneration of intervertebral disc of lumbar region 08/22/2014   Bilateral lumbar radiculopathy 06/21/2014   Abdominal pain, chronic, epigastric 11/28/2013   Fatigue 11/28/2013   Essential hypertension 11/28/2013   GERD (gastroesophageal reflux disease) 11/28/2013    Past Surgical History:  Procedure Laterality Date   APPENDECTOMY     CHOLECYSTECTOMY     TEE WITHOUT CARDIOVERSION N/A 05/06/2017   Procedure: TRANSESOPHAGEAL ECHOCARDIOGRAM (TEE);  Surgeon: Sueanne Margarita, MD;  Location: [facility] ENDOSCOPY;  Service: Cardiovascular;  Laterality: N/A;   WISDOM TOOTH EXTRACTION         Family History  Problem Relation Age of Onset   Hypertension Mother        Living   Hypertension Father 2       Deceased   Healthy Sister    Hypertension Maternal Grandmother    Hypertension Maternal Grandfather    Healthy Daughter        x2   Healthy Son        x1   Cancer Neg Hx    Diabetes Neg Hx    Heart disease Neg Hx     Social History   Tobacco Use   Smoking status: Some Days    Types: Cigars   Smokeless tobacco: Never  Vaping Use   Vaping Use: Never used  Substance Use Topics   Alcohol use: Yes    Alcohol/week: 3.0 standard drinks  Types: 3 Glasses of wine per week    Comment: weekends   Drug use: Not Currently    Types: Marijuana    Comment: as a youngster    Home Medications Prior to Admission medications   Medication Sig Start Date End Date Taking? Authorizing Provider  acetaminophen (TYLENOL) 325 MG tablet Take 650 mg by mouth every 4 (four) hours as needed for mild pain, moderate pain or headache.    Yes [provider]  Ascorbic Acid (VITAMIN C) 100 MG tablet Take 100 mg by mouth daily.   Yes [provider]  carvedilol (COREG) 3.125 MG tablet Take 1 tablet (3.125 mg total) by mouth 2 (two) times daily. 04/04/21 07/03/21 Yes Tobb, Kardie, DO  famotidine (PEPCID) 20 MG tablet Take 1 tablet (20 mg total) by mouth at bedtime.  02/26/21  Yes Nandigam, Venia Minks, MD  fexofenadine (ALLEGRA) 180 MG tablet Take 1 tablet (180 mg total) by mouth daily. 05/30/16  Yes Debbrah Alar, NP  LYSINE PO Take 1 tablet by mouth daily.   Yes [provider]  metoprolol tartrate (LOPRESSOR) 100 MG tablet Take 2 hours prior to CT 04/04/21  Yes Tobb, Kardie, DO  Multiple Vitamin (MULTIVITAMIN) tablet Take 1 tablet by mouth daily.   Yes [provider]  omeprazole (PRILOSEC) 40 MG capsule Take 1 capsule (40 mg total) by mouth in the morning and at bedtime. 02/26/21  Yes Nandigam, Venia Minks, MD  rosuvastatin (CRESTOR) 10 MG tablet Take 1 tablet (10 mg total) by mouth daily. 04/04/21 07/03/21 Yes Tobb, Kardie, DO  valACYclovir (VALTREX) 1000 MG tablet TAKE 2 TABLETS BY MOUTH AT ONSET OF COLD SORE, THEN 2 TABLETS 12 HOURS LATER 02/22/21  Yes Shelda Pal, DO  vitamin E 400 UNIT capsule Take 400 Units by mouth daily.   Yes [provider]    Allergies    Codeine, Doxycycline, Methocarbamol, and Tramadol  Review of Systems   Review of Systems  Constitutional:  Positive for fatigue and fever. Negative for chills.  HENT:  Negative for ear pain and sore throat.   Eyes:  Negative for pain and visual disturbance.  Respiratory:  Negative for cough and shortness of breath.   Cardiovascular:  Negative for chest pain and palpitations.  Gastrointestinal:  Negative for abdominal pain and vomiting.  Genitourinary:  Negative for dysuria and hematuria.  Musculoskeletal:  Positive for arthralgias and myalgias. Negative for back pain, joint swelling, neck pain and neck stiffness.  Skin:  Negative for color change and rash.  Neurological:  Negative for seizures and syncope.  All other systems reviewed and are negative.  Physical Exam Updated Vital Signs BP 126/87   Pulse 82   Temp 97.9 F (36.6 C) (Oral)   Resp 14   Ht 5\' 9"  (1.753 m)   Wt 89.5 kg   SpO2 96%   BMI 29.14 kg/m   Physical Exam Vitals and nursing  note reviewed.  Constitutional:      General: He is not in acute distress.    Appearance: He is well-developed. He is ill-appearing.  HENT:     Head: Normocephalic and atraumatic.     Nose: Nose normal.     Mouth/Throat:     Mouth: Mucous membranes are moist.  Eyes:     Extraocular Movements: Extraocular movements intact.     Conjunctiva/sclera: Conjunctivae normal.     Pupils: Pupils are equal, round, and reactive to light.  Cardiovascular:     Rate and Rhythm: Normal  rate and regular rhythm.     Pulses: Normal pulses.     Heart sounds: Normal heart sounds. No murmur heard. Pulmonary:     Effort: Pulmonary effort is normal. No respiratory distress.     Breath sounds: Normal breath sounds.  Abdominal:     Palpations: Abdomen is soft.     Tenderness: There is no abdominal tenderness.  Musculoskeletal:     Cervical back: Neck supple.  Lymphadenopathy:     Cervical: Cervical adenopathy present.  Skin:    General: Skin is warm and dry.     Capillary Refill: Capillary refill takes less than 2 seconds.  Neurological:     General: No focal deficit present.     Mental Status: He is alert.  Psychiatric:        Mood and Affect: Mood normal.    ED Results / Procedures / Treatments   Labs (all labs ordered are listed, but only abnormal results are displayed) Labs Reviewed  CBC WITH DIFFERENTIAL/PLATELET - Abnormal; Notable for the following components:      Result Value   WBC 28.2 (*)    RBC 4.16 (*)    Platelets 68 (*)    Lymphs Abs 24.6 (*)    Monocytes Absolute 1.4 (*)    All other components within normal limits  COMPREHENSIVE METABOLIC PANEL - Abnormal; Notable for the following components:   Glucose, Bld 149 (*)    All other components within normal limits  CULTURE, BLOOD (ROUTINE X 2)  CULTURE, BLOOD (ROUTINE X 2)  URINE CULTURE  RESP PANEL BY RT-PCR (FLU A&B, COVID) ARPGX2  LIPASE, BLOOD  LACTIC ACID, PLASMA  URINALYSIS, ROUTINE W REFLEX MICROSCOPIC  LACTIC  ACID, PLASMA    EKG EKG Interpretation  Date/Time:  Saturday May 18 2021 09:42:16 EDT Ventricular Rate:  82 PR Interval:  177 QRS Duration: 96 QT Interval:  371 QTC Calculation: 434 R Axis:   67 Text Interpretation: Sinus rhythm LVH with secondary repolarization abnormality Confirmed by Lennice Sites ([id number]) on 05/18/2021 9:50:59 AM  Radiology DG Chest 2 View  Result Date: 05/16/2021 CLINICAL DATA:  Pain in joints history of sepsis EXAM: CHEST - 2 VIEW COMPARISON:  04/01/2019 FINDINGS: The heart size and mediastinal contours are within normal limits. Both lungs are clear. The visualized skeletal structures are unremarkable. IMPRESSION: No active cardiopulmonary disease. Electronically Signed   By: Donavan Foil M.D.   On: 05/16/2021 21:20   DG Chest Portable 1 View  Result Date: 05/18/2021 CLINICAL DATA:  55 year old male with bacteremia. EXAM: PORTABLE CHEST - 1 VIEW COMPARISON:  05/16/2021 FINDINGS: The mediastinal contours are within normal limits. No cardiomegaly. The lungs are clear bilaterally without evidence of focal consolidation, pleural effusion, or pneumothorax. No acute osseous abnormality. IMPRESSION: No acute cardiopulmonary process. Electronically Signed   By: Ruthann Cancer M.D.   On: 05/18/2021 10:19    Procedures .Critical Care Performed by: Lennice Sites, DO Authorized by: Lennice Sites, DO   Critical care provider statement:    Critical care time (minutes):  35   Critical care was necessary to treat or prevent imminent or life-threatening deterioration of the following conditions:  Sepsis   Critical care was time spent personally by me on the following activities:  Development of treatment plan with patient or surrogate, blood draw for specimens, discussions with consultants, discussions with primary provider, obtaining history from patient or surrogate, ordering and performing treatments and interventions, ordering and review of laboratory studies,  re-evaluation of patient's condition, review  of old charts, pulse oximetry and ordering and review of radiographic studies   Care discussed with: admitting provider     Medications Ordered in ED Medications  cefTRIAXone (ROCEPHIN) 2 g in sodium chloride 0.9 % 100 mL IVPB (2 g Intravenous New Bag/Given 05/18/21 1004)  sodium chloride 0.9 % bolus 1,000 mL (1,000 mLs Intravenous New Bag/Given 05/18/21 1000)  ondansetron (ZOFRAN) injection 4 mg (4 mg Intravenous Given 05/18/21 1000)    ED Course  I have reviewed the triage vital signs and the nursing notes.  Pertinent labs & imaging results that were available during my care of the patient were reviewed by me and considered in my medical decision making (see chart for details).    MDM Rules/Calculators/A&P                           BEACHER EVERY is here after having a positive blood culture growing strep species.  History of CLL.  Was seen here 2 days ago for body aches fevers and chills similar to when he was first diagnosed with CLL.  Had unremarkable work-up.  Blood cultures were sent and are positive for strep species.  Talked with Dr. Juleen China with infectious disease who recommends admission and starting patient on IV Rocephin.  New blood cultures have been sent.  Chest x-ray negative for infection.  Lactic acid is normal.  White count 29.  Overall patient bacteremic.  Not undergoing any active treatment with CLL at this time.  Hemodynamically stable.  To be admitted to medicine.  This chart was dictated using voice recognition software.  Despite best efforts to proofread,  errors can occur which can change the documentation meaning.   Final Clinical Impression(s) / ED Diagnoses Final diagnoses:  Streptococcal bacteremia    Rx / DC Orders ED Discharge Orders     None        Lennice Sites, DO 05/18/21 1104

## 2021-05-18 NOTE — Telephone Encounter (Signed)
Lab called with positive blood culture. Consulted with Dr. Laverta Baltimore who advised pt should return to ED for re-evaluation. Will have day shift charge to follow up with pt.

## 2021-05-18 NOTE — Progress Notes (Signed)
Pt w temp spike of 101.9, MD made aware and orders received. Will cont to monitor.

## 2021-05-18 NOTE — H&P (Signed)
History and Physical    Todd Novak EYC:[id number] DOB: 1965-12-05 DOA: 05/18/2021  PCP: Shelda Pal, DO   Patient coming from: Home.   I have personally briefly reviewed patient's old medical records in [facility]  Chief Complaint: Positive blood cultures.  HPI: Todd Novak is a 55 y.o. male with medical history significant of childhood asthma, GERD, barrettes esophagus, environmental allergies, fatigue, GERD, aortic stenosis, hypertension, CLL who is returning to the emergency department 2 days later due to having a positive blood culture that was drawn 2 days ago when he initially went to the emergency department with complaints of fever and body aches.  He was asked to come to the emergency department due to his blood cultures growing gram-positive cocci.  He continues to have fever, hydrologist and myalgias.  He also has had a frontal headache.  No rhinorrhea, sore throat, wheezing, dyspnea or hemoptysis.  He has anterior cervical adenopathies.  Denied chest pain, palpitations, diaphoresis, PND, orthopnea or pitting edema of the lower extremities.  No dysuria, frequency or hematuria.  No polyuria, polydipsia, polyphagia or blurred vision.  ED Course: Initial vital signs were temperature 97.9 F, pulse 87, respiration 18, BP 153/93 mmHg O2 sat 99% on room air.  The patient received a 1000 mL NS bolus, ondansetron 4 mg IVP x1 and was started on ceftriaxone 2 g IVPB every 24 hours after discussion with infectious diseases on-call.  Lab work: His urinalysis was normal.  Coronavirus and influenza PCR was negative.  CBC showed a white count of 28.2 with 88% lymphocytes, 7% neutrophils and 5% monocytes, platelets of 68.  CMP was normal except for glucose of 149 mg/dL.  Normal lipase and lactic acid.  Repeat blood cultures x2 were drawn.  Imaging: Chest radiographs from today and 2 days ago did not show any acute cardiopulmonary pathology.  Please see images and full  radiology report for further details.  Review of Systems: As per HPI otherwise all other systems reviewed and are negative.  Past Medical History:  Diagnosis Date   Asthma    childhood, resolved   Barrett's esophagus    CLL (chronic lymphocytic leukemia) (HCC)    Environmental allergies    Fatigue    GERD (gastroesophageal reflux disease)    Heart murmur    HTN (hypertension)    Past Surgical History:  Procedure Laterality Date   APPENDECTOMY     CHOLECYSTECTOMY     TEE WITHOUT CARDIOVERSION N/A 05/06/2017   Procedure: TRANSESOPHAGEAL ECHOCARDIOGRAM (TEE);  Surgeon: Sueanne Margarita, MD;  Location: [facility] ENDOSCOPY;  Service: Cardiovascular;  Laterality: N/A;   WISDOM TOOTH EXTRACTION     Social History  reports that he has been smoking cigars. He has never used smokeless tobacco. He reports current alcohol use of about 3.0 standard drinks per week. He reports that he does not currently use drugs after having used the following drugs: Marijuana.  Allergies  Allergen Reactions   Codeine    Doxycycline Nausea Only   Methocarbamol Nausea Only    dizziness   Tramadol Nausea And Vomiting   Family History  Problem Relation Age of Onset   Hypertension Mother        Living   Hypertension Father 93       Deceased   Healthy Sister    Hypertension Maternal Grandmother    Hypertension Maternal [employer]    Healthy Daughter        x2   Healthy Son  x1   Cancer Neg Hx    Diabetes Neg Hx    Heart disease Neg Hx    Prior to Admission medications   Medication Sig Start Date End Date Taking? Authorizing Provider  acetaminophen (TYLENOL) 325 MG tablet Take 650 mg by mouth every 4 (four) hours as needed for mild pain, moderate pain or headache.    Yes [provider]  Ascorbic Acid (VITAMIN C) 100 MG tablet Take 100 mg by mouth daily.   Yes [provider]  carvedilol (COREG) 3.125 MG tablet Take 1 tablet (3.125 mg total) by mouth 2 (two) times daily. 04/04/21  07/03/21 Yes Tobb, Kardie, DO  famotidine (PEPCID) 20 MG tablet Take 1 tablet (20 mg total) by mouth at bedtime. 02/26/21  Yes Nandigam, Venia Minks, MD  fexofenadine (ALLEGRA) 180 MG tablet Take 1 tablet (180 mg total) by mouth daily. 05/30/16  Yes Debbrah Alar, NP  LYSINE PO Take 1 tablet by mouth daily.   Yes [provider]  metoprolol tartrate (LOPRESSOR) 100 MG tablet Take 2 hours prior to CT 04/04/21  Yes Tobb, Kardie, DO  Multiple Vitamin (MULTIVITAMIN) tablet Take 1 tablet by mouth daily.   Yes [provider]  omeprazole (PRILOSEC) 40 MG capsule Take 1 capsule (40 mg total) by mouth in the morning and at bedtime. 02/26/21  Yes Nandigam, Venia Minks, MD  rosuvastatin (CRESTOR) 10 MG tablet Take 1 tablet (10 mg total) by mouth daily. 04/04/21 07/03/21 Yes Tobb, Kardie, DO  valACYclovir (VALTREX) 1000 MG tablet TAKE 2 TABLETS BY MOUTH AT ONSET OF COLD SORE, THEN 2 TABLETS 12 HOURS LATER 02/22/21  Yes Shelda Pal, DO  vitamin E 400 UNIT capsule Take 400 Units by mouth daily.   Yes [provider]   Physical Exam: Vitals:   05/18/21 1100 05/18/21 1215 05/18/21 1223 05/18/21 1330  BP: 122/89 125/85  (!) 144/98  Pulse: 75 71  78  Resp: 20 16  18   Temp:   98.5 F (36.9 C) 99.5 F (37.5 C)  TempSrc:   Oral Oral  SpO2: 95% 97%  98%  Weight:      Height:       Constitutional: Febrile.  Looks acutely ill.  NAD, calm, comfortable Eyes: PERRL, lids and conjunctivae normal.  Injected conjunctiva. ENMT: Mucous membranes are mildly dry. Posterior pharynx clear of any exudate or lesions. Neck: Positive tender adenopathies in anterior cervical chain.  Supple, no masses, no thyromegaly Respiratory: clear to auscultation bilaterally, no wheezing, no crackles. Normal respiratory effort. No accessory muscle use.  Cardiovascular: Regular rate and rhythm, 2/6 systolic murmur,, no rubs / gallops. No extremity edema. 2+ pedal pulses. No carotid bruits.  Abdomen: No  distention.  Bowel sounds positive.  Soft, no tenderness, no masses palpated. No hepatosplenomegaly. Musculoskeletal: no clubbing / cyanosis.  Good ROM, no contractures. Normal muscle tone.  Skin: no acute rashes, lesions, ulcers on limited dermatological examination. Neurologic: CN 2-12 grossly intact. Sensation intact, DTR normal. Strength 5/5 in all 4.  Psychiatric: Normal judgment and insight. Alert and oriented x 3. Normal mood.   Labs on Admission: I have personally reviewed following labs and imaging studies  CBC: Recent Labs  Lab 05/16/21 2106 05/18/21 0938  WBC 39.7* 28.2*  NEUTROABS 3.0 2.0  HGB 13.8 13.4  HCT 39.1 39.5  MCV 93.1 95.0  PLT 80* 68*    Basic Metabolic Panel: Recent Labs  Lab 05/16/21 2106 05/18/21 0938  NA 133* 137  K 4.4 3.8  CL 100 105  CO2 24 24  GLUCOSE 132* 149*  BUN 13 13  CREATININE 1.04 0.93  CALCIUM 9.2 9.1    GFR: Estimated Creatinine Clearance: 98.1 mL/min (by C-G formula based on SCr of 0.93 mg/dL).  Liver Function Tests: Recent Labs  Lab 05/16/21 2106 05/18/21 0938  AST 25 26  ALT 28 25  ALKPHOS 98 94  BILITOT 0.9 0.7  PROT 7.3 7.3  ALBUMIN 4.5 4.3   Urine analysis:    Component Value Date/Time   COLORURINE YELLOW 05/18/2021 [place] 05/18/2021 1156   LABSPEC 1.020 05/18/2021 1156   [place] 5.5 05/18/2021 [place] 05/18/2021 1156   [place] 05/18/2021 [place] 05/18/2021 1156   [place] 05/18/2021 [place] 05/18/2021 1156     11/24/2013 1417   NITRITE NEGATIVE 05/18/2021 [place] 05/18/2021 1156   Radiological Exams on Admission: DG Chest 2 View  Result Date: 05/16/2021 CLINICAL DATA:  Pain in joints history of sepsis EXAM: CHEST - 2 VIEW COMPARISON:  04/01/2019 FINDINGS: The heart size and mediastinal contours are within normal limits. Both lungs are clear. The visualized skeletal structures are  unremarkable. IMPRESSION: No active cardiopulmonary disease. Electronically Signed   By: Donavan Foil M.D.   On: 05/16/2021 21:20   DG Chest Portable 1 View  Result Date: 05/18/2021 CLINICAL DATA:  55 year old male with bacteremia. EXAM: PORTABLE CHEST - 1 VIEW COMPARISON:  05/16/2021 FINDINGS: The mediastinal contours are within normal limits. No cardiomegaly. The lungs are clear bilaterally without evidence of focal consolidation, pleural effusion, or pneumothorax. No acute osseous abnormality. IMPRESSION: No acute cardiopulmonary process. Electronically Signed   By: Ruthann Cancer M.D.   On: 05/18/2021 10:19    04/15/2021 echocardiogram IMPRESSIONS:  1. Mild ASH noted but no SAM. No significant gradient across LVOT nted.  Left ventricular ejection fraction, by estimation, is 60 to 65%. The left  ventricle has normal function. The left ventricle has no regional wall  motion abnormalities. There is  moderate left ventricular hypertrophy. Left ventricular diastolic  parameters are consistent with Grade I diastolic dysfunction (impaired  relaxation).   2. Right ventricular systolic function is normal. The right ventricular  size is normal.   3. The mitral valve is normal in structure. Trivial mitral valve  regurgitation. No evidence of mitral stenosis.   4. The aortic valve is normal in structure. There is mild calcification  of the aortic valve. Aortic valve regurgitation is not visualized. No  aortic stenosis is present.   5. The inferior vena cava is normal in size with greater than 50%  respiratory variability, suggesting right atrial pressure of 3 mmHg.   EKG: Independently reviewed.  Vent. rate 82 BPM PR interval 177 ms QRS duration 96 ms QT/QTcB 371/434 ms P-R-T axes 41 67 251 Sinus rhythm LVH with secondary repolarization abnormality  Assessment/Plan Principal Problem:   Bacteremia Observation/MedSurg. Continue IV fluids. Continue ceftriaxone 2 g IVPB every 24  hours. Acetaminophen has not controlled the fever. He also does not want opioids for pain. Toradol 30 mg IVP x1 now. Then 15 mg IVP q6 hr for 1 day. However, cannot use ketorolac longer given thrombocytopenia.  Active Problems:   Essential hypertension Continue carvedilol 3.125 mg p.o. twice daily. Monitor blood pressure and heart rate.    GERD (gastroesophageal reflux disease)    History of Barrett's esophagus Continue PPI. Follow-up with GI as an outpatient.  CLL (chronic lymphocytic leukemia) (HCC) Monitor platelet count. Consider oncology consult.    Aortic stenosis No signs of decompensation. Follow-up with cardiology as an outpatient.    Hyperglycemia This was nonfasting. Will recheck in the morning.    DVT prophylaxis: Lovenox SQ. Code Status:   Full code. Family Communication:   Disposition Plan:   Patient is from:  Home.  Anticipated DC to:  Home.  Anticipated DC date:  05/19/2021 or 05/20/2021.  Anticipated DC barriers: Final culture results.  Consults called:   Admission status:  Observation/telemetry.  Severity of Illness:  High severity after presenting initially with fever, arthralgias and myalgias with blood cultures subsequently growing gram-positive cocci.  ID on-call has recommended admission to the hospital for IV antibiotic therapy pending antibiotic sensitivity.  Reubin Milan MD [facility]  How to contact the [facility] Attending or Consulting provider [place], [place] or covering provider during after hours [place], for this patient?   Check the care team in [facility] and look for a) attending/consulting [facility] provider listed and b) the [facility] team listed Log into [email or web address] and use [place]'s universal password to access. If you do not have the password, please contact the hospital operator. Locate the [facility] provider you are looking for under [facility] and page to a number that you can be directly reached. If you still have difficulty  reaching the provider, please page the [facility] (Director on Call) for the Hospitalists listed on amion for assistance.  05/18/2021, 1:37 PM   This document was prepared using Dragon voice recognition software and may contain some unintended transcription errors.

## 2021-05-18 NOTE — ED Triage Notes (Signed)
Pt found to have positive blood cultures, returns for care.

## 2021-05-19 DIAGNOSIS — C9110 Chronic lymphocytic leukemia of B-cell type not having achieved remission: Secondary | ICD-10-CM | POA: Diagnosis not present

## 2021-05-19 DIAGNOSIS — E785 Hyperlipidemia, unspecified: Secondary | ICD-10-CM | POA: Diagnosis present

## 2021-05-19 DIAGNOSIS — F17290 Nicotine dependence, other tobacco product, uncomplicated: Secondary | ICD-10-CM | POA: Diagnosis present

## 2021-05-19 DIAGNOSIS — D696 Thrombocytopenia, unspecified: Secondary | ICD-10-CM | POA: Diagnosis present

## 2021-05-19 DIAGNOSIS — A409 Streptococcal sepsis, unspecified: Secondary | ICD-10-CM | POA: Diagnosis not present

## 2021-05-19 DIAGNOSIS — I10 Essential (primary) hypertension: Secondary | ICD-10-CM

## 2021-05-19 DIAGNOSIS — J45909 Unspecified asthma, uncomplicated: Secondary | ICD-10-CM | POA: Diagnosis present

## 2021-05-19 DIAGNOSIS — M25512 Pain in left shoulder: Secondary | ICD-10-CM | POA: Diagnosis present

## 2021-05-19 DIAGNOSIS — C9111 Chronic lymphocytic leukemia of B-cell type in remission: Secondary | ICD-10-CM | POA: Diagnosis present

## 2021-05-19 DIAGNOSIS — Z888 Allergy status to other drugs, medicaments and biological substances status: Secondary | ICD-10-CM | POA: Diagnosis not present

## 2021-05-19 DIAGNOSIS — Z8249 Family history of ischemic heart disease and other diseases of the circulatory system: Secondary | ICD-10-CM | POA: Diagnosis not present

## 2021-05-19 DIAGNOSIS — Z79899 Other long term (current) drug therapy: Secondary | ICD-10-CM | POA: Diagnosis not present

## 2021-05-19 DIAGNOSIS — R739 Hyperglycemia, unspecified: Secondary | ICD-10-CM | POA: Diagnosis present

## 2021-05-19 DIAGNOSIS — I340 Nonrheumatic mitral (valve) insufficiency: Secondary | ICD-10-CM | POA: Diagnosis not present

## 2021-05-19 DIAGNOSIS — K219 Gastro-esophageal reflux disease without esophagitis: Secondary | ICD-10-CM | POA: Diagnosis present

## 2021-05-19 DIAGNOSIS — Z885 Allergy status to narcotic agent status: Secondary | ICD-10-CM | POA: Diagnosis not present

## 2021-05-19 DIAGNOSIS — R531 Weakness: Secondary | ICD-10-CM | POA: Diagnosis present

## 2021-05-19 DIAGNOSIS — I350 Nonrheumatic aortic (valve) stenosis: Secondary | ICD-10-CM | POA: Diagnosis present

## 2021-05-19 DIAGNOSIS — Z8719 Personal history of other diseases of the digestive system: Secondary | ICD-10-CM | POA: Diagnosis not present

## 2021-05-19 DIAGNOSIS — Z881 Allergy status to other antibiotic agents status: Secondary | ICD-10-CM | POA: Diagnosis not present

## 2021-05-19 DIAGNOSIS — Z9049 Acquired absence of other specified parts of digestive tract: Secondary | ICD-10-CM | POA: Diagnosis not present

## 2021-05-19 DIAGNOSIS — R7881 Bacteremia: Secondary | ICD-10-CM | POA: Diagnosis present

## 2021-05-19 DIAGNOSIS — Z20822 Contact with and (suspected) exposure to covid-19: Secondary | ICD-10-CM | POA: Diagnosis present

## 2021-05-19 DIAGNOSIS — F411 Generalized anxiety disorder: Secondary | ICD-10-CM | POA: Diagnosis present

## 2021-05-19 DIAGNOSIS — A408 Other streptococcal sepsis: Secondary | ICD-10-CM | POA: Diagnosis present

## 2021-05-19 DIAGNOSIS — K056 Periodontal disease, unspecified: Secondary | ICD-10-CM | POA: Diagnosis present

## 2021-05-19 LAB — URINE CULTURE: Culture: NO GROWTH

## 2021-05-19 LAB — BASIC METABOLIC PANEL
Anion gap: 5 (ref 5–15)
BUN: 15 mg/dL (ref 6–20)
CO2: 25 mmol/L (ref 22–32)
Calcium: 8.5 mg/dL — ABNORMAL LOW (ref 8.9–10.3)
Chloride: 106 mmol/L (ref 98–111)
Creatinine, Ser: 0.95 mg/dL (ref 0.61–1.24)
GFR, Estimated: >60 mL/min (ref >60)
Glucose, Bld: 139 mg/dL — ABNORMAL HIGH (ref 70–99)
Potassium: 4.2 mmol/L (ref 3.5–5.1)
Sodium: 136 mmol/L (ref 135–145)

## 2021-05-19 LAB — CBC
HCT: 34.3 % — ABNORMAL LOW (ref 39.0–52.0)
Hemoglobin: 11.1 g/dL — ABNORMAL LOW (ref 13.0–17.0)
MCH: 31.7 pg (ref 26.0–34.0)
MCHC: 32.4 g/dL (ref 30.0–36.0)
MCV: 98 fL (ref 80.0–100.0)
Platelets: 63 10*3/uL — ABNORMAL LOW (ref 150–400)
RBC: 3.5 MIL/uL — ABNORMAL LOW (ref 4.22–5.81)
RDW: 13.1 % (ref 11.5–15.5)
WBC: 26.8 10*3/uL — ABNORMAL HIGH (ref 4.0–10.5)
nRBC: 0.1 % (ref 0.0–0.2)

## 2021-05-19 LAB — HIV ANTIBODY (ROUTINE TESTING W REFLEX): HIV Screen 4th Generation wRfx: NONREACTIVE

## 2021-05-19 MED ORDER — TRAMADOL HCL 50 MG PO TABS
50.0000 mg | ORAL_TABLET | Freq: Four times a day (QID) | ORAL | Status: DC | PRN
  Administered 2021-05-20: 50 mg via ORAL
  Filled 2021-05-19: qty 1

## 2021-05-19 NOTE — Progress Notes (Addendum)
PROGRESS NOTE    ADARIAN BUR  BLT:[id number] DOB: 05-02-1966 DOA: 05/18/2021 PCP: Shelda Pal, DO    Brief Narrative:  Mr. Treese was admitted to the hospital with the working diagnosis of gram positive bacteremia.  55 year old male past medical history for asthma, GERD, Barrett esophagus, GERD, aortic stenosis, hypertension and CLL who presented with positive blood cultures.  2 days leading into his hospitalization he was seen at the emergency department for fevers and body aches, cultures were obtained and resulted positive.  Patient was called to come back to the hospital.  At home patient continued to have fever and myalgia.  On his initial physical examination his temperature was 97.9, heart rate 87, respiratory rate 18, blood pressure 153/93, oxygen saturation 99%.  Patient was ill-looking appearing, dry mucous membranes, tender adenopathies in the anterior cervical chain, lungs clear to auscultation bilaterally, heart S1-S2, present, rhythmic, 2/6 systolic murmur, abdomen soft nontender, no lower extremity edema.  Sodium 137, potassium 3.8, chloride 105, bicarb 24, glucose 149, BUN 13, creatinine 0.93, white count 28.2, hemoglobin 13.4, hematocrit 39.5, platelets 68. SARS COVID-19 negative.  Urine analysis specific gravity 1.020, negative nitrates.  Chest radiograph no infiltrates.  EKG 82 bpm, left axis deviation, normal intervals, sinus rhythm, Q-wave and T wave inversions lead II, lead III, aVF, V4-V6, J-point elevation V1 through V3 positive LVH.  Patient was placed on intravenous antibiotic therapy with ceftriaxone.  Assessment & Plan:   Principal Problem:   Bacteremia Active Problems:   Essential hypertension   GERD (gastroesophageal reflux disease)   CLL (chronic lymphocytic leukemia) (HCC)   History of Barrett's esophagus   Hyperglycemia   Gram positive bacteremia, Streptococcus species. Patient with periodontal disease, positive gum pain and edema  left upper mandible. Had dental work done in the past.  Wbc is 26.8 from 28.2  Last fever spike 101.9 yesterday 16:00  Plan to continue antibiotic therapy with IV ceftriaxone and follow up on cultures/ sensitivities.  Check orthopantogram films today and will contact his personal oral surgeon with results.   2. HTN/ dyslipidemia. Systolic blood pressure 007 to 140 mmHg today, continue close monitoring.  Continue with carvedilol. On statin therapy  3. GERD Antiacid therapy with famotidine and pantoprazole.   4. CLL Patient follows up with [facility] hematology. Has been under observation.  No current therapy.   7. Reactive hyperglycemia.  Fasting glucose this am 139, continue close monitoring.   Patient continue to be at high risk for worsening bacteremia   No aortic stenosis   The patient will require care spanning > 2 midnights and should be moved to inpatient because: IV antibiotic therapy   DVT prophylaxis: Enoxaparin   Code Status:     full Family Communication:  I spoke with patient's wife at the bedside, we talked in detail about patient's condition, plan of care and prognosis and all questions were addressed.   Antimicrobials:  Ceftriaxone     Subjective: Patient with no nausea or vomiting, no chest pain or dyspnea, no rashes, urinary symptoms or diarrhea.  Positive oral pain at the gum region on left upper mandible.   Objective: Vitals:   05/18/21 2256 05/19/21 0133 05/19/21 0425 05/19/21 0944  BP: 121/76 126/77 135/86 (!) 143/97  Pulse: 72 73 81 94  Resp:  18 17 18   Temp:  98.5 F (36.9 C) 98.9 F (37.2 C) 98.7 F (37.1 C)  TempSrc:  Oral Oral Oral  SpO2:  98% 97% 98%  Weight:  Height:        Intake/Output Summary (Last 24 hours) at 05/19/2021 1111 Last data filed at 05/19/2021 1000 Gross per 24 hour  Intake 4046.7 ml  Output 1850 ml  Net 2196.7 ml   Filed Weights   05/18/21 0939  Weight: 89.5 kg    Examination:   General: Not in pain  or dyspnea.  Neurology: Awake and alert, non focal  E ENT: no pallor, no icterus, oral mucosa moist Cardiovascular: No JVD. S1-S2 present, rhythmic, no gallops, rubs, or murmurs. No lower extremity edema. Pulmonary: vesicular breath sounds bilaterally, adequate air movement, no wheezing, rhonchi or rales. Gastrointestinal. Abdomen soft and non tender Skin. No rashes Musculoskeletal: no joint deformities     Data Reviewed: I have personally reviewed following labs and imaging studies  CBC: Recent Labs  Lab 05/16/21 2106 05/18/21 0938 05/19/21 0401  WBC 39.7* 28.2* 26.8*  NEUTROABS 3.0 2.0  --   HGB 13.8 13.4 11.1*  HCT 39.1 39.5 34.3*  MCV 93.1 95.0 98.0  PLT 80* 68* 63*   Basic Metabolic Panel: Recent Labs  Lab 05/16/21 2106 05/18/21 0938 05/19/21 0401  NA 133* 137 136  K 4.4 3.8 4.2  CL 100 105 106  CO2 24 24 25   GLUCOSE 132* 149* 139*  BUN 13 13 15   CREATININE 1.04 0.93 0.95  CALCIUM 9.2 9.1 8.5*   GFR: Estimated Creatinine Clearance: 96 mL/min (by C-G formula based on SCr of 0.95 mg/dL). Liver Function Tests: Recent Labs  Lab 05/16/21 2106 05/18/21 0938  AST 25 26  ALT 28 25  ALKPHOS 98 94  BILITOT 0.9 0.7  PROT 7.3 7.3  ALBUMIN 4.5 4.3   Recent Labs  Lab 05/18/21 0938  LIPASE 37   No results for input(s): AMMONIA in the last 168 hours. Coagulation Profile: No results for input(s): INR, PROTIME in the last 168 hours. Cardiac Enzymes: No results for input(s): CKTOTAL, CKMB, CKMBINDEX, TROPONINI in the last 168 hours. BNP (last 3 results) No results for input(s): PROBNP in the last 8760 hours. HbA1C: No results for input(s): HGBA1C in the last 72 hours. CBG: No results for input(s): GLUCAP in the last 168 hours. Lipid Profile: No results for input(s): CHOL, HDL, LDLCALC, TRIG, CHOLHDL, LDLDIRECT in the last 72 hours. Thyroid Function Tests: No results for input(s): TSH, T4TOTAL, FREET4, T3FREE, THYROIDAB in the last 72 hours. Anemia  Panel: No results for input(s): VITAMINB12, FOLATE, FERRITIN, TIBC, IRON, RETICCTPCT in the last 72 hours.    Radiology Studies: I have reviewed all of the imaging during this hospital visit personally     Scheduled Meds:  carvedilol  3.125 mg Oral BID WC   famotidine  20 mg Oral QHS   loratadine  10 mg Oral Daily   pantoprazole  40 mg Oral Daily   rosuvastatin  10 mg Oral Daily   Continuous Infusions:  cefTRIAXone (ROCEPHIN)  IV 2 g (05/19/21 0934)   lactated ringers 100 mL/hr at 05/19/21 0623     LOS: 0 days        Kidada Ging Gerome Apley, MD

## 2021-05-20 ENCOUNTER — Inpatient Hospital Stay (HOSPITAL_COMMUNITY): Payer: No Typology Code available for payment source

## 2021-05-20 DIAGNOSIS — C9110 Chronic lymphocytic leukemia of B-cell type not having achieved remission: Secondary | ICD-10-CM | POA: Diagnosis not present

## 2021-05-20 DIAGNOSIS — R7881 Bacteremia: Secondary | ICD-10-CM

## 2021-05-20 DIAGNOSIS — K219 Gastro-esophageal reflux disease without esophagitis: Secondary | ICD-10-CM | POA: Diagnosis not present

## 2021-05-20 DIAGNOSIS — I10 Essential (primary) hypertension: Secondary | ICD-10-CM | POA: Diagnosis not present

## 2021-05-20 LAB — ECHOCARDIOGRAM COMPLETE
Area-P 1/2: 3.57 cm2
Height: 69 in
S' Lateral: 2.6 cm
Weight: 3156.99 oz

## 2021-05-20 LAB — CULTURE, BLOOD (ROUTINE X 2): Special Requests: ADEQUATE

## 2021-05-20 LAB — CBC
HCT: 34.4 % — ABNORMAL LOW (ref 39.0–52.0)
Hemoglobin: 11.4 g/dL — ABNORMAL LOW (ref 13.0–17.0)
MCH: 31.9 pg (ref 26.0–34.0)
MCHC: 33.1 g/dL (ref 30.0–36.0)
MCV: 96.4 fL (ref 80.0–100.0)
Platelets: 79 10*3/uL — ABNORMAL LOW (ref 150–400)
RBC: 3.57 MIL/uL — ABNORMAL LOW (ref 4.22–5.81)
RDW: 13 % (ref 11.5–15.5)
WBC: 36.3 10*3/uL — ABNORMAL HIGH (ref 4.0–10.5)
nRBC: 0 % (ref 0.0–0.2)

## 2021-05-20 MED ORDER — PENICILLIN G POTASSIUM 20000000 UNITS IJ SOLR
12.0000 10*6.[IU] | Freq: Two times a day (BID) | INTRAVENOUS | Status: DC
  Administered 2021-05-20 – 2021-05-24 (×8): 12 10*6.[IU] via INTRAVENOUS
  Filled 2021-05-20 (×11): qty 12

## 2021-05-20 MED ORDER — INFLUENZA VAC SPLIT QUAD 0.5 ML IM SUSY: 0.5000 mL | PREFILLED_SYRINGE | INTRAMUSCULAR | Status: DC

## 2021-05-20 MED ORDER — CEFAZOLIN SODIUM-DEXTROSE 2-4 GM/100ML-% IV SOLN
2.0000 g | Freq: Three times a day (TID) | INTRAVENOUS | Status: DC
  Filled 2021-05-20: qty 100

## 2021-05-20 NOTE — Progress Notes (Signed)
Pharmacy Antibiotic Note  Todd Novak is a 55 y.o. male admitted on 05/18/2021 .  Pharmacy has been consulted for ancef dosing for streptococcus constellatus bacteremia 05/20/2021 Day #3 Ceftriaxone.  WBC elevated but pt w/ CLL. SCr WNL.   Patient with periodontal disease, positive gum pain and edema left upper mandible 10/24 echo neg for vegetations  Plan: Ancef 2 gm IV q8h  Height: 5\' 9"  (175.3 cm) Weight: 89.5 kg (197 lb 5 oz) IBW/kg (Calculated) : 70.7  Temp (24hrs), Avg:98.9 F (37.2 C), Min:98.3 F (36.8 C), Max:99.5 F (37.5 C)  Recent Labs  Lab 05/16/21 2106 05/18/21 0938 05/19/21 0401 05/20/21 0410  WBC 39.7* 28.2* 26.8* 36.3*  CREATININE 1.04 0.93 0.95  --   LATICACIDVEN 0.9 1.0  --   --     Estimated Creatinine Clearance: 96 mL/min (by C-G formula based on SCr of 0.95 mg/dL).    Allergies  Allergen Reactions   Codeine Nausea And Vomiting    Dizziness   Doxycycline Nausea Only   Methocarbamol Nausea Only    dizziness   Tramadol Nausea And Vomiting    Antimicrobials this admission: 10/22 CTX>>10/24 10/24 ancef>> Dose adjustments this admission:  Microbiology results: 10/22  UCx ngF 10/22 4/4 bottles: pan sensitive streptococcus constellatus  Thank you for allowing pharmacy to be a part of this patient's care.  Eudelia Bunch, Pharm.D 05/20/2021 1:49 PM

## 2021-05-20 NOTE — Progress Notes (Signed)
  Echocardiogram 2D Echocardiogram has been performed.  Darlina Sicilian M 05/20/2021, 8:49 AM

## 2021-05-20 NOTE — Progress Notes (Signed)
PROGRESS NOTE    [name]  WNU:[id number] DOB: [birth year] DOA: 05/18/2021 PCP: Shelda Pal, DO    Brief Narrative:  Todd Novak was admitted to the hospital with the working diagnosis of gram positive bacteremia.   55 year old male past medical history for asthma, GERD, Barrett esophagus, GERD, aortic stenosis, hypertension and CLL who presented with positive blood cultures.  2 days leading into his hospitalization he was seen at the emergency department for fevers and body aches, cultures were obtained and resulted positive.  Patient was called to come back to the hospital.  At home patient continued to have fever and myalgia.  On his initial physical examination his temperature was 97.9, heart rate 87, respiratory rate 18, blood pressure 153/93, oxygen saturation 99%.  Patient was ill-looking appearing, dry mucous membranes, tender adenopathies in the anterior cervical chain, lungs clear to auscultation bilaterally, heart S1-S2, present, rhythmic, 2/6 systolic murmur, abdomen soft nontender, no lower extremity edema.   Sodium 137, potassium 3.8, chloride 105, bicarb 24, glucose 149, BUN 13, creatinine 0.93, white count 28.2, hemoglobin 13.4, hematocrit 39.5, platelets 68. SARS COVID-19 negative.   Urine analysis specific gravity 1.020, negative nitrates.   Chest radiograph no infiltrates.   EKG 82 bpm, left axis deviation, normal intervals, sinus rhythm, Q-wave and T wave inversions lead II, lead III, aVF, V4-V6, J-point elevation V1 through V3 positive LVH.   Patient was placed on intravenous antibiotic therapy with ceftriaxone. Culture positive for streptococcus sensitive to penicillin.   Assessment & Plan:   Principal Problem:   Bacteremia Active Problems:   Essential hypertension   GERD (gastroesophageal reflux disease)   CLL (chronic lymphocytic leukemia) (HCC)   History of Barrett's esophagus   Hyperglycemia     Gram positive bacteremia, Streptococcus  constellatus.  Patient has been afebrile, his admission blood cultures are positive for streptococcus.  His wbc continue to be elevated 36.3  Transthoracic echocardiogram with preserved LV systolic function and no evidence of vegetation.   Change antibiotic therapy to cefazolin and consult ID (Dr. Linus Salmons) for further recommendations. Patient may need TEE Repeat blood cultures today  2. HTN/ dyslipidemia.  Blood pressure control with carvedilol.  Continue with statin therapy   3. GERD On famotidine and pantoprazole.    4. CLL Patient follows up with [facility] hematology. Has been under observation.  No current therapy.   Continue to have leukocytosis.    7. Reactive hyperglycemia.  hyperglycemia has resolved.   Patient continue to be at high risk for worsening bacteremia   Status is: Inpatient  Remains inpatient appropriate because: IV antibiotic therapy    DVT prophylaxis: Enoxaparin   Code Status:    full  Family Communication:   I spoke with patient's wife at the bedside, we talked in detail about patient's condition, plan of care and prognosis and all questions were addressed.     Consultants:  ID   Antimicrobials:  Penicillin    Subjective: Patient with no nausea or vomiting, no chest pain or dyspnea, no abdominal pain. Positive left shoulder pain.   Objective: Vitals:   05/19/21 1738 05/19/21 2116 05/20/21 0611 05/20/21 0929  BP: (!) 141/88 116/74 (!) 141/93 (!) 147/94  Pulse: 81 80 80 81  Resp: 18 17 18 18   Temp: 98.3 F (36.8 C) 99.3 F (37.4 C) 99.5 F (37.5 C) 98.6 F (37 C)  TempSrc: Oral Oral Oral Oral  SpO2: 97% 98% 97% 97%  Weight:      Height:  Intake/Output Summary (Last 24 hours) at 05/20/2021 1317 Last data filed at 05/20/2021 1000 Gross per 24 hour  Intake 1252 ml  Output 1100 ml  Net 152 ml   Filed Weights   05/18/21 0939  Weight: 89.5 kg    Examination:   General: Not in pain or dyspnea.  Neurology: Awake and  alert, non focal  E ENT: no pallor, no icterus, oral mucosa moist Cardiovascular: No JVD. S1-S2 present, rhythmic, no gallops, rubs, or murmurs. No lower extremity edema. Pulmonary: vesicular breath sounds bilaterally, adequate air movement, no wheezing, rhonchi or rales. Gastrointestinal. Abdomen soft and non tender Skin. No rashes Musculoskeletal: right shoulder with no erythema or edema, not tender to palpation or with movement.     Data Reviewed: I have personally reviewed following labs and imaging studies  CBC: Recent Labs  Lab 05/16/21 2106 05/18/21 0938 05/19/21 0401 05/20/21 0410  WBC 39.7* 28.2* 26.8* 36.3*  NEUTROABS 3.0 2.0  --   --   HGB 13.8 13.4 11.1* 11.4*  HCT 39.1 39.5 34.3* 34.4*  MCV 93.1 95.0 98.0 96.4  PLT 80* 68* 63* 79*   Basic Metabolic Panel: Recent Labs  Lab 05/16/21 2106 05/18/21 0938 05/19/21 0401  NA 133* 137 136  K 4.4 3.8 4.2  CL 100 105 106  CO2 24 24 25   GLUCOSE 132* 149* 139*  BUN 13 13 15   CREATININE 1.04 0.93 0.95  CALCIUM 9.2 9.1 8.5*   GFR: Estimated Creatinine Clearance: 96 mL/min (by C-G formula based on SCr of 0.95 mg/dL). Liver Function Tests: Recent Labs  Lab 05/16/21 2106 05/18/21 0938  AST 25 26  ALT 28 25  ALKPHOS 98 94  BILITOT 0.9 0.7  PROT 7.3 7.3  ALBUMIN 4.5 4.3   Recent Labs  Lab 05/18/21 0938  LIPASE 37   No results for input(s): AMMONIA in the last 168 hours. Coagulation Profile: No results for input(s): INR, PROTIME in the last 168 hours. Cardiac Enzymes: No results for input(s): CKTOTAL, CKMB, CKMBINDEX, TROPONINI in the last 168 hours. BNP (last 3 results) No results for input(s): PROBNP in the last 8760 hours. HbA1C: No results for input(s): HGBA1C in the last 72 hours. CBG: No results for input(s): GLUCAP in the last 168 hours. Lipid Profile: No results for input(s): CHOL, HDL, LDLCALC, TRIG, CHOLHDL, LDLDIRECT in the last 72 hours. Thyroid Function Tests: No results for input(s):  TSH, T4TOTAL, FREET4, T3FREE, THYROIDAB in the last 72 hours. Anemia Panel: No results for input(s): VITAMINB12, FOLATE, FERRITIN, TIBC, IRON, RETICCTPCT in the last 72 hours.    Radiology Studies: I have reviewed all of the imaging during this hospital visit personally     Scheduled Meds:  carvedilol  3.125 mg Oral BID WC   famotidine  20 mg Oral QHS   loratadine  10 mg Oral Daily   pantoprazole  40 mg Oral Daily   rosuvastatin  10 mg Oral Daily   Continuous Infusions:  cefTRIAXone (ROCEPHIN)  IV 2 g (05/20/21 1111)     LOS: 1 day        Lakevia Perris Gerome Apley, MD

## 2021-05-20 NOTE — Progress Notes (Signed)
Pt did not want to wear SCDs anymore, MD aware, order d/cd.

## 2021-05-20 NOTE — Consult Note (Signed)
[facility] for Infectious Disease       Reason for Consult: bacteremia    Referring Physician: Dr. Cathlean Sauer  Principal Problem:   Bacteremia Active Problems:   Essential hypertension   GERD (gastroesophageal reflux disease)   CLL (chronic lymphocytic leukemia) (HCC)   History of Barrett's esophagus   Hyperglycemia    carvedilol  3.125 mg Oral BID WC   famotidine  20 mg Oral QHS   loratadine  10 mg Oral Daily   pantoprazole  40 mg Oral Daily   rosuvastatin  10 mg Oral Daily    Recommendations:  Continue ceftriaxone TEE - I have called the cardsmaster  Assessment: He has mulitiple blood cultures positive for Strep constellatus and differential includes infective endocarditis.  Risk is from poor dentition.     Antibiotics: Ceftriaxone > cefazolin  HPI: Todd Novak is a 55 y.o. male with a history of CLL, aortic stenosis who initally came to the ED on 10/20 with fever, headache and nausea and was called back due to bacteremia with Strep constellatus in the anaerobic blood cultures bottles in each set. Repeated blood cultures also again positive with Strep constellatus and on antibiotics.  TTE did not find any vegetation.    Review of Systems:  Constitutional: negative for fevers and chills Gastrointestinal: negative for nausea and diarrhea Integument/breast: negative for rash All other systems reviewed and are negative    Past Medical History:  Diagnosis Date   Asthma    childhood, resolved   Barrett's esophagus    CLL (chronic lymphocytic leukemia) (HCC)    Environmental allergies    Fatigue    GERD (gastroesophageal reflux disease)    Heart murmur    HTN (hypertension)     Social History   Tobacco Use   Smoking status: Some Days    Types: Cigars   Smokeless tobacco: Never  Vaping Use   Vaping Use: Never used  Substance Use Topics   Alcohol use: Yes    Alcohol/week: 3.0 standard drinks    Types: 3 Glasses of wine per week    Comment:  weekends   Drug use: Not Currently    Types: Marijuana    Comment: as a youngster    Family History  Problem Relation Age of Onset   Hypertension Mother        Living   Hypertension Father 45       Deceased   Healthy Sister    Hypertension Maternal Grandmother    Hypertension Maternal Grandfather    Healthy Daughter        x2   Healthy Son        x1   Cancer Neg Hx    Diabetes Neg Hx    Heart disease Neg Hx     Allergies  Allergen Reactions   Codeine Nausea And Vomiting    Dizziness   Doxycycline Nausea Only   Methocarbamol Nausea Only    dizziness   Tramadol Nausea And Vomiting    Physical Exam: Constitutional: in no apparent distress  Vitals:   05/20/21 0611 05/20/21 0929  BP: (!) 141/93 (!) 147/94  Pulse: 80 81  Resp: 18 18  Temp: 99.5 F (37.5 C) 98.6 F (37 C)  SpO2: 97% 97%   EYES: anicteric ENMT: no thrush Cardiovascular: Cor RRR and No murmurs Respiratory: normal respiratory effort Musculoskeletal: no pedal edema noted Skin: negatives: no rash Neuro: non-focal  Lab Results  Component Value Date   WBC 36.3 (H)  05/20/2021   HGB 11.4 (L) 05/20/2021   HCT 34.4 (L) 05/20/2021   MCV 96.4 05/20/2021   PLT 79 (L) 05/20/2021    Lab Results  Component Value Date   CREATININE 0.95 05/19/2021   BUN 15 05/19/2021   NA 136 05/19/2021   K 4.2 05/19/2021   CL 106 05/19/2021   CO2 25 05/19/2021    Lab Results  Component Value Date   ALT 25 05/18/2021   AST 26 05/18/2021   ALKPHOS 94 05/18/2021     Microbiology: Recent Results (from the past 240 hour(s))  Culture, blood (routine x 2)     Status: Abnormal   Collection Time: 05/16/21  9:04 PM   Specimen: BLOOD  Result Value Ref Range Status   Specimen Description   Final    BLOOD RIGHT ANTECUBITAL Performed at [facility], [address] [place], [place] [address]    Special Requests   Final    BOTTLES DRAWN AEROBIC AND ANAEROBIC Blood Culture adequate volume Performed at [facility], [address] [place], [place] [address]    Culture  Setup Time   Final    GRAM POSITIVE COCCI ANAEROBIC BOTTLE ONLY CRITICAL VALUE NOTED.  VALUE IS CONSISTENT WITH PREVIOUSLY REPORTED AND CALLED VALUE. IN BOTH AEROBIC AND ANAEROBIC BOTTLES    Culture (A)  Final    STREPTOCOCCUS CONSTELLATUS SUSCEPTIBILITIES PERFORMED ON PREVIOUS CULTURE WITHIN THE LAST 5 DAYS. Performed at [facility], [place] [address] [place], [place] [address]    Report Status 05/20/2021 FINAL  Final  Resp Panel by RT-PCR (Flu A&B, Covid) Nasopharyngeal Swab     Status: None   Collection Time: 05/16/21  9:14 PM   Specimen: Nasopharyngeal Swab; Nasopharyngeal(NP) swabs in vial transport medium  Result Value Ref Range Status   SARS Coronavirus 2 by RT PCR NEGATIVE NEGATIVE Final    Comment: (NOTE) SARS-CoV-2 target nucleic acids are NOT DETECTED.  The SARS-CoV-2 RNA is generally detectable in upper respiratory specimens during the acute phase of infection. The lowest concentration of SARS-CoV-2 viral copies this assay can detect is 138 copies/mL. A negative result does not preclude SARS-Cov-2 infection and should not be used as the sole basis for treatment or other patient management decisions. A negative result may occur with  improper specimen collection/handling, submission of specimen other than nasopharyngeal swab, presence of viral mutation(s) within the areas targeted by this assay, and inadequate number of viral copies(<138 copies/mL). A negative result must be combined with clinical observations, patient history, and epidemiological information. The expected result is Negative.  Fact Sheet for Patients:  EntrepreneurPulse.com.au  Fact Sheet for Healthcare Providers:  IncredibleEmployment.be  This test is no t yet approved or cleared by the [place] FDA and  has been authorized for detection and/or diagnosis of SARS-CoV-2 by FDA  under an Emergency Use Authorization (EUA). This EUA will remain  in effect (meaning this test can be used) for the duration of the COVID-19 declaration under Section 564(b)(1) of the Act, 21 U.S.C.section 360bbb-3(b)(1), unless the authorization is terminated  or revoked sooner.       Influenza A by PCR NEGATIVE NEGATIVE Final   Influenza B by PCR NEGATIVE NEGATIVE Final    Comment: (NOTE) The Xpert Xpress SARS-CoV-2/FLU/RSV plus assay is intended as an aid in the diagnosis of influenza from Nasopharyngeal swab specimens and should not be used as a sole basis for treatment. Nasal washings and aspirates are unacceptable for Xpert  Xpress SARS-CoV-2/FLU/RSV testing.  Fact Sheet for Patients: EntrepreneurPulse.com.au  Fact Sheet for Healthcare Providers: IncredibleEmployment.be  This test is not yet approved or cleared by the [place] FDA and has been authorized for detection and/or diagnosis of SARS-CoV-2 by FDA under an Emergency Use Authorization (EUA). This EUA will remain in effect (meaning this test can be used) for the duration of the COVID-19 declaration under Section 564(b)(1) of the Act, 21 U.S.C. section 360bbb-3(b)(1), unless the authorization is terminated or revoked.  Performed at [facility], [address] [place], [place] [address]   Culture, blood (routine x 2)     Status: Abnormal   Collection Time: 05/16/21  9:15 PM   Specimen: BLOOD  Result Value Ref Range Status   Specimen Description   Final    BLOOD SITE NOT SPECIFIED Performed at [facility], [address] [place], [place] [address]    Special Requests   Final    BOTTLES DRAWN AEROBIC AND ANAEROBIC Blood Culture results may not be optimal due to an inadequate volume of blood received in culture bottles Performed at [facility], [address] [place], [place] [address]    Culture  Setup Time   Final    GRAM POSITIVE  COCCI ANAEROBIC BOTTLE ONLY CRITICAL RESULT CALLED TO, READ BACK BY AND VERIFIED WITH: RN KELLY NEAL 05/18/21@00 :40 BY TW IN BOTH AEROBIC AND ANAEROBIC BOTTLES    Culture (A)  Final    STREPTOCOCCUS CONSTELLATUS Beta hemolytic streptococci are predictably susceptible to penicillin and other beta lactams. Susceptibility testing not routinely performed. Performed at [facility], [place] [address] [place], [place] [address]    Report Status 05/20/2021 FINAL  Final   Organism ID, Bacteria STREPTOCOCCUS CONSTELLATUS  Final      Susceptibility   Streptococcus constellatus - MIC*    PENICILLIN <=0.06 SENSITIVE Sensitive     CEFTRIAXONE 0.25 SENSITIVE Sensitive     ERYTHROMYCIN <=0.12 SENSITIVE Sensitive     LEVOFLOXACIN <=0.25 SENSITIVE Sensitive     VANCOMYCIN 0.5 SENSITIVE Sensitive     * STREPTOCOCCUS CONSTELLATUS  Blood Culture ID Panel (Reflexed)     Status: Abnormal   Collection Time: 05/16/21  9:15 PM  Result Value Ref Range Status   Enterococcus faecalis NOT DETECTED NOT DETECTED Final   Enterococcus Faecium NOT DETECTED NOT DETECTED Final   Listeria monocytogenes NOT DETECTED NOT DETECTED Final   Staphylococcus species NOT DETECTED NOT DETECTED Final   Staphylococcus aureus (BCID) NOT DETECTED NOT DETECTED Final   Staphylococcus epidermidis NOT DETECTED NOT DETECTED Final   Staphylococcus lugdunensis NOT DETECTED NOT DETECTED Final   Streptococcus species DETECTED (A) NOT DETECTED Final    Comment: Not Enterococcus species, Streptococcus agalactiae, Streptococcus pyogenes, or Streptococcus pneumoniae. CRITICAL RESULT CALLED TO, READ BACK BY AND VERIFIED WITH: RN KELLY NEAL 05/18/21@00 :40 BY TW    Streptococcus agalactiae NOT DETECTED NOT DETECTED Final   Streptococcus pneumoniae NOT DETECTED NOT DETECTED Final   Streptococcus pyogenes NOT DETECTED NOT DETECTED Final   A.calcoaceticus-baumannii NOT DETECTED NOT DETECTED Final   Bacteroides fragilis NOT DETECTED NOT  DETECTED Final   Enterobacterales NOT DETECTED NOT DETECTED Final   Enterobacter cloacae complex NOT DETECTED NOT DETECTED Final   Escherichia coli NOT DETECTED NOT DETECTED Final   Klebsiella aerogenes NOT DETECTED NOT DETECTED Final   Klebsiella oxytoca NOT DETECTED NOT DETECTED Final   Klebsiella pneumoniae NOT DETECTED NOT DETECTED Final   Proteus species NOT  DETECTED NOT DETECTED Final   Salmonella species NOT DETECTED NOT DETECTED Final   Serratia marcescens NOT DETECTED NOT DETECTED Final   Haemophilus influenzae NOT DETECTED NOT DETECTED Final   Neisseria meningitidis NOT DETECTED NOT DETECTED Final   Pseudomonas aeruginosa NOT DETECTED NOT DETECTED Final   Stenotrophomonas maltophilia NOT DETECTED NOT DETECTED Final   Candida albicans NOT DETECTED NOT DETECTED Final   Candida auris NOT DETECTED NOT DETECTED Final   Candida glabrata NOT DETECTED NOT DETECTED Final   Candida krusei NOT DETECTED NOT DETECTED Final   Candida parapsilosis NOT DETECTED NOT DETECTED Final   Candida tropicalis NOT DETECTED NOT DETECTED Final   Cryptococcus neoformans/gattii NOT DETECTED NOT DETECTED Final    Comment: Performed at [facility], [place] [address], [place], [place] [address]  Urine Culture     Status: None   Collection Time: 05/16/21 11:15 PM   Specimen: Urine, Clean Catch  Result Value Ref Range Status   Specimen Description   Final    URINE, CLEAN CATCH Performed at [facility], [address] [place], [place] [address]    Special Requests   Final    NONE Performed at [facility], [address] [place], [place] [address]    Culture   Final    NO GROWTH Performed at [facility], [place] [address], [place], [place] [address]    Report Status 05/18/2021 FINAL  Final  Blood culture (routine x 2)     Status: Abnormal (Preliminary result)   Collection Time: 05/18/21  9:38 AM   Specimen: BLOOD  Result Value Ref Range Status   Specimen Description    Final    BLOOD LEFT ANTECUBITAL Performed at [facility], [address] [place], [place] [address]    Special Requests   Final    BOTTLES DRAWN AEROBIC AND ANAEROBIC Blood Culture adequate volume Performed at [facility], [address] [place], [place] [address]    Culture  Setup Time   Final    GRAM POSITIVE COCCI IN PAIRS AND CHAINS IN BOTH AEROBIC AND ANAEROBIC BOTTLES CRITICAL VALUE NOTED.  VALUE IS CONSISTENT WITH PREVIOUSLY REPORTED AND CALLED VALUE.    Culture (A)  Final    STREPTOCOCCUS CONSTELLATUS SUSCEPTIBILITIES PERFORMED ON PREVIOUS CULTURE WITHIN THE LAST 5 DAYS. Performed at [facility], [place] [address] [place], [place] [address]    Report Status PENDING  Incomplete  Blood culture (routine x 2)     Status: Abnormal (Preliminary result)   Collection Time: 05/18/21  9:48 AM   Specimen: BLOOD LEFT HAND  Result Value Ref Range Status   Specimen Description   Final    BLOOD LEFT HAND Performed at [facility], [address] [place], [place] [address]    Special Requests   Final    BOTTLES DRAWN AEROBIC AND ANAEROBIC Blood Culture adequate volume Performed at [facility], [address] [place], [place] [address]    Culture  Setup Time   Final    GRAM POSITIVE COCCI IN PAIRS AND CHAINS IN BOTH AEROBIC AND ANAEROBIC BOTTLES CRITICAL VALUE NOTED.  VALUE IS CONSISTENT WITH PREVIOUSLY REPORTED AND CALLED VALUE.    Culture (A)  Final    STREPTOCOCCUS CONSTELLATUS SUSCEPTIBILITIES PERFORMED ON PREVIOUS CULTURE WITHIN THE LAST 5 DAYS. Performed at [facility], [place] [address] [place],  [address]  Report Status PENDING  Incomplete  Resp Panel by RT-PCR (Flu A&B, Covid) Nasopharyngeal Swab     Status: None   Collection Time: 05/18/21 10:24 AM   Specimen: Nasopharyngeal Swab; Nasopharyngeal(NP) swabs in vial transport medium  Result Value Ref Range Status   SARS Coronavirus 2 by RT PCR NEGATIVE  NEGATIVE Final    Comment: (NOTE) SARS-CoV-2 target nucleic acids are NOT DETECTED.  The SARS-CoV-2 RNA is generally detectable in upper respiratory specimens during the acute phase of infection. The lowest concentration of SARS-CoV-2 viral copies this assay can detect is 138 copies/mL. A negative result does not preclude SARS-Cov-2 infection and should not be used as the sole basis for treatment or other patient management decisions. A negative result may occur with  improper specimen collection/handling, submission of specimen other than nasopharyngeal swab, presence of viral mutation(s) within the areas targeted by this assay, and inadequate number of viral copies(<138 copies/mL). A negative result must be combined with clinical observations, patient history, and epidemiological information. The expected result is Negative.  Fact Sheet for Patients:  EntrepreneurPulse.com.au  Fact Sheet for Healthcare Providers:  IncredibleEmployment.be  This test is no t yet approved or cleared by the [place] FDA and  has been authorized for detection and/or diagnosis of SARS-CoV-2 by FDA under an Emergency Use Authorization (EUA). This EUA will remain  in effect (meaning this test can be used) for the duration of the COVID-19 declaration under Section 564(b)(1) of the Act, 21 U.S.C.section 360bbb-3(b)(1), unless the authorization is terminated  or revoked sooner.       Influenza A by PCR NEGATIVE NEGATIVE Final   Influenza B by PCR NEGATIVE NEGATIVE Final    Comment: (NOTE) The Xpert Xpress SARS-CoV-2/FLU/RSV plus assay is intended as an aid in the diagnosis of influenza from Nasopharyngeal swab specimens and should not be used as a sole basis for treatment. Nasal washings and aspirates are unacceptable for Xpert Xpress SARS-CoV-2/FLU/RSV testing.  Fact Sheet for Patients: EntrepreneurPulse.com.au  Fact Sheet for Healthcare  Providers: IncredibleEmployment.be  This test is not yet approved or cleared by the [place] FDA and has been authorized for detection and/or diagnosis of SARS-CoV-2 by FDA under an Emergency Use Authorization (EUA). This EUA will remain in effect (meaning this test can be used) for the duration of the COVID-19 declaration under Section 564(b)(1) of the Act, 21 U.S.C. section 360bbb-3(b)(1), unless the authorization is terminated or revoked.  Performed at [facility], [address] [place], [place] [address]   Urine Culture     Status: None   Collection Time: 05/18/21 11:56 AM   Specimen: Urine, Clean Catch  Result Value Ref Range Status   Specimen Description   Final    URINE, CLEAN CATCH Performed at [facility], [address] [place], [place] [address]    Special Requests   Final    NONE Performed at [facility], [address] [place], [place] [address]    Culture   Final    NO GROWTH Performed at [facility], [place] [address] [place],  [address]    Report Status 05/19/2021 FINAL  Final    Thayer Headings, [place] for Infectious Disease [facility] [email or web address] 05/20/2021, 1:54 PM

## 2021-05-21 DIAGNOSIS — C9110 Chronic lymphocytic leukemia of B-cell type not having achieved remission: Secondary | ICD-10-CM | POA: Diagnosis not present

## 2021-05-21 DIAGNOSIS — K219 Gastro-esophageal reflux disease without esophagitis: Secondary | ICD-10-CM | POA: Diagnosis not present

## 2021-05-21 DIAGNOSIS — R7881 Bacteremia: Secondary | ICD-10-CM | POA: Diagnosis not present

## 2021-05-21 DIAGNOSIS — I10 Essential (primary) hypertension: Secondary | ICD-10-CM | POA: Diagnosis not present

## 2021-05-21 LAB — CBC WITH DIFFERENTIAL/PLATELET
Abs Immature Granulocytes: 0.03 10*3/uL (ref 0.00–0.07)
Basophils Absolute: 0 10*3/uL (ref 0.0–0.1)
Basophils Relative: 0 %
Eosinophils Absolute: 0.1 10*3/uL (ref 0.0–0.5)
Eosinophils Relative: 0 %
HCT: 36.4 % — ABNORMAL LOW (ref 39.0–52.0)
Hemoglobin: 12.1 g/dL — ABNORMAL LOW (ref 13.0–17.0)
Immature Granulocytes: 0 %
Lymphocytes Relative: 89 %
Lymphs Abs: 21.6 10*3/uL — ABNORMAL HIGH (ref 0.7–4.0)
MCH: 32 pg (ref 26.0–34.0)
MCHC: 33.2 g/dL (ref 30.0–36.0)
MCV: 96.3 fL (ref 80.0–100.0)
Monocytes Absolute: 1.2 10*3/uL — ABNORMAL HIGH (ref 0.1–1.0)
Monocytes Relative: 5 %
Neutro Abs: 1.4 10*3/uL — ABNORMAL LOW (ref 1.7–7.7)
Neutrophils Relative %: 6 %
Platelets: 91 10*3/uL — ABNORMAL LOW (ref 150–400)
RBC: 3.78 MIL/uL — ABNORMAL LOW (ref 4.22–5.81)
RDW: 13.1 % (ref 11.5–15.5)
WBC: 24.4 10*3/uL — ABNORMAL HIGH (ref 4.0–10.5)
nRBC: 0 % (ref 0.0–0.2)

## 2021-05-21 LAB — BASIC METABOLIC PANEL
Anion gap: 9 (ref 5–15)
BUN: 12 mg/dL (ref 6–20)
CO2: 27 mmol/L (ref 22–32)
Calcium: 9.2 mg/dL (ref 8.9–10.3)
Chloride: 103 mmol/L (ref 98–111)
Creatinine, Ser: 0.98 mg/dL (ref 0.61–1.24)
GFR, Estimated: >60 mL/min (ref >60)
Glucose, Bld: 149 mg/dL — ABNORMAL HIGH (ref 70–99)
Potassium: 4.2 mmol/L (ref 3.5–5.1)
Sodium: 139 mmol/L (ref 135–145)

## 2021-05-21 LAB — CULTURE, BLOOD (ROUTINE X 2)
Special Requests: ADEQUATE
Special Requests: ADEQUATE

## 2021-05-21 LAB — PATHOLOGIST SMEAR REVIEW

## 2021-05-21 NOTE — Progress Notes (Signed)
PROGRESS NOTE    ERIS BRECK  BWI:[id number] DOB: 1965-10-14 DOA: 05/18/2021 PCP: [place]a Pal, DO    Brief Narrative:  Mr. Cummiskey was admitted to the hospital with the working diagnosis of gram positive bacteremia, streptococcus constellatus.     55 year old male past medical history for asthma, GERD, Barrett esophagus, GERD, aortic stenosis, hypertension and CLL who presented with positive blood cultures.  2 days leading into his hospitalization he was seen at the emergency department for fevers and body aches, cultures were obtained and resulted positive.  Patient was called to come back to the hospital.  At home patient continued to have fever and myalgia.  On his initial physical examination his temperature was 97.9, heart rate 87, respiratory rate 18, blood pressure 153/93, oxygen saturation 99%.  Patient was ill-looking appearing, dry mucous membranes, tender adenopathies in the anterior cervical chain, lungs clear to auscultation bilaterally, heart S1-S2, present, rhythmic, 2/6 systolic murmur, abdomen soft nontender, no lower extremity edema.   Sodium 137, potassium 3.8, chloride 105, bicarb 24, glucose 149, BUN 13, creatinine 0.93, white count 28.2, hemoglobin 13.4, hematocrit 39.5, platelets 68. SARS COVID-19 negative.   Urine analysis specific gravity 1.020, negative nitrates.   Chest radiograph no infiltrates.   EKG 82 bpm, left axis deviation, normal intervals, sinus rhythm, Q-wave and T wave inversions lead II, lead III, aVF, V4-V6, J-point elevation V1 through V3 positive LVH.   Patient was placed on intravenous antibiotic therapy with ceftriaxone. Culture positive for streptococcus sensitive to penicillin.   ID consulted with recommendations for further work up with TEE, antibiotic therapy changed to penicillin.    Assessment & Plan:   Principal Problem:   Bacteremia Active Problems:   Essential hypertension   GERD (gastroesophageal reflux disease)    CLL (chronic lymphocytic leukemia) (HCC)   History of Barrett's esophagus   Hyperglycemia       Gram positive bacteremia, Streptococcus constellatus.  Transthoracic echocardiogram with preserved LV systolic function and no evidence of vegetation.  Patient is feeling well, he has been afebrile and wbc is 24,4  Follow up cultures from 10/22 with persistent bacteremia (prior to start antibiotic therapy) Cultures from 10/24 with no growth. Plan to TEE cardiology has been consulted.    2. HTN/ dyslipidemia.  Continue blood pressure control with carvedilol and continue statin with rosuvastatin.    3. GERD Continue with famotidine and pantoprazole.    4. CLL Patient follows up with [facility] hematology. Has been under observation.  No current therapy.    Follow cell count, patient will follow up with hematology as outpatient.    7. Reactive hyperglycemia.  hyperglycemia has resolved.  Patient is tolerating po well.   Patient continue to be at high risk for worsening bacteremia   Status is: Inpatient  Remains inpatient appropriate because: IV antibiotic therapy    DVT prophylaxis: Enoxaparin   Code Status:    full  Family Communication:   No family at the bedside      Consultants:  ID     Antimicrobials:  Penicillin     Subjective: Patient with no nausea or vomiting, no chest pain or dyspnea, right shoulder pain has resolved.   Objective: Vitals:   05/20/21 0929 05/20/21 1405 05/20/21 2035 05/21/21 0630  BP: (!) 147/94 (!) 142/105 139/90 (!) 139/93  Pulse: 81 75 71 70  Resp: 18 18 18 18   Temp: 98.6 F (37 C) 98.5 F (36.9 C) 98.7 F (37.1 C) 98.6 F (37 C)  TempSrc: Oral Oral Oral Oral  SpO2: 97% 99% 96% 97%  Weight:      Height:        Intake/Output Summary (Last 24 hours) at 05/21/2021 1347 Last data filed at 05/21/2021 0900 Gross per 24 hour  Intake 2478.1 ml  Output --  Net 2478.1 ml   Filed Weights   05/18/21 0939  Weight: 89.5 kg     Examination:   General: Not in pain or dyspnea.  Neurology: Awake and alert, non focal  E ENT: no pallor, no icterus, oral mucosa moist Cardiovascular: No JVD. S1-S2 present, rhythmic, no gallops, rubs, or murmurs. No lower extremity edema. Pulmonary: positive breath sounds bilaterally, adequate air movement, no wheezing, rhonchi or rales. Gastrointestinal. Abdomen soft and non tender Skin. No rashes Musculoskeletal: no joint deformities     Data Reviewed: I have personally reviewed following labs and imaging studies  CBC: Recent Labs  Lab 05/16/21 2106 05/18/21 0938 05/19/21 0401 05/20/21 0410 05/21/21 0437  WBC 39.7* 28.2* 26.8* 36.3* 24.4*  NEUTROABS 3.0 2.0  --   --  1.4*  HGB 13.8 13.4 11.1* 11.4* 12.1*  HCT 39.1 39.5 34.3* 34.4* 36.4*  MCV 93.1 95.0 98.0 96.4 96.3  PLT 80* 68* 63* 79* 91*   Basic Metabolic Panel: Recent Labs  Lab 05/16/21 2106 05/18/21 0938 05/19/21 0401 05/21/21 0437  NA 133* 137 136 139  K 4.4 3.8 4.2 4.2  CL 100 105 106 103  CO2 24 24 25 27   GLUCOSE 132* 149* 139* 149*  BUN 13 13 15 12   CREATININE 1.04 0.93 0.95 0.98  CALCIUM 9.2 9.1 8.5* 9.2   GFR: Estimated Creatinine Clearance: 93.1 mL/min (by C-G formula based on SCr of 0.98 mg/dL). Liver Function Tests: Recent Labs  Lab 05/16/21 2106 05/18/21 0938  AST 25 26  ALT 28 25  ALKPHOS 98 94  BILITOT 0.9 0.7  PROT 7.3 7.3  ALBUMIN 4.5 4.3   Recent Labs  Lab 05/18/21 0938  LIPASE 37   No results for input(s): AMMONIA in the last 168 hours. Coagulation Profile: No results for input(s): INR, PROTIME in the last 168 hours. Cardiac Enzymes: No results for input(s): CKTOTAL, CKMB, CKMBINDEX, TROPONINI in the last 168 hours. BNP (last 3 results) No results for input(s): PROBNP in the last 8760 hours. HbA1C: No results for input(s): HGBA1C in the last 72 hours. CBG: No results for input(s): GLUCAP in the last 168 hours. Lipid Profile: No results for input(s): CHOL,  HDL, LDLCALC, TRIG, CHOLHDL, LDLDIRECT in the last 72 hours. Thyroid Function Tests: No results for input(s): TSH, T4TOTAL, FREET4, T3FREE, THYROIDAB in the last 72 hours. Anemia Panel: No results for input(s): VITAMINB12, FOLATE, FERRITIN, TIBC, IRON, RETICCTPCT in the last 72 hours.    Radiology Studies: I have reviewed all of the imaging during this hospital visit personally     Scheduled Meds:  carvedilol  3.125 mg Oral BID WC   famotidine  20 mg Oral QHS   influenza vac split quadrivalent PF  0.5 mL Intramuscular Tomorrow-1000   loratadine  10 mg Oral Daily   pantoprazole  40 mg Oral Daily   rosuvastatin  10 mg Oral Daily   Continuous Infusions:  penicillin g continuous IV infusion 12 Million Units (05/21/21 0314)     LOS: 2 days        Sevastian Witczak Gerome Apley, MD

## 2021-05-22 DIAGNOSIS — E785 Hyperlipidemia, unspecified: Secondary | ICD-10-CM | POA: Diagnosis present

## 2021-05-22 DIAGNOSIS — Z8719 Personal history of other diseases of the digestive system: Secondary | ICD-10-CM | POA: Diagnosis not present

## 2021-05-22 DIAGNOSIS — D696 Thrombocytopenia, unspecified: Secondary | ICD-10-CM | POA: Diagnosis present

## 2021-05-22 DIAGNOSIS — I10 Essential (primary) hypertension: Secondary | ICD-10-CM | POA: Diagnosis not present

## 2021-05-22 DIAGNOSIS — A409 Streptococcal sepsis, unspecified: Secondary | ICD-10-CM

## 2021-05-22 DIAGNOSIS — C9110 Chronic lymphocytic leukemia of B-cell type not having achieved remission: Secondary | ICD-10-CM | POA: Diagnosis not present

## 2021-05-22 LAB — BASIC METABOLIC PANEL
Anion gap: 8 (ref 5–15)
BUN: 13 mg/dL (ref 6–20)
CO2: 26 mmol/L (ref 22–32)
Calcium: 8.9 mg/dL (ref 8.9–10.3)
Chloride: 102 mmol/L (ref 98–111)
Creatinine, Ser: 1.08 mg/dL (ref 0.61–1.24)
GFR, Estimated: >60 mL/min (ref >60)
Glucose, Bld: 126 mg/dL — ABNORMAL HIGH (ref 70–99)
Potassium: 4.1 mmol/L (ref 3.5–5.1)
Sodium: 136 mmol/L (ref 135–145)

## 2021-05-22 LAB — CBC
HCT: 37.4 % — ABNORMAL LOW (ref 39.0–52.0)
Hemoglobin: 12.4 g/dL — ABNORMAL LOW (ref 13.0–17.0)
MCH: 32 pg (ref 26.0–34.0)
MCHC: 33.2 g/dL (ref 30.0–36.0)
MCV: 96.4 fL (ref 80.0–100.0)
Platelets: 97 10*3/uL — ABNORMAL LOW (ref 150–400)
RBC: 3.88 MIL/uL — ABNORMAL LOW (ref 4.22–5.81)
RDW: 13.1 % (ref 11.5–15.5)
WBC: 28.9 10*3/uL — ABNORMAL HIGH (ref 4.0–10.5)
nRBC: 0 % (ref 0.0–0.2)

## 2021-05-22 MED ORDER — MENTHOL 3 MG MT LOZG
1.0000 | LOZENGE | OROMUCOSAL | Status: DC | PRN
  Filled 2021-05-22: qty 9

## 2021-05-22 NOTE — Assessment & Plan Note (Signed)
Continue Crestor 

## 2021-05-22 NOTE — TOC Initial Note (Signed)
Transition of Care [facility]) - Initial/Assessment Note   Patient Details  Name: Todd Novak MRN: [id number] Date of Birth: Jun 24, 1966  [facility]) CM/SW Contact:    Sherie Don, LCSW Phone Number: 05/22/2021, 2:29 PM  Clinical Narrative: CSW notified by physician that patient may possibly need IV antibiotics at discharge and requested referral be made to [place]. CSW made referral to [facility] with [employer] to monitor patient in case he will need to discharge home with IV antibiotics and HH. TOC to follow.  Expected Discharge Plan: Home/Self Care Barriers to Discharge: Continued Medical Work up  Expected Discharge Plan and Services Expected Discharge Plan: Home/Self Care In-house Referral: Clinical Social Work Living arrangements for the past 2 months: Single Family Home             DME Arranged: N/A DME Agency: NA  Prior Living Arrangements/Services Living arrangements for the past 2 months: [place] Lives with:: Spouse Patient language and need for interpreter reviewed:: Yes Need for Family Participation in Patient Care: No (Comment) Care giver support system in place?: Yes (comment) Criminal Activity/Legal Involvement Pertinent to Current Situation/Hospitalization: No - Comment as needed  Activities of Daily Living Home Assistive Devices/Equipment: None ADL Screening (condition at time of admission) Patient's cognitive ability adequate to safely complete daily activities?: Yes Is the patient deaf or have difficulty hearing?: No Does the patient have difficulty seeing, even when wearing glasses/contacts?: No Does the patient have difficulty concentrating, remembering, or making decisions?: No Patient able to express need for assistance with ADLs?: Yes Does the patient have difficulty dressing or bathing?: No Independently performs ADLs?: Yes (appropriate for developmental age) Does the patient have difficulty walking or climbing stairs?: No Weakness of  Legs: None Weakness of Arms/Hands: None  Emotional Assessment Orientation: : Oriented to Self, Oriented to Place, Oriented to  Time, Oriented to Situation Alcohol / Substance Use: Tobacco Use Psych Involvement: No (comment)  Admission diagnosis:  Bacteremia [R78.81] Streptococcal bacteremia [R78.81, B95.5] Patient Active Problem List   Diagnosis Date Noted   Sepsis due to Streptococcus species ([place]) 05/18/2021   Environmental allergies 03/25/2021   GAD (generalized anxiety disorder) 02/22/2021   Barrett's esophagus with dysplasia 10/17/2020   Exercise-induced asthma 08/23/2018   Recurrent cold sores 08/23/2018   Erectile dysfunction 08/23/2018   Bell's palsy 11/23/2017   Vaccine counseling 05/25/2017   Acute pain of left shoulder 05/04/2017   Sepsis due to Streptococcus agalactiae ([place]) 05/03/2017   History of Barrett's esophagus 01/20/2017   Sebaceous cyst 03/25/2016   Palpitations 05/09/2015   Aortic stenosis 05/09/2015   CLL (chronic lymphocytic leukemia) ([place]) 04/21/2015   Abnormal finding on EKG 04/21/2015   Degeneration of intervertebral disc of lumbar region 08/22/2014   Bilateral lumbar radiculopathy 06/21/2014   Abdominal pain, chronic, epigastric 11/28/2013   Fatigue 11/28/2013   Essential hypertension 11/28/2013   GERD (gastroesophageal reflux disease) 11/28/2013   PCP:  Shelda Pal, DO Pharmacy:   [id number] - [place], [address] [place] [address] Phone: [phone] Fax: [phone]  Readmission Risk Interventions No flowsheet data found.

## 2021-05-22 NOTE — Assessment & Plan Note (Signed)
-   Continue IV antibiotics - Plan for TEE tomorrow or Friday

## 2021-05-22 NOTE — Assessment & Plan Note (Signed)
Blood pressure controlled.  Continue carvedilol 

## 2021-05-22 NOTE — Progress Notes (Signed)
[place] for Infectious Disease   Reason for visit: Follow up on bacteremia  Interval History: repeat blood cultures remain ngtd; remains afebrile, WBC stable today at 28.9, decreased overall.  No issues with rash or diarrhea.  Day 5 total antibiotics  Physical Exam: Constitutional:  Vitals:   05/22/21 0626 05/22/21 1431  BP: (!) 123/91 (!) 134/99  Pulse: 86 91  Resp: 18 15  Temp: 98.4 F (36.9 C) 98.6 F (37 C)  SpO2: 95% 99%   patient appears in NAD Respiratory: Normal respiratory effort; CTA B Cardiovascular: RRR GI: soft, nt, nd  Review of Systems: Constitutional: negative for fevers and chills Gastrointestinal: negative for nausea and diarrhea  Lab Results  Component Value Date   WBC 28.9 (H) 05/22/2021   HGB 12.4 (L) 05/22/2021   HCT 37.4 (L) 05/22/2021   MCV 96.4 05/22/2021   PLT 97 (L) 05/22/2021    Lab Results  Component Value Date   CREATININE 1.08 05/22/2021   BUN 13 05/22/2021   NA 136 05/22/2021   K 4.1 05/22/2021   CL 102 05/22/2021   CO2 26 05/22/2021    Lab Results  Component Value Date   ALT 25 05/18/2021   AST 26 05/18/2021   ALKPHOS 94 05/18/2021     Microbiology: Recent Results (from the past 240 hour(s))  Culture, blood (routine x 2)     Status: Abnormal   Collection Time: 05/16/21  9:04 PM   Specimen: BLOOD  Result Value Ref Range Status   Specimen Description   Final    BLOOD RIGHT ANTECUBITAL Performed at [facility], [address] [place], [place] [address]    Special Requests   Final    BOTTLES DRAWN AEROBIC AND ANAEROBIC Blood Culture adequate volume Performed at [facility], [address] [place], [place] [address]    Culture  Setup Time   Final    GRAM POSITIVE COCCI ANAEROBIC BOTTLE ONLY CRITICAL VALUE NOTED.  VALUE IS CONSISTENT WITH PREVIOUSLY REPORTED AND CALLED VALUE. IN BOTH AEROBIC AND ANAEROBIC BOTTLES    Culture (A)  Final    STREPTOCOCCUS  CONSTELLATUS SUSCEPTIBILITIES PERFORMED ON PREVIOUS CULTURE WITHIN THE LAST 5 DAYS. Performed at [facility], [place] [address], [place], [place] [address]    Report Status 05/20/2021 FINAL  Final  Resp Panel by RT-PCR (Flu A&B, Covid) Nasopharyngeal Swab     Status: None   Collection Time: 05/16/21  9:14 PM   Specimen: Nasopharyngeal Swab; Nasopharyngeal(NP) swabs in vial transport medium  Result Value Ref Range Status   SARS Coronavirus 2 by RT PCR NEGATIVE NEGATIVE Final    Comment: (NOTE) SARS-CoV-2 target nucleic acids are NOT DETECTED.  The SARS-CoV-2 RNA is generally detectable in upper respiratory specimens during the acute phase of infection. The lowest concentration of SARS-CoV-2 viral copies this assay can detect is 138 copies/mL. A negative result does not preclude SARS-Cov-2 infection and should not be used as the sole basis for treatment or other patient management decisions. A negative result may occur with  improper specimen collection/handling, submission of specimen other than nasopharyngeal swab, presence of viral mutation(s) within the areas targeted by this assay, and inadequate number of viral copies(<138 copies/mL). A negative result must be combined with clinical observations, patient history, and epidemiological information. The expected result is Negative.  Fact Sheet for Patients:  EntrepreneurPulse.com.au  Fact Sheet for Healthcare Providers:  IncredibleEmployment.be  This test is no t yet approved  or cleared by the [place] and  has been authorized for detection and/or diagnosis of SARS-CoV-2 by FDA under an Emergency Use Authorization (EUA). This EUA will remain  in effect (meaning this test can be used) for the duration of the COVID-19 declaration under Section 564(b)(1) of the Act, 21 U.S.C.section 360bbb-3(b)(1), unless the authorization is terminated  or revoked sooner.       Influenza A by PCR  NEGATIVE NEGATIVE Final   Influenza B by PCR NEGATIVE NEGATIVE Final    Comment: (NOTE) The Xpert Xpress SARS-CoV-2/FLU/RSV plus assay is intended as an aid in the diagnosis of influenza from Nasopharyngeal swab specimens and should not be used as a sole basis for treatment. Nasal washings and aspirates are unacceptable for Xpert Xpress SARS-CoV-2/FLU/RSV testing.  Fact Sheet for Patients: EntrepreneurPulse.com.au  Fact Sheet for Healthcare Providers: IncredibleEmployment.be  This test is not yet approved or cleared by the [place] FDA and has been authorized for detection and/or diagnosis of SARS-CoV-2 by FDA under an Emergency Use Authorization (EUA). This EUA will remain in effect (meaning this test can be used) for the duration of the COVID-19 declaration under Section 564(b)(1) of the Act, 21 U.S.C. section 360bbb-3(b)(1), unless the authorization is terminated or revoked.  Performed at [facility], [address] [place], [place] [address]   Culture, blood (routine x 2)     Status: Abnormal   Collection Time: 05/16/21  9:15 PM   Specimen: BLOOD  Result Value Ref Range Status   Specimen Description   Final    BLOOD SITE NOT SPECIFIED Performed at [facility], [address] [place], [place] [address]    Special Requests   Final    BOTTLES DRAWN AEROBIC AND ANAEROBIC Blood Culture results may not be optimal due to an inadequate volume of blood received in culture bottles Performed at [facility], [address] [place], [place] [address]    Culture  Setup Time   Final    GRAM POSITIVE COCCI ANAEROBIC BOTTLE ONLY CRITICAL RESULT CALLED TO, READ BACK BY AND VERIFIED WITH: RN KELLY NEAL 05/18/21@00 :40 BY TW IN BOTH AEROBIC AND ANAEROBIC BOTTLES    Culture (A)  Final    STREPTOCOCCUS CONSTELLATUS Beta hemolytic streptococci are predictably susceptible to penicillin and other beta lactams.  Susceptibility testing not routinely performed. Performed at [facility], [place] [address], [place], [place] [address]    Report Status 05/20/2021 FINAL  Final   Organism ID, Bacteria STREPTOCOCCUS CONSTELLATUS  Final      Susceptibility   Streptococcus constellatus - MIC*    PENICILLIN <=0.06 SENSITIVE Sensitive     CEFTRIAXONE 0.25 SENSITIVE Sensitive     ERYTHROMYCIN <=0.12 SENSITIVE Sensitive     LEVOFLOXACIN <=0.25 SENSITIVE Sensitive     VANCOMYCIN 0.5 SENSITIVE Sensitive     * STREPTOCOCCUS CONSTELLATUS  Blood Culture ID Panel (Reflexed)     Status: Abnormal   Collection Time: 05/16/21  9:15 PM  Result Value Ref Range Status   Enterococcus faecalis NOT DETECTED NOT DETECTED Final   Enterococcus Faecium NOT DETECTED NOT DETECTED Final   Listeria monocytogenes NOT DETECTED NOT DETECTED Final   Staphylococcus species NOT DETECTED NOT DETECTED Final   Staphylococcus aureus (BCID) NOT DETECTED NOT DETECTED Final   Staphylococcus epidermidis NOT DETECTED NOT DETECTED Final   Staphylococcus lugdunensis NOT DETECTED NOT DETECTED Final   Streptococcus species DETECTED (A) NOT DETECTED Final    Comment:  Not Enterococcus species, Streptococcus agalactiae, Streptococcus pyogenes, or Streptococcus pneumoniae. CRITICAL RESULT CALLED TO, READ BACK BY AND VERIFIED WITH: RN KELLY NEAL 05/18/21@00 :40 BY TW    Streptococcus agalactiae NOT DETECTED NOT DETECTED Final   Streptococcus pneumoniae NOT DETECTED NOT DETECTED Final   Streptococcus pyogenes NOT DETECTED NOT DETECTED Final   A.calcoaceticus-baumannii NOT DETECTED NOT DETECTED Final   Bacteroides fragilis NOT DETECTED NOT DETECTED Final   Enterobacterales NOT DETECTED NOT DETECTED Final   Enterobacter cloacae complex NOT DETECTED NOT DETECTED Final   Escherichia coli NOT DETECTED NOT DETECTED Final   Klebsiella aerogenes NOT DETECTED NOT DETECTED Final   Klebsiella oxytoca NOT DETECTED NOT DETECTED Final   Klebsiella pneumoniae  NOT DETECTED NOT DETECTED Final   Proteus species NOT DETECTED NOT DETECTED Final   Salmonella species NOT DETECTED NOT DETECTED Final   Serratia marcescens NOT DETECTED NOT DETECTED Final   Haemophilus influenzae NOT DETECTED NOT DETECTED Final   Neisseria meningitidis NOT DETECTED NOT DETECTED Final   Pseudomonas aeruginosa NOT DETECTED NOT DETECTED Final   Stenotrophomonas maltophilia NOT DETECTED NOT DETECTED Final   Candida albicans NOT DETECTED NOT DETECTED Final   Candida auris NOT DETECTED NOT DETECTED Final   Candida glabrata NOT DETECTED NOT DETECTED Final   Candida krusei NOT DETECTED NOT DETECTED Final   Candida parapsilosis NOT DETECTED NOT DETECTED Final   Candida tropicalis NOT DETECTED NOT DETECTED Final   Cryptococcus neoformans/gattii NOT DETECTED NOT DETECTED Final    Comment: Performed at [facility], [address], [place], [place] [address]  Urine Culture     Status: None   Collection Time: 05/16/21 11:15 PM   Specimen: Urine, Clean Catch  Result Value Ref Range Status   Specimen Description   Final    URINE, CLEAN CATCH Performed at [facility], [address] [place], [place] [address]    Special Requests   Final    NONE Performed at [facility], [address] [place], [place] [address]    Culture   Final    NO GROWTH Performed at [facility], [place] [address] [place], [place] [address]    Report Status 05/18/2021 FINAL  Final  Blood culture (routine x 2)     Status: Abnormal   Collection Time: 05/18/21  9:38 AM   Specimen: BLOOD  Result Value Ref Range Status   Specimen Description   Final    BLOOD LEFT ANTECUBITAL Performed at [facility], [address] [place], [place] [address]    Special Requests   Final    BOTTLES DRAWN AEROBIC AND ANAEROBIC Blood Culture adequate volume Performed at [facility], [address] [place], [place] [address]    Culture  Setup Time   Final     GRAM POSITIVE COCCI IN PAIRS AND CHAINS IN BOTH AEROBIC AND ANAEROBIC BOTTLES CRITICAL VALUE NOTED.  VALUE IS CONSISTENT WITH PREVIOUSLY REPORTED AND CALLED VALUE.    Culture (A)  Final    STREPTOCOCCUS CONSTELLATUS SUSCEPTIBILITIES PERFORMED ON PREVIOUS CULTURE WITHIN THE LAST 5 DAYS. Performed at [facility], [place] [address] [place], [place] [address]    Report Status 05/21/2021 FINAL  Final  Blood culture (routine x 2)     Status: Abnormal   Collection Time: 05/18/21  9:48 AM   Specimen: BLOOD LEFT HAND  Result Value Ref Range Status   Specimen Description   Final  BLOOD LEFT HAND Performed at [facility], [address] [place], [place] [address]    Special Requests   Final    BOTTLES DRAWN AEROBIC AND ANAEROBIC Blood Culture adequate volume Performed at [facility], [address] [place], [place] [address]    Culture  Setup Time   Final    GRAM POSITIVE COCCI IN PAIRS AND CHAINS IN BOTH AEROBIC AND ANAEROBIC BOTTLES CRITICAL VALUE NOTED.  VALUE IS CONSISTENT WITH PREVIOUSLY REPORTED AND CALLED VALUE.    Culture (A)  Final    STREPTOCOCCUS CONSTELLATUS SUSCEPTIBILITIES PERFORMED ON PREVIOUS CULTURE WITHIN THE LAST 5 DAYS. Performed at [facility], [place] [address], [place], [place] [address]    Report Status 05/21/2021 FINAL  Final  Resp Panel by RT-PCR (Flu A&B, Covid) Nasopharyngeal Swab     Status: None   Collection Time: 05/18/21 10:24 AM   Specimen: Nasopharyngeal Swab; Nasopharyngeal(NP) swabs in vial transport medium  Result Value Ref Range Status   SARS Coronavirus 2 by RT PCR NEGATIVE NEGATIVE Final    Comment: (NOTE) SARS-CoV-2 target nucleic acids are NOT DETECTED.  The SARS-CoV-2 RNA is generally detectable in upper respiratory specimens during the acute phase of infection. The lowest concentration of SARS-CoV-2 viral copies this assay can detect is 138 copies/mL. A negative result does not preclude  SARS-Cov-2 infection and should not be used as the sole basis for treatment or other patient management decisions. A negative result may occur with  improper specimen collection/handling, submission of specimen other than nasopharyngeal swab, presence of viral mutation(s) within the areas targeted by this assay, and inadequate number of viral copies(<138 copies/mL). A negative result must be combined with clinical observations, patient history, and epidemiological information. The expected result is Negative.  Fact Sheet for Patients:  EntrepreneurPulse.com.au  Fact Sheet for Healthcare Providers:  IncredibleEmployment.be  This test is no t yet approved or cleared by the [place] FDA and  has been authorized for detection and/or diagnosis of SARS-CoV-2 by FDA under an Emergency Use Authorization (EUA). This EUA will remain  in effect (meaning this test can be used) for the duration of the COVID-19 declaration under Section 564(b)(1) of the Act, 21 U.S.C.section 360bbb-3(b)(1), unless the authorization is terminated  or revoked sooner.       Influenza A by PCR NEGATIVE NEGATIVE Final   Influenza B by PCR NEGATIVE NEGATIVE Final    Comment: (NOTE) The Xpert Xpress SARS-CoV-2/FLU/RSV plus assay is intended as an aid in the diagnosis of influenza from Nasopharyngeal swab specimens and should not be used as a sole basis for treatment. Nasal washings and aspirates are unacceptable for Xpert Xpress SARS-CoV-2/FLU/RSV testing.  Fact Sheet for Patients: EntrepreneurPulse.com.au  Fact Sheet for Healthcare Providers: IncredibleEmployment.be  This test is not yet approved or cleared by the [place] FDA and has been authorized for detection and/or diagnosis of SARS-CoV-2 by FDA under an Emergency Use Authorization (EUA). This EUA will remain in effect (meaning this test can be used) for the duration of  the COVID-19 declaration under Section 564(b)(1) of the Act, 21 U.S.C. section 360bbb-3(b)(1), unless the authorization is terminated or revoked.  Performed at [facility], [address] [place], [place] [address]   Urine Culture     Status: None   Collection Time: 05/18/21 11:56 AM   Specimen: Urine, Clean Catch  Result Value Ref Range Status   Specimen Description   Final    URINE,  CLEAN CATCH Performed at [facility], [address] [place], [place] [address]    Special Requests   Final    NONE Performed at [facility], [address] [place], [place] [address]    Culture   Final    NO GROWTH Performed at [facility], [place] [address], [place], [place] [address]    Report Status 05/19/2021 FINAL  Final  Culture, blood (routine x 2)     Status: None (Preliminary result)   Collection Time: 05/20/21  1:47 PM   Specimen: BLOOD  Result Value Ref Range Status   Specimen Description   Final    BLOOD RIGHT ANTECUBITAL Performed at [facility] [address] [place], [place] [address]    Special Requests   Final    BOTTLES DRAWN AEROBIC ONLY Blood Culture adequate volume Performed at [facility] [address] [place], [place] [address]    Culture   Final    NO GROWTH 2 DAYS Performed at [facility] [address], [place], [place] [address]    Report Status PENDING  Incomplete  Culture, blood (routine x 2)     Status: None (Preliminary result)   Collection Time: 05/20/21  1:52 PM   Specimen: BLOOD  Result Value Ref Range Status   Specimen Description   Final    BLOOD LEFT ANTECUBITAL Performed at [place] [address] [place], [place] [address]    Special Requests   Final    BOTTLES DRAWN AEROBIC ONLY Blood Culture adequate volume Performed at [facility] [address], [place], [place] [address]    Culture   Final    NO  GROWTH 2 DAYS Performed at [facility] [address] [place], [place] [address]    Report Status PENDING  Incomplete    Impression/Plan:  1. Bacteremia - 4 positive sets positive (6/8 bottles). He feels better though somewhat weak and fatigued.  Repeat cultures are ngtd.  Plan for TEE on Friday and if ok, he will be able to be discharged on about 1 week additional of oral amoxicillin.  Discussed plan with the patient and he understands will need to stay until Friday and is in agreement with the plan.   2.  Leukocytosis - improved overall but remains elevated.  From #1 partially but with CLL, not a good measure for infection.     3.  Fever - fever has resolved with last fever on 10/22.  Will continue to monitor.

## 2021-05-22 NOTE — H&P (View-Only) (Signed)
Todd Novak for Infectious Disease   Reason for visit: Follow up on bacteremia  Interval History: repeat blood cultures remain ngtd; remains afebrile, WBC stable today at 28.9, decreased overall.  No issues with rash or diarrhea.  Day 5 total antibiotics  Physical Exam: Constitutional:  Vitals:   05/22/21 0626 05/22/21 1431  BP: (!) 123/91 (!) 134/99  Pulse: 86 91  Resp: 18 15  Temp: 98.4 F (36.9 C) 98.6 F (37 C)  SpO2: 95% 99%   patient appears in NAD Respiratory: Normal respiratory effort; CTA B Cardiovascular: RRR GI: soft, nt, nd  Review of Systems: Constitutional: negative for fevers and chills Gastrointestinal: negative for nausea and diarrhea  Lab Results  Component Value Date   WBC 28.9 (H) 05/22/2021   HGB 12.4 (L) 05/22/2021   HCT 37.4 (L) 05/22/2021   MCV 96.4 05/22/2021   PLT 97 (L) 05/22/2021    Lab Results  Component Value Date   CREATININE 1.08 05/22/2021   BUN 13 05/22/2021   NA 136 05/22/2021   K 4.1 05/22/2021   CL 102 05/22/2021   CO2 26 05/22/2021    Lab Results  Component Value Date   ALT 25 05/18/2021   AST 26 05/18/2021   ALKPHOS 94 05/18/2021     Microbiology: Recent Results (from the past 240 hour(s))  Culture, blood (routine x 2)     Status: Abnormal   Collection Time: 05/16/21  9:04 PM   Specimen: BLOOD  Result Value Ref Range Status   Specimen Description   Final    BLOOD RIGHT ANTECUBITAL Performed at [facility], [address] [place], [place] [address]    Special Requests   Final    BOTTLES DRAWN AEROBIC AND ANAEROBIC Blood Culture adequate volume Performed at [facility], [address] [place], [place] [address]    Culture  Setup Time   Final    GRAM POSITIVE COCCI ANAEROBIC BOTTLE ONLY CRITICAL VALUE NOTED.  VALUE IS CONSISTENT WITH PREVIOUSLY REPORTED AND CALLED VALUE. IN BOTH AEROBIC AND ANAEROBIC BOTTLES    Culture (A)  Final    STREPTOCOCCUS  CONSTELLATUS SUSCEPTIBILITIES PERFORMED ON PREVIOUS CULTURE WITHIN THE LAST 5 DAYS. Performed at [facility], [place] [address] [place], [place] [address]    Report Status 05/20/2021 FINAL  Final  Resp Panel by RT-PCR (Flu A&B, Covid) Nasopharyngeal Swab     Status: None   Collection Time: 05/16/21  9:14 PM   Specimen: Nasopharyngeal Swab; Nasopharyngeal(NP) swabs in vial transport medium  Result Value Ref Range Status   SARS Coronavirus 2 by RT PCR NEGATIVE NEGATIVE Final    Comment: (NOTE) SARS-CoV-2 target nucleic acids are NOT DETECTED.  The SARS-CoV-2 RNA is generally detectable in upper respiratory specimens during the acute phase of infection. The lowest concentration of SARS-CoV-2 viral copies this assay can detect is 138 copies/mL. A negative result does not preclude SARS-Cov-2 infection and should not be used as the sole basis for treatment or other patient management decisions. A negative result may occur with  improper specimen collection/handling, submission of specimen other than nasopharyngeal swab, presence of viral mutation(s) within the areas targeted by this assay, and inadequate number of viral copies(<138 copies/mL). A negative result must be combined with clinical observations, patient history, and epidemiological information. The expected result is Negative.  Fact Sheet for Patients:  EntrepreneurPulse.com.au  Fact Sheet for Healthcare Providers:  IncredibleEmployment.be  This test is no t yet approved  or cleared by the [place] and  has been authorized for detection and/or diagnosis of SARS-CoV-2 by FDA under an Emergency Use Authorization (EUA). This EUA will remain  in effect (meaning this test can be used) for the duration of the COVID-19 declaration under Section 564(b)(1) of the Act, 21 U.S.C.section 360bbb-3(b)(1), unless the authorization is terminated  or revoked sooner.       Influenza A by PCR  NEGATIVE NEGATIVE Final   Influenza B by PCR NEGATIVE NEGATIVE Final    Comment: (NOTE) The Xpert Xpress SARS-CoV-2/FLU/RSV plus assay is intended as an aid in the diagnosis of influenza from Nasopharyngeal swab specimens and should not be used as a sole basis for treatment. Nasal washings and aspirates are unacceptable for Xpert Xpress SARS-CoV-2/FLU/RSV testing.  Fact Sheet for Patients: EntrepreneurPulse.com.au  Fact Sheet for Healthcare Providers: IncredibleEmployment.be  This test is not yet approved or cleared by the [place] FDA and has been authorized for detection and/or diagnosis of SARS-CoV-2 by FDA under an Emergency Use Authorization (EUA). This EUA will remain in effect (meaning this test can be used) for the duration of the COVID-19 declaration under Section 564(b)(1) of the Act, 21 U.S.C. section 360bbb-3(b)(1), unless the authorization is terminated or revoked.  Performed at [facility], [address] [place], [place] [address]   Culture, blood (routine x 2)     Status: Abnormal   Collection Time: 05/16/21  9:15 PM   Specimen: BLOOD  Result Value Ref Range Status   Specimen Description   Final    BLOOD SITE NOT SPECIFIED Performed at [facility], [address] [place], [place] [address]    Special Requests   Final    BOTTLES DRAWN AEROBIC AND ANAEROBIC Blood Culture results may not be optimal due to an inadequate volume of blood received in culture bottles Performed at [facility], [address] [place], [place] [address]    Culture  Setup Time   Final    GRAM POSITIVE COCCI ANAEROBIC BOTTLE ONLY CRITICAL RESULT CALLED TO, READ BACK BY AND VERIFIED WITH: RN KELLY NEAL 05/18/21@00 :40 BY TW IN BOTH AEROBIC AND ANAEROBIC BOTTLES    Culture (A)  Final    STREPTOCOCCUS CONSTELLATUS Beta hemolytic streptococci are predictably susceptible to penicillin and other beta lactams.  Susceptibility testing not routinely performed. Performed at [facility], [place] [address], [place], [place] [address]    Report Status 05/20/2021 FINAL  Final   Organism ID, Bacteria STREPTOCOCCUS CONSTELLATUS  Final      Susceptibility   Streptococcus constellatus - MIC*    PENICILLIN <=0.06 SENSITIVE Sensitive     CEFTRIAXONE 0.25 SENSITIVE Sensitive     ERYTHROMYCIN <=0.12 SENSITIVE Sensitive     LEVOFLOXACIN <=0.25 SENSITIVE Sensitive     VANCOMYCIN 0.5 SENSITIVE Sensitive     * STREPTOCOCCUS CONSTELLATUS  Blood Culture ID Panel (Reflexed)     Status: Abnormal   Collection Time: 05/16/21  9:15 PM  Result Value Ref Range Status   Enterococcus faecalis NOT DETECTED NOT DETECTED Final   Enterococcus Faecium NOT DETECTED NOT DETECTED Final   Listeria monocytogenes NOT DETECTED NOT DETECTED Final   Staphylococcus species NOT DETECTED NOT DETECTED Final   Staphylococcus aureus (BCID) NOT DETECTED NOT DETECTED Final   Staphylococcus epidermidis NOT DETECTED NOT DETECTED Final   Staphylococcus lugdunensis NOT DETECTED NOT DETECTED Final   Streptococcus species DETECTED (A) NOT DETECTED Final    Comment:  Not Enterococcus species, Streptococcus agalactiae, Streptococcus pyogenes, or Streptococcus pneumoniae. CRITICAL RESULT CALLED TO, READ BACK BY AND VERIFIED WITH: RN KELLY NEAL 05/18/21@00 :40 BY TW    Streptococcus agalactiae NOT DETECTED NOT DETECTED Final   Streptococcus pneumoniae NOT DETECTED NOT DETECTED Final   Streptococcus pyogenes NOT DETECTED NOT DETECTED Final   A.calcoaceticus-baumannii NOT DETECTED NOT DETECTED Final   Bacteroides fragilis NOT DETECTED NOT DETECTED Final   Enterobacterales NOT DETECTED NOT DETECTED Final   Enterobacter cloacae complex NOT DETECTED NOT DETECTED Final   Escherichia coli NOT DETECTED NOT DETECTED Final   Klebsiella aerogenes NOT DETECTED NOT DETECTED Final   Klebsiella oxytoca NOT DETECTED NOT DETECTED Final   Klebsiella pneumoniae  NOT DETECTED NOT DETECTED Final   Proteus species NOT DETECTED NOT DETECTED Final   Salmonella species NOT DETECTED NOT DETECTED Final   Serratia marcescens NOT DETECTED NOT DETECTED Final   Haemophilus influenzae NOT DETECTED NOT DETECTED Final   Neisseria meningitidis NOT DETECTED NOT DETECTED Final   Pseudomonas aeruginosa NOT DETECTED NOT DETECTED Final   Stenotrophomonas maltophilia NOT DETECTED NOT DETECTED Final   Candida albicans NOT DETECTED NOT DETECTED Final   Candida auris NOT DETECTED NOT DETECTED Final   Candida glabrata NOT DETECTED NOT DETECTED Final   Candida krusei NOT DETECTED NOT DETECTED Final   Candida parapsilosis NOT DETECTED NOT DETECTED Final   Candida tropicalis NOT DETECTED NOT DETECTED Final   Cryptococcus neoformans/gattii NOT DETECTED NOT DETECTED Final    Comment: Performed at [facility], [address], [place], [place] [address]  Urine Culture     Status: None   Collection Time: 05/16/21 11:15 PM   Specimen: Urine, Clean Catch  Result Value Ref Range Status   Specimen Description   Final    URINE, CLEAN CATCH Performed at [facility], [address] [place], [place] [address]    Special Requests   Final    NONE Performed at [facility], [address] [place], [place] [address]    Culture   Final    NO GROWTH Performed at [facility], [place] [address], [place], [place] [address]    Report Status 05/18/2021 FINAL  Final  Blood culture (routine x 2)     Status: Abnormal   Collection Time: 05/18/21  9:38 AM   Specimen: BLOOD  Result Value Ref Range Status   Specimen Description   Final    BLOOD LEFT ANTECUBITAL Performed at [facility], [address] [place], [place] [address]    Special Requests   Final    BOTTLES DRAWN AEROBIC AND ANAEROBIC Blood Culture adequate volume Performed at [facility], [address] [place], [place] [address]    Culture  Setup Time   Final     GRAM POSITIVE COCCI IN PAIRS AND CHAINS IN BOTH AEROBIC AND ANAEROBIC BOTTLES CRITICAL VALUE NOTED.  VALUE IS CONSISTENT WITH PREVIOUSLY REPORTED AND CALLED VALUE.    Culture (A)  Final    STREPTOCOCCUS CONSTELLATUS SUSCEPTIBILITIES PERFORMED ON PREVIOUS CULTURE WITHIN THE LAST 5 DAYS. Performed at [facility], [place] [address] [place], [place] [address]    Report Status 05/21/2021 FINAL  Final  Blood culture (routine x 2)     Status: Abnormal   Collection Time: 05/18/21  9:48 AM   Specimen: BLOOD LEFT HAND  Result Value Ref Range Status   Specimen Description   Final  BLOOD LEFT HAND Performed at [facility], [address] [place], [place] [address]    Special Requests   Final    BOTTLES DRAWN AEROBIC AND ANAEROBIC Blood Culture adequate volume Performed at [facility], [address] [place], [place] [address]    Culture  Setup Time   Final    GRAM POSITIVE COCCI IN PAIRS AND CHAINS IN BOTH AEROBIC AND ANAEROBIC BOTTLES CRITICAL VALUE NOTED.  VALUE IS CONSISTENT WITH PREVIOUSLY REPORTED AND CALLED VALUE.    Culture (A)  Final    STREPTOCOCCUS CONSTELLATUS SUSCEPTIBILITIES PERFORMED ON PREVIOUS CULTURE WITHIN THE LAST 5 DAYS. Performed at [facility], [place] [address] [place],  [address]    Report Status 05/21/2021 FINAL  Final  Resp Panel by RT-PCR (Flu A&B, Covid) Nasopharyngeal Swab     Status: None   Collection Time: 05/18/21 10:24 AM   Specimen: Nasopharyngeal Swab; Nasopharyngeal(NP) swabs in vial transport medium  Result Value Ref Range Status   SARS Coronavirus 2 by RT PCR NEGATIVE NEGATIVE Final    Comment: (NOTE) SARS-CoV-2 target nucleic acids are NOT DETECTED.  The SARS-CoV-2 RNA is generally detectable in upper respiratory specimens during the acute phase of infection. The lowest concentration of SARS-CoV-2 viral copies this assay can detect is 138 copies/mL. A negative result does not preclude  SARS-Cov-2 infection and should not be used as the sole basis for treatment or other patient management decisions. A negative result may occur with  improper specimen collection/handling, submission of specimen other than nasopharyngeal swab, presence of viral mutation(s) within the areas targeted by this assay, and inadequate number of viral copies(<138 copies/mL). A negative result must be combined with clinical observations, patient history, and epidemiological information. The expected result is Negative.  Fact Sheet for Patients:  EntrepreneurPulse.com.au  Fact Sheet for Healthcare Providers:  IncredibleEmployment.be  This test is no t yet approved or cleared by the [place] FDA and  has been authorized for detection and/or diagnosis of SARS-CoV-2 by FDA under an Emergency Use Authorization (EUA). This EUA will remain  in effect (meaning this test can be used) for the duration of the COVID-19 declaration under Section 564(b)(1) of the Act, 21 U.S.C.section 360bbb-3(b)(1), unless the authorization is terminated  or revoked sooner.       Influenza A by PCR NEGATIVE NEGATIVE Final   Influenza B by PCR NEGATIVE NEGATIVE Final    Comment: (NOTE) The Xpert Xpress SARS-CoV-2/FLU/RSV plus assay is intended as an aid in the diagnosis of influenza from Nasopharyngeal swab specimens and should not be used as a sole basis for treatment. Nasal washings and aspirates are unacceptable for Xpert Xpress SARS-CoV-2/FLU/RSV testing.  Fact Sheet for Patients: EntrepreneurPulse.com.au  Fact Sheet for Healthcare Providers: IncredibleEmployment.be  This test is not yet approved or cleared by the [place] FDA and has been authorized for detection and/or diagnosis of SARS-CoV-2 by FDA under an Emergency Use Authorization (EUA). This EUA will remain in effect (meaning this test can be used) for the duration of  the COVID-19 declaration under Section 564(b)(1) of the Act, 21 U.S.C. section 360bbb-3(b)(1), unless the authorization is terminated or revoked.  Performed at [facility], [address] [place], [place] [address]   Urine Culture     Status: None   Collection Time: 05/18/21 11:56 AM   Specimen: Urine, Clean Catch  Result Value Ref Range Status   Specimen Description   Final    URINE,  CLEAN CATCH Performed at [facility], [address] [place], [place] [address]    Special Requests   Final    NONE Performed at [facility], [address] [place], [place] [address]    Culture   Final    NO GROWTH Performed at [facility], [place] [address] [place], [place] [address]    Report Status 05/19/2021 FINAL  Final  Culture, blood (routine x 2)     Status: None (Preliminary result)   Collection Time: 05/20/21  1:47 PM   Specimen: BLOOD  Result Value Ref Range Status   Specimen Description   Final    BLOOD RIGHT ANTECUBITAL Performed at [place] [address] [place], [place] [address]    Special Requests   Final    BOTTLES DRAWN AEROBIC ONLY Blood Culture adequate volume Performed at [place] [address] [place], [place] [address]    Culture   Final    NO GROWTH 2 DAYS Performed at [place] [address], [place], [place] [address]    Report Status PENDING  Incomplete  Culture, blood (routine x 2)     Status: None (Preliminary result)   Collection Time: 05/20/21  1:52 PM   Specimen: BLOOD  Result Value Ref Range Status   Specimen Description   Final    BLOOD LEFT ANTECUBITAL Performed at [place] [address], [place], [place] [address]    Special Requests   Final    BOTTLES DRAWN AEROBIC ONLY Blood Culture adequate volume Performed at [place] [address] [place], [place] [address]    Culture   Final    NO  GROWTH 2 DAYS Performed at [place] [address], [place],  [address]    Report Status PENDING  Incomplete    Impression/Plan:  1. Bacteremia - 4 positive sets positive (6/8 bottles). He feels better though somewhat weak and fatigued.  Repeat cultures are ngtd.  Plan for TEE on Friday and if ok, he will be able to be discharged on about 1 week additional of oral amoxicillin.  Discussed plan with the patient and he understands will need to stay until Friday and is in agreement with the plan.   2.  Leukocytosis - improved overall but remains elevated.  From #1 partially but with CLL, not a good measure for infection.     3.  Fever - fever has resolved with last fever on 10/22.  Will continue to monitor.

## 2021-05-22 NOTE — Assessment & Plan Note (Signed)
Continue pantoprazole. °

## 2021-05-22 NOTE — Progress Notes (Signed)
Progress Note    Todd Novak   YBO:[id number]  DOB: [birth year]  DOA: 05/18/2021     3 Date of Service: 05/22/2021     Brief summary: Todd Novak was admitted to the hospital with the working diagnosis of gram positive bacteremia, streptococcus constellatus.     ID consulted with recommendations for further work up with TEE, antibiotic therapy changed to penicillin.        Assessment and Plan * Sepsis due to Streptococcus species ([place]) - Continue IV antibiotics - Plan for TEE tomorrow or Friday  Essential hypertension Blood pressure controlled - Continue carvedilol  CLL (chronic lymphocytic leukemia) (HCC)    History of Barrett's esophagus - Continue pantoprazole  Hyperlipidemia - Continue Crestor     Subjective:  No new fever, confusion, rashes, lesions on the hands or feet.  No focal weakness or numbness.  No speech changes.  Objective Vitals:   05/21/21 1452 05/21/21 2029 05/22/21 0626 05/22/21 1431  BP: 126/73 (!) 122/91 (!) 123/91 (!) 134/99  Pulse: 84 99 86 91  Resp: 16 18 18 15   Temp: 98.9 F (37.2 C) 98.8 F (37.1 C) 98.4 F (36.9 C) 98.6 F (37 C)  TempSrc: Oral Oral Oral Oral  SpO2: 97% 98% 95% 99%  Weight:      Height:       89.5 kg  Vital signs were reviewed and unremarkable.   Exam Physical Exam Constitutional:      General: He is not in acute distress. HENT:     Nose: No nasal deformity or rhinorrhea.     Mouth/Throat:     Lips: Pink. No lesions.     Mouth: Mucous membranes are moist. No oral lesions.     Dentition: Normal dentition.     Pharynx: Oropharynx is clear. No posterior oropharyngeal erythema.  Eyes:     General: Lids are normal. Gaze aligned appropriately.     Extraocular Movements: Extraocular movements intact.     Conjunctiva/sclera: Conjunctivae normal.  Cardiovascular:     Rate and Rhythm: Normal rate and regular rhythm.     Pulses:          Radial pulses are 2+ on the right side and 2+ on the left  side.     Heart sounds: Normal heart sounds, S1 normal and S2 normal. No murmur heard. Pulmonary:     Effort: Pulmonary effort is normal. No respiratory distress.     Breath sounds: No wheezing or rales.  Abdominal:     General: There is no distension.     Palpations: Abdomen is soft.     Tenderness: There is no abdominal tenderness. There is no guarding or rebound.  Musculoskeletal:     Right lower leg: No edema.     Left lower leg: No edema.  Skin:    General: Skin is warm and dry.     Findings: No lesion or rash.  Neurological:     Mental Status: He is alert and oriented to person, place, and time.     Motor: No weakness.  Psychiatric:        Attention and Perception: Attention normal.        Mood and Affect: Mood and affect normal.        Behavior: Behavior is cooperative.        Cognition and Memory: Memory normal.        Judgment: Judgment normal.      Labs / Other Information My review of labs, imaging,  notes and other tests is significant for Elevated white count, thrombocytopenia, stable     Disposition Plan: Status is: Inpatient  Remains inpatient appropriate because: Ongoing diagnostic work-up to determine  Outpatient treatment is still pending.        Time spent: 25 minutes [facility] 05/22/2021, 7:41 PM

## 2021-05-22 NOTE — Hospital Course (Addendum)
Mr. Crays is a 55 y.o. M with HTN, CLL, AS who presented with positive blood cultures.    Came to ER few days prior to admission with fever, body aches.  Appeared benign, discharged but cultures returned positive, was called back.  Cultures with Strep constellatus in 2/2, viridans strep, presumed oral source.    ID consulted with recommendations for further work up with TEE, antibiotic therapy changed to penicillin.      10/22: admitted and started on Rocephin 10/24: ID consulted, Abx narrowed, TEE recommended 10/27: still waiting on TEE

## 2021-05-23 NOTE — Assessment & Plan Note (Signed)
Continue pantoprazole. °

## 2021-05-23 NOTE — Progress Notes (Signed)
    [facility] has been requested to perform a transesophageal echocardiogram on Todd Novak for bacteremia.  After careful review of history and examination, the risks and benefits of transesophageal echocardiogram have been explained including risks of esophageal damage, perforation (1:10,000 risk), bleeding, pharyngeal hematoma as well as other potential complications associated with conscious sedation including aspiration, arrhythmia, respiratory failure and death. Alternatives to treatment were discussed, questions were answered. Patient is willing to proceed.   Tami Lin Thelmer Legler, [place]  05/23/2021 3:21 PM

## 2021-05-23 NOTE — Plan of Care (Deleted)
Problem: Fluid Volume: Goal: Hemodynamic stability will improve 05/23/2021 0454 by Josepha Pigg, RN Outcome: Progressing 05/23/2021 0323 by Josepha Pigg, RN Outcome: Progressing   Problem: Clinical Measurements: Goal: Diagnostic test results will improve 05/23/2021 0454 by Josepha Pigg, RN Outcome: Progressing 05/23/2021 0323 by Josepha Pigg, RN Outcome: Progressing Goal: Signs and symptoms of infection will decrease 05/23/2021 0454 by Josepha Pigg, RN Outcome: Progressing 05/23/2021 0323 by Josepha Pigg, RN Outcome: Progressing   Problem: Respiratory: Goal: Ability to maintain adequate ventilation will improve 05/23/2021 0454 by Josepha Pigg, RN Outcome: Progressing 05/23/2021 0323 by Josepha Pigg, RN Outcome: Progressing   Problem: Education: Goal: Knowledge of General Education information will improve Description: Including pain rating scale, medication(s)/side effects and non-pharmacologic comfort measures Outcome: Progressing   Problem: Health Behavior/Discharge Planning: Goal: Ability to manage health-related needs will improve Outcome: Progressing   Problem: Clinical Measurements: Goal: Ability to maintain clinical measurements within normal limits will improve Outcome: Progressing Goal: Will remain free from infection Outcome: Progressing Goal: Diagnostic test results will improve Outcome: Progressing Goal: Respiratory complications will improve Outcome: Progressing Goal: Cardiovascular complication will be avoided Outcome: Progressing   Problem: Activity: Goal: Risk for activity intolerance will decrease Outcome: Progressing   Problem: Nutrition: Goal: Adequate nutrition will be maintained Outcome: Progressing   Problem: Coping: Goal: Level of anxiety will decrease Outcome: Progressing   Problem: Elimination: Goal: Will not experience complications related to bowel motility Outcome:  Progressing Goal: Will not experience complications related to urinary retention Outcome: Progressing   Problem: Pain Managment: Goal: General experience of comfort will improve Outcome: Progressing   Problem: Safety: Goal: Ability to remain free from injury will improve Outcome: Progressing   Problem: Skin Integrity: Goal: Risk for impaired skin integrity will decrease Outcome: Progressing   Problem: Fluid Volume: Goal: Hemodynamic stability will improve 05/23/2021 0454 by Josepha Pigg, RN Outcome: Progressing 05/23/2021 0323 by Josepha Pigg, RN Outcome: Progressing   Problem: Clinical Measurements: Goal: Diagnostic test results will improve 05/23/2021 0454 by Josepha Pigg, RN Outcome: Progressing 05/23/2021 0323 by Josepha Pigg, RN Outcome: Progressing Goal: Signs and symptoms of infection will decrease 05/23/2021 0454 by Josepha Pigg, RN Outcome: Progressing 05/23/2021 0323 by Josepha Pigg, RN Outcome: Progressing   Problem: Respiratory: Goal: Ability to maintain adequate ventilation will improve 05/23/2021 0454 by Josepha Pigg, RN Outcome: Progressing 05/23/2021 0323 by Josepha Pigg, RN Outcome: Progressing   Problem: Education: Goal: Knowledge of General Education information will improve Description: Including pain rating scale, medication(s)/side effects and non-pharmacologic comfort measures Outcome: Progressing   Problem: Health Behavior/Discharge Planning: Goal: Ability to manage health-related needs will improve Outcome: Progressing   Problem: Clinical Measurements: Goal: Ability to maintain clinical measurements within normal limits will improve Outcome: Progressing Goal: Will remain free from infection Outcome: Progressing Goal: Diagnostic test results will improve Outcome: Progressing Goal: Respiratory complications will improve Outcome: Progressing Goal: Cardiovascular complication will be  avoided Outcome: Progressing   Problem: Activity: Goal: Risk for activity intolerance will decrease Outcome: Progressing   Problem: Nutrition: Goal: Adequate nutrition will be maintained Outcome: Progressing   Problem: Coping: Goal: Level of anxiety will decrease Outcome: Progressing   Problem: Elimination: Goal: Will not experience complications related to bowel motility Outcome: Progressing Goal: Will not experience complications related to urinary retention Outcome: Progressing   Problem: Pain Managment: Goal: General experience of comfort will improve Outcome: Progressing   Problem: Safety: Goal: Ability to remain free from  injury will improve Outcome: Progressing   Problem: Skin Integrity: Goal: Risk for impaired skin integrity will decrease Outcome: Progressing   Problem: Fluid Volume: Goal: Hemodynamic stability will improve 05/23/2021 0454 by Josepha Pigg, RN Outcome: Progressing 05/23/2021 0323 by Josepha Pigg, RN Outcome: Progressing   Problem: Clinical Measurements: Goal: Diagnostic test results will improve 05/23/2021 0454 by Josepha Pigg, RN Outcome: Progressing 05/23/2021 0323 by Josepha Pigg, RN Outcome: Progressing Goal: Signs and symptoms of infection will decrease 05/23/2021 0454 by Josepha Pigg, RN Outcome: Progressing 05/23/2021 0323 by Josepha Pigg, RN Outcome: Progressing   Problem: Respiratory: Goal: Ability to maintain adequate ventilation will improve 05/23/2021 0454 by Josepha Pigg, RN Outcome: Progressing 05/23/2021 0323 by Josepha Pigg, RN Outcome: Progressing   Problem: Education: Goal: Knowledge of General Education information will improve Description: Including pain rating scale, medication(s)/side effects and non-pharmacologic comfort measures Outcome: Progressing   Problem: Health Behavior/Discharge Planning: Goal: Ability to manage health-related needs will  improve Outcome: Progressing   Problem: Clinical Measurements: Goal: Ability to maintain clinical measurements within normal limits will improve Outcome: Progressing Goal: Will remain free from infection Outcome: Progressing Goal: Diagnostic test results will improve Outcome: Progressing Goal: Respiratory complications will improve Outcome: Progressing Goal: Cardiovascular complication will be avoided Outcome: Progressing   Problem: Activity: Goal: Risk for activity intolerance will decrease Outcome: Progressing   Problem: Nutrition: Goal: Adequate nutrition will be maintained Outcome: Progressing   Problem: Coping: Goal: Level of anxiety will decrease Outcome: Progressing   Problem: Elimination: Goal: Will not experience complications related to bowel motility Outcome: Progressing Goal: Will not experience complications related to urinary retention Outcome: Progressing   Problem: Pain Managment: Goal: General experience of comfort will improve Outcome: Progressing   Problem: Safety: Goal: Ability to remain free from injury will improve Outcome: Progressing   Problem: Skin Integrity: Goal: Risk for impaired skin integrity will decrease Outcome: Progressing

## 2021-05-23 NOTE — Assessment & Plan Note (Signed)
Continue Crestor 

## 2021-05-23 NOTE — Assessment & Plan Note (Signed)
-   Continue IV antibiotics - Plan for TEE Friday

## 2021-05-23 NOTE — Plan of Care (Signed)

## 2021-05-23 NOTE — Assessment & Plan Note (Signed)
Blood pressure controlled.  Continue carvedilol 

## 2021-05-23 NOTE — Progress Notes (Signed)
  Progress Note    Todd Novak   MWN:[id number]  DOB: 05/05/1966  DOA: 05/18/2021     4 Date of Service: 05/23/2021    Brief summary: Todd Novak is a 55 y.o. M with HTN, CLL, AS who presented with positive blood cultures.    Came to ER few days prior to admission with fever, body aches.  Appeared benign, discharged but cultures returned positive, was called back.  Cultures with Strep constellatus in 2/2, viridans strep, presumed oral source.    ID consulted with recommendations for further work up with TEE, antibiotic therapy changed to penicillin.      10/22: admitted and started on Rocephin 10/24: ID consulted, Abx narrowed, TEE recommended 10/27: still waiting on TEE         Assessment and Plan * Sepsis due to Streptococcus species ([place]) - Continue IV antibiotics - Plan for TEE Friday  Essential hypertension Blood pressure controlled - Continue carvedilol  Thrombocytopenia (HCC)    History of Barrett's esophagus - Continue pantoprazole  Hyperlipidemia - Continue Crestor     Subjective:  No new findings.  Objective Vitals:   05/23/21 0148 05/23/21 0512 05/23/21 0900 05/23/21 1504  BP: 115/79 123/79 (!) 144/113 118/78  Pulse: 77 74 99 90  Resp: 17 16 16 16   Temp: 97.6 F (36.4 C) 98.6 F (37 C) 98.7 F (37.1 C) 98.1 F (36.7 C)  TempSrc:      SpO2: 98% 98% 99% 100%  Weight:      Height:       89.5 kg  Vital signs were reviewed and unremarkable.   Exam Physical Exam Constitutional:      Appearance: Normal appearance. He is not ill-appearing or toxic-appearing.  Neurological:     General: No focal deficit present.     Mental Status: He is alert and oriented to person, place, and time.     Motor: No weakness.     Gait: Gait normal.  Psychiatric:        Mood and Affect: Mood normal.        Behavior: Behavior normal.        Thought Content: Thought content normal.        Judgment: Judgment normal.       Labs / Other  Information My review of labs, imaging, notes and other tests shows no new significant findings.    Disposition Plan: Status is: Inpatient  Remains inpatient appropriate because: Requires TEE to determine conclusive therapy.  TEE planned for tomorrow, likely home afterwards with either PICC line or oral antibiotics        Time spent: 15 minutes [facility] 05/23/2021, 5:46 PM

## 2021-05-24 ENCOUNTER — Inpatient Hospital Stay (HOSPITAL_COMMUNITY): Payer: No Typology Code available for payment source | Admitting: Anesthesiology

## 2021-05-24 ENCOUNTER — Encounter (HOSPITAL_COMMUNITY): Payer: Self-pay | Admitting: Internal Medicine

## 2021-05-24 ENCOUNTER — Inpatient Hospital Stay (HOSPITAL_COMMUNITY): Payer: No Typology Code available for payment source

## 2021-05-24 ENCOUNTER — Encounter (HOSPITAL_COMMUNITY)
Admission: EM | Disposition: A | Discharge status: Home / Self Care | Payer: Self-pay | Source: Home / Self Care | Attending: Internal Medicine

## 2021-05-24 DIAGNOSIS — I340 Nonrheumatic mitral (valve) insufficiency: Secondary | ICD-10-CM | POA: Diagnosis not present

## 2021-05-24 DIAGNOSIS — R7881 Bacteremia: Secondary | ICD-10-CM | POA: Diagnosis not present

## 2021-05-24 DIAGNOSIS — A409 Streptococcal sepsis, unspecified: Secondary | ICD-10-CM | POA: Diagnosis not present

## 2021-05-24 HISTORY — PX: TEE WITHOUT CARDIOVERSION: SHX5443

## 2021-05-24 HISTORY — PX: BUBBLE STUDY: SHX6837

## 2021-05-24 LAB — CBC WITH DIFFERENTIAL/PLATELET
Abs Immature Granulocytes: 0.03 10*3/uL (ref 0.00–0.07)
Basophils Absolute: 0.1 10*3/uL (ref 0.0–0.1)
Basophils Relative: 0 %
Eosinophils Absolute: 0.1 10*3/uL (ref 0.0–0.5)
Eosinophils Relative: 0 %
HCT: 37.7 % — ABNORMAL LOW (ref 39.0–52.0)
Hemoglobin: 12.5 g/dL — ABNORMAL LOW (ref 13.0–17.0)
Immature Granulocytes: 0 %
Lymphocytes Relative: 88 %
Lymphs Abs: 28.8 10*3/uL — ABNORMAL HIGH (ref 0.7–4.0)
MCH: 31.6 pg (ref 26.0–34.0)
MCHC: 33.2 g/dL (ref 30.0–36.0)
MCV: 95.4 fL (ref 80.0–100.0)
Monocytes Absolute: 2.3 10*3/uL — ABNORMAL HIGH (ref 0.1–1.0)
Monocytes Relative: 7 %
Neutro Abs: 1.5 10*3/uL — ABNORMAL LOW (ref 1.7–7.7)
Neutrophils Relative %: 5 %
Platelets: 112 10*3/uL — ABNORMAL LOW (ref 150–400)
RBC: 3.95 MIL/uL — ABNORMAL LOW (ref 4.22–5.81)
RDW: 12.9 % (ref 11.5–15.5)
WBC Morphology: ABNORMAL
WBC: 32.7 10*3/uL — ABNORMAL HIGH (ref 4.0–10.5)
nRBC: 0.1 % (ref 0.0–0.2)

## 2021-05-24 LAB — BASIC METABOLIC PANEL
Anion gap: 7 (ref 5–15)
BUN: 13 mg/dL (ref 6–20)
CO2: 27 mmol/L (ref 22–32)
Calcium: 9 mg/dL (ref 8.9–10.3)
Chloride: 105 mmol/L (ref 98–111)
Creatinine, Ser: 1.09 mg/dL (ref 0.61–1.24)
GFR, Estimated: >60 mL/min (ref >60)
Glucose, Bld: 136 mg/dL — ABNORMAL HIGH (ref 70–99)
Potassium: 4.4 mmol/L (ref 3.5–5.1)
Sodium: 139 mmol/L (ref 135–145)

## 2021-05-24 SURGERY — ECHOCARDIOGRAM, TRANSESOPHAGEAL
Anesthesia: Monitor Anesthesia Care

## 2021-05-24 MED ORDER — SODIUM CHLORIDE 0.9 % IV SOLN: INTRAVENOUS | Status: DC | PRN

## 2021-05-24 MED ORDER — AMOXICILLIN 500 MG PO CAPS: 500.0000 mg | ORAL_CAPSULE | Freq: Three times a day (TID) | ORAL | 0 refills | Status: DC

## 2021-05-24 MED ORDER — PROPOFOL 10 MG/ML IV BOLUS
INTRAVENOUS | Status: DC | PRN
  Administered 2021-05-24: 30 mg via INTRAVENOUS
  Administered 2021-05-24: 40 mg via INTRAVENOUS

## 2021-05-24 MED ORDER — PROPOFOL 500 MG/50ML IV EMUL
INTRAVENOUS | Status: DC | PRN
  Administered 2021-05-24: 150 ug/kg/min via INTRAVENOUS

## 2021-05-24 MED ORDER — IBUPROFEN 400 MG PO TABS
600.0000 mg | ORAL_TABLET | Freq: Four times a day (QID) | ORAL | Status: DC | PRN
  Administered 2021-05-24: 600 mg via ORAL
  Filled 2021-05-24: qty 1

## 2021-05-24 NOTE — Assessment & Plan Note (Addendum)
Patient presented with bacteremia from streptococcus.  Treated with IV antibiotics, narrowed to Penicillin.  ID consulted, recommended TEE.  TEE negative.  No recurrent fever, no embolic phenomena, repeat cultures negative.  ID recommended 2 weeks antibiotics total, to be completed with 7 more days amoxicillin.  No ID follow up needed.

## 2021-05-24 NOTE — Progress Notes (Signed)
Called report to [employer] RN @[facility]. Spoke w/ pt's wife as well to inform her pt is out of procedure and in recovery.

## 2021-05-24 NOTE — Anesthesia Preprocedure Evaluation (Signed)
Anesthesia Evaluation  Patient identified by MRN, date of birth, ID band Patient awake    Reviewed: Allergy & Precautions, H&P , NPO status , Patient's Chart, lab work & pertinent test results  Airway Mallampati: II   Neck ROM: full    Dental   Pulmonary asthma , Current Smoker,    breath sounds clear to auscultation       Cardiovascular hypertension,  Rhythm:regular Rate:Normal     Neuro/Psych PSYCHIATRIC DISORDERS Anxiety  Neuromuscular disease    GI/Hepatic GERD  ,  Endo/Other    Renal/GU      Musculoskeletal  (+) Arthritis ,   Abdominal   Peds  Hematology   Anesthesia Other Findings   Reproductive/Obstetrics                             Anesthesia Physical Anesthesia Plan  ASA: 3  Anesthesia Plan: MAC   Post-op Pain Management:    Induction: Intravenous  PONV Risk Score and Plan: 0 and Propofol infusion and Treatment may vary due to age or medical condition  Airway Management Planned: Nasal Cannula  Additional Equipment:   Intra-op Plan:   Post-operative Plan:   Informed Consent: I have reviewed the patients History and Physical, chart, labs and discussed the procedure including the risks, benefits and alternatives for the proposed anesthesia with the patient or authorized representative who has indicated his/her understanding and acceptance.     Dental advisory given  Plan Discussed with: CRNA, Anesthesiologist and Surgeon  Anesthesia Plan Comments:         Anesthesia Quick Evaluation

## 2021-05-24 NOTE — Progress Notes (Signed)
Patient stated he would like to be disconnected from his IV d/t constant beeping despite RN trouble shooting and fixing. He also states he cant have anything to eat or drink and that his throat hurts and that on top of that the beeping is driving him crazy. I assured him that I removed the bubbles from the line and that it should resolve. When he asked to have it disconnected, RN stated that he has IV abx running at the time. He states "I've been on them for 7 days". I told him he has the right to want to medication off and that I will let the MD know.

## 2021-05-24 NOTE — Plan of Care (Signed)
Instructions were reviewed with patient. All questions were answered. Patient was transported to main entrance by wheelchair. ° °

## 2021-05-24 NOTE — Interval H&P Note (Signed)
History and Physical Interval Note:  05/24/2021 12:37 PM  Todd Novak  has presented today for surgery, with the diagnosis of BACTEREMIA.  The various methods of treatment have been discussed with the patient and family. After consideration of risks, benefits and other options for treatment, the patient has consented to  Procedure(s): TRANSESOPHAGEAL ECHOCARDIOGRAM (TEE) (N/A) as a surgical intervention.  The patient's history has been reviewed, patient examined, no change in status, stable for surgery.  I have reviewed the patient's chart and labs.  Questions were answered to the patient's satisfaction.     Elouise Munroe

## 2021-05-24 NOTE — Progress Notes (Signed)
[facility]   Reason for visit: Follow up on bacteremia  Interval History: TEE negative for vegetation; repeat blood cultures remain ngtd.   Penicillin day 7  Physical Exam: Constitutional:  Vitals:   05/24/21 1341 05/24/21 1351  BP: 110/71 (!) 131/94  Pulse: 79 84  Resp: 17 13  Temp:    SpO2: 93% 97%   patient appears in NAD   Review of Systems: Constitutional: negative for fevers and chills  Lab Results  Component Value Date   WBC 32.7 (H) 05/24/2021   HGB 12.5 (L) 05/24/2021   HCT 37.7 (L) 05/24/2021   MCV 95.4 05/24/2021   PLT 112 (L) 05/24/2021    Lab Results  Component Value Date   CREATININE 1.09 05/24/2021   BUN 13 05/24/2021   NA 139 05/24/2021   K 4.4 05/24/2021   CL 105 05/24/2021   CO2 27 05/24/2021    Lab Results  Component Value Date   ALT 25 05/18/2021   AST 26 05/18/2021   ALKPHOS 94 05/18/2021     Microbiology: Recent Results (from the past 240 hour(s))  Culture, blood (routine x 2)     Status: Abnormal   Collection Time: 05/16/21  9:04 PM   Specimen: BLOOD  Result Value Ref Range Status   Specimen Description   Final    BLOOD RIGHT ANTECUBITAL Performed at [facility], [address] [place], [place] [address]    Special Requests   Final    BOTTLES DRAWN AEROBIC AND ANAEROBIC Blood Culture adequate volume Performed at [facility], [address] [place], [place] [address]    Culture  Setup Time   Final    GRAM POSITIVE COCCI ANAEROBIC BOTTLE ONLY CRITICAL VALUE NOTED.  VALUE IS CONSISTENT WITH PREVIOUSLY REPORTED AND CALLED VALUE. IN BOTH AEROBIC AND ANAEROBIC BOTTLES    Culture (A)  Final    STREPTOCOCCUS CONSTELLATUS SUSCEPTIBILITIES PERFORMED ON PREVIOUS CULTURE WITHIN THE LAST 5 DAYS. Performed at [facility], [place] [address] [place], [place] [address]    Report Status 05/20/2021 FINAL  Final  Resp Panel by RT-PCR (Flu A&B, Covid) Nasopharyngeal Swab      Status: None   Collection Time: 05/16/21  9:14 PM   Specimen: Nasopharyngeal Swab; Nasopharyngeal(NP) swabs in vial transport medium  Result Value Ref Range Status   SARS Coronavirus 2 by RT PCR NEGATIVE NEGATIVE Final    Comment: (NOTE) SARS-CoV-2 target nucleic acids are NOT DETECTED.  The SARS-CoV-2 RNA is generally detectable in upper respiratory specimens during the acute phase of infection. The lowest concentration of SARS-CoV-2 viral copies this assay can detect is 138 copies/mL. A negative result does not preclude SARS-Cov-2 infection and should not be used as the sole basis for treatment or other patient management decisions. A negative result may occur with  improper specimen collection/handling, submission of specimen other than nasopharyngeal swab, presence of viral mutation(s) within the areas targeted by this assay, and inadequate number of viral copies(<138 copies/mL). A negative result must be combined with clinical observations, patient history, and epidemiological information. The expected result is Negative.  Fact Sheet for Patients:  EntrepreneurPulse.com.au  Fact Sheet for Healthcare Providers:  IncredibleEmployment.be  This test is no t yet approved or cleared by the [place] FDA and  has been authorized for detection and/or diagnosis of SARS-CoV-2 by FDA under an Emergency Use Authorization (EUA). This EUA will remain  in effect (meaning this test  can be used) for the duration of the COVID-19 declaration under Section 564(b)(1) of the Act, 21 U.S.C.section 360bbb-3(b)(1), unless the authorization is terminated  or revoked sooner.       Influenza A by PCR NEGATIVE NEGATIVE Final   Influenza B by PCR NEGATIVE NEGATIVE Final    Comment: (NOTE) The Xpert Xpress SARS-CoV-2/FLU/RSV plus assay is intended as an aid in the diagnosis of influenza from Nasopharyngeal swab specimens and should not be used as a sole basis  for treatment. Nasal washings and aspirates are unacceptable for Xpert Xpress SARS-CoV-2/FLU/RSV testing.  Fact Sheet for Patients: EntrepreneurPulse.com.au  Fact Sheet for Healthcare Providers: IncredibleEmployment.be  This test is not yet approved or cleared by the [place] FDA and has been authorized for detection and/or diagnosis of SARS-CoV-2 by FDA under an Emergency Use Authorization (EUA). This EUA will remain in effect (meaning this test can be used) for the duration of the COVID-19 declaration under Section 564(b)(1) of the Act, 21 U.S.C. section 360bbb-3(b)(1), unless the authorization is terminated or revoked.  Performed at [facility], [address] [place], [place] [address]   Culture, blood (routine x 2)     Status: Abnormal   Collection Time: 05/16/21  9:15 PM   Specimen: BLOOD  Result Value Ref Range Status   Specimen Description   Final    BLOOD SITE NOT SPECIFIED Performed at [facility], [address] [place], [place] [address]    Special Requests   Final    BOTTLES DRAWN AEROBIC AND ANAEROBIC Blood Culture results may not be optimal due to an inadequate volume of blood received in culture bottles Performed at [facility], [address] [place], [place] [address]    Culture  Setup Time   Final    GRAM POSITIVE COCCI ANAEROBIC BOTTLE ONLY CRITICAL RESULT CALLED TO, READ BACK BY AND VERIFIED WITH: RN KELLY NEAL 05/18/21@00 :40 BY TW IN BOTH AEROBIC AND ANAEROBIC BOTTLES    Culture (A)  Final    STREPTOCOCCUS CONSTELLATUS Beta hemolytic streptococci are predictably susceptible to penicillin and other beta lactams. Susceptibility testing not routinely performed. Performed at [facility], [place] [address], [place], [place] [address]    Report Status 05/20/2021 FINAL  Final   Organism ID, Bacteria STREPTOCOCCUS CONSTELLATUS  Final      Susceptibility   Streptococcus  constellatus - MIC*    PENICILLIN <=0.06 SENSITIVE Sensitive     CEFTRIAXONE 0.25 SENSITIVE Sensitive     ERYTHROMYCIN <=0.12 SENSITIVE Sensitive     LEVOFLOXACIN <=0.25 SENSITIVE Sensitive     VANCOMYCIN 0.5 SENSITIVE Sensitive     * STREPTOCOCCUS CONSTELLATUS  Blood Culture ID Panel (Reflexed)     Status: Abnormal   Collection Time: 05/16/21  9:15 PM  Result Value Ref Range Status   Enterococcus faecalis NOT DETECTED NOT DETECTED Final   Enterococcus Faecium NOT DETECTED NOT DETECTED Final   Listeria monocytogenes NOT DETECTED NOT DETECTED Final   Staphylococcus species NOT DETECTED NOT DETECTED Final   Staphylococcus aureus (BCID) NOT DETECTED NOT DETECTED Final   Staphylococcus epidermidis NOT DETECTED NOT DETECTED Final   Staphylococcus lugdunensis NOT DETECTED NOT DETECTED Final   Streptococcus species DETECTED (A) NOT DETECTED Final    Comment: Not Enterococcus species, Streptococcus agalactiae, Streptococcus pyogenes, or Streptococcus pneumoniae. CRITICAL RESULT CALLED TO, READ BACK BY AND VERIFIED WITH: RN KELLY NEAL 05/18/21@00 :40 BY TW    Streptococcus agalactiae NOT DETECTED NOT DETECTED Final  Streptococcus pneumoniae NOT DETECTED NOT DETECTED Final   Streptococcus pyogenes NOT DETECTED NOT DETECTED Final   A.calcoaceticus-baumannii NOT DETECTED NOT DETECTED Final   Bacteroides fragilis NOT DETECTED NOT DETECTED Final   Enterobacterales NOT DETECTED NOT DETECTED Final   Enterobacter cloacae complex NOT DETECTED NOT DETECTED Final   Escherichia coli NOT DETECTED NOT DETECTED Final   Klebsiella aerogenes NOT DETECTED NOT DETECTED Final   Klebsiella oxytoca NOT DETECTED NOT DETECTED Final   Klebsiella pneumoniae NOT DETECTED NOT DETECTED Final   Proteus species NOT DETECTED NOT DETECTED Final   Salmonella species NOT DETECTED NOT DETECTED Final   Serratia marcescens NOT DETECTED NOT DETECTED Final   Haemophilus influenzae NOT DETECTED NOT DETECTED Final   Neisseria  meningitidis NOT DETECTED NOT DETECTED Final   Pseudomonas aeruginosa NOT DETECTED NOT DETECTED Final   Stenotrophomonas maltophilia NOT DETECTED NOT DETECTED Final   Candida albicans NOT DETECTED NOT DETECTED Final   Candida auris NOT DETECTED NOT DETECTED Final   Candida glabrata NOT DETECTED NOT DETECTED Final   Candida krusei NOT DETECTED NOT DETECTED Final   Candida parapsilosis NOT DETECTED NOT DETECTED Final   Candida tropicalis NOT DETECTED NOT DETECTED Final   Cryptococcus neoformans/gattii NOT DETECTED NOT DETECTED Final    Comment: Performed at [facility], [address] [place], [place] [address]  Urine Culture     Status: None   Collection Time: 05/16/21 11:15 PM   Specimen: Urine, Clean Catch  Result Value Ref Range Status   Specimen Description   Final    URINE, CLEAN CATCH Performed at [facility], [address] [place], [place] [address]    Special Requests   Final    NONE Performed at [facility], [address] [place], [place] [address]    Culture   Final    NO GROWTH Performed at [facility], [place] [address], [place], [place] [address]    Report Status 05/18/2021 FINAL  Final  Blood culture (routine x 2)     Status: Abnormal   Collection Time: 05/18/21  9:38 AM   Specimen: BLOOD  Result Value Ref Range Status   Specimen Description   Final    BLOOD LEFT ANTECUBITAL Performed at [facility], [address] [place], [place] [address]    Special Requests   Final    BOTTLES DRAWN AEROBIC AND ANAEROBIC Blood Culture adequate volume Performed at [facility], [address] [place], [place] [address]    Culture  Setup Time   Final    GRAM POSITIVE COCCI IN PAIRS AND CHAINS IN BOTH AEROBIC AND ANAEROBIC BOTTLES CRITICAL VALUE NOTED.  VALUE IS CONSISTENT WITH PREVIOUSLY REPORTED AND CALLED VALUE.    Culture (A)  Final    STREPTOCOCCUS CONSTELLATUS SUSCEPTIBILITIES PERFORMED ON PREVIOUS  CULTURE WITHIN THE LAST 5 DAYS. Performed at [facility], [place] [address] [place], [place] [address]    Report Status 05/21/2021 FINAL  Final  Blood culture (routine x 2)     Status: Abnormal   Collection Time: 05/18/21  9:48 AM   Specimen: BLOOD LEFT HAND  Result Value Ref Range Status   Specimen Description   Final    BLOOD LEFT HAND Performed at [facility], [address] [place], [place] [address]    Special Requests   Final    BOTTLES DRAWN AEROBIC AND ANAEROBIC Blood Culture adequate volume  Performed at [facility], [address] [place], [place] [address]    Culture  Setup Time   Final    GRAM POSITIVE COCCI IN PAIRS AND CHAINS IN BOTH AEROBIC AND ANAEROBIC BOTTLES CRITICAL VALUE NOTED.  VALUE IS CONSISTENT WITH PREVIOUSLY REPORTED AND CALLED VALUE.    Culture (A)  Final    STREPTOCOCCUS CONSTELLATUS SUSCEPTIBILITIES PERFORMED ON PREVIOUS CULTURE WITHIN THE LAST 5 DAYS. Performed at [facility], [place] [address], [place], [place] [address]    Report Status 05/21/2021 FINAL  Final  Resp Panel by RT-PCR (Flu A&B, Covid) Nasopharyngeal Swab     Status: None   Collection Time: 05/18/21 10:24 AM   Specimen: Nasopharyngeal Swab; Nasopharyngeal(NP) swabs in vial transport medium  Result Value Ref Range Status   SARS Coronavirus 2 by RT PCR NEGATIVE NEGATIVE Final    Comment: (NOTE) SARS-CoV-2 target nucleic acids are NOT DETECTED.  The SARS-CoV-2 RNA is generally detectable in upper respiratory specimens during the acute phase of infection. The lowest concentration of SARS-CoV-2 viral copies this assay can detect is 138 copies/mL. A negative result does not preclude SARS-Cov-2 infection and should not be used as the sole basis for treatment or other patient management decisions. A negative result may occur with  improper specimen collection/handling, submission of specimen other than nasopharyngeal swab, presence of viral mutation(s)  within the areas targeted by this assay, and inadequate number of viral copies(<138 copies/mL). A negative result must be combined with clinical observations, patient history, and epidemiological information. The expected result is Negative.  Fact Sheet for Patients:  EntrepreneurPulse.com.au  Fact Sheet for Healthcare Providers:  IncredibleEmployment.be  This test is no t yet approved or cleared by the [place] FDA and  has been authorized for detection and/or diagnosis of SARS-CoV-2 by FDA under an Emergency Use Authorization (EUA). This EUA will remain  in effect (meaning this test can be used) for the duration of the COVID-19 declaration under Section 564(b)(1) of the Act, 21 U.S.C.section 360bbb-3(b)(1), unless the authorization is terminated  or revoked sooner.       Influenza A by PCR NEGATIVE NEGATIVE Final   Influenza B by PCR NEGATIVE NEGATIVE Final    Comment: (NOTE) The Xpert Xpress SARS-CoV-2/FLU/RSV plus assay is intended as an aid in the diagnosis of influenza from Nasopharyngeal swab specimens and should not be used as a sole basis for treatment. Nasal washings and aspirates are unacceptable for Xpert Xpress SARS-CoV-2/FLU/RSV testing.  Fact Sheet for Patients: EntrepreneurPulse.com.au  Fact Sheet for Healthcare Providers: IncredibleEmployment.be  This test is not yet approved or cleared by the [place] FDA and has been authorized for detection and/or diagnosis of SARS-CoV-2 by FDA under an Emergency Use Authorization (EUA). This EUA will remain in effect (meaning this test can be used) for the duration of the COVID-19 declaration under Section 564(b)(1) of the Act, 21 U.S.C. section 360bbb-3(b)(1), unless the authorization is terminated or revoked.  Performed at [facility], [address] [place], [place] [address]   Urine Culture     Status: None    Collection Time: 05/18/21 11:56 AM   Specimen: Urine, Clean Catch  Result Value Ref Range Status   Specimen Description   Final    URINE, CLEAN CATCH Performed at [facility], [address] [place], [place] [address]    Special Requests   Final    NONE Performed at [facility], [address] [place], [place] [address]    Culture   Final    NO GROWTH Performed at [facility], [place] [address] [place], [place] [address]    Report Status 05/19/2021 FINAL  Final  Culture, blood (routine x 2)     Status: None (Preliminary result)   Collection Time: 05/20/21  1:47 PM   Specimen: BLOOD  Result Value Ref Range Status   Specimen Description   Final    BLOOD RIGHT ANTECUBITAL Performed at [place] [address], [place], [place] [address]    Special Requests   Final    BOTTLES DRAWN AEROBIC ONLY Blood Culture adequate volume Performed at [place] [address], [place], [place] [address]    Culture   Final    NO GROWTH 4 DAYS Performed at [facility], [place] [address], [place], [place] [address]    Report Status PENDING  Incomplete  Culture, blood (routine x 2)     Status: None (Preliminary result)   Collection Time: 05/20/21  1:52 PM   Specimen: BLOOD  Result Value Ref Range Status   Specimen Description   Final    BLOOD LEFT ANTECUBITAL Performed at [facility] [address] [place], [place] [address]    Special Requests   Final    BOTTLES DRAWN AEROBIC ONLY Blood Culture adequate volume Performed at [facility] [address], [place], [place] [address]    Culture   Final    NO GROWTH 4 DAYS Performed at [facility], [place] [address], [place], [place] [address]    Report Status PENDING  Incomplete    Impression/Plan:  1. Bacteremia with Strep constellatus - TEE done and no concerns for vegetation, therefore 7 more days of oral  amoxicillin 500 mg three times a day will be adequate.    OK for discharge from ID standpoint.

## 2021-05-24 NOTE — Anesthesia Procedure Notes (Signed)
Procedure Name: MAC Date/Time: 05/24/2021 1:05 PM Performed by: Moshe Salisbury, CRNA Pre-anesthesia Checklist: Patient identified, Emergency Drugs available, Suction available and Patient being monitored Patient Re-evaluated:Patient Re-evaluated prior to induction Oxygen Delivery Method: Nasal cannula Ventilation: Nasal airway inserted- appropriate to patient size Placement Confirmation: positive ETCO2 Dental Injury: Teeth and Oropharynx as per pre-operative assessment

## 2021-05-24 NOTE — Progress Notes (Signed)
Echocardiogram Echocardiogram Transesophageal has been performed.  Oneal Deputy Herminio Kniskern RDCS 05/24/2021, 1:50 PM

## 2021-05-24 NOTE — CV Procedure (Signed)
INDICATIONS: bacteremia  PROCEDURE:   Informed consent was obtained prior to the procedure. The risks, benefits and alternatives for the procedure were discussed and the patient comprehended these risks.  Risks include, but are not limited to, cough, sore throat, vomiting, nausea, somnolence, esophageal and stomach trauma or perforation, bleeding, low blood pressure, aspiration, pneumonia, infection, trauma to the teeth and death.    After a procedural time-out, the oropharynx was anesthetized with 20% benzocaine spray.   During this procedure the patient was administered propofol per anesthesia.  The patient's heart rate, blood pressure, and oxygen saturation were monitored continuously during the procedure. The period of conscious sedation was 30 minutes, of which I was present face-to-face 100% of this time.  The transesophageal probe was inserted in the esophagus and stomach without difficulty and multiple views were obtained.  The patient was kept under observation until the patient left the procedure room.  The patient left the procedure room in stable condition.   Agitated microbubble saline contrast was administered.  COMPLICATIONS:    There were no immediate complications.  FINDINGS:   FORMAL ECHOCARDIOGRAM REPORT PENDING Normal biventricular function No valvular vegetations. Severe basal septal hypertrophy with LVOT obstruction and mild-moderate MR which is at least partially SAM mediated.   RECOMMENDATIONS:    Consider outpatient cardiac MRI given severe basal septal hypertrophy to evaluate for hypertrophic cardiomyopathy.  Time Spent Directly with the Patient:  45 minutes   Todd Novak A [name] 05/24/2021, 1:42 PM

## 2021-05-24 NOTE — Transfer of Care (Signed)
Immediate Anesthesia Transfer of Care Note  Patient: Todd Novak  Procedure(s) Performed: TRANSESOPHAGEAL ECHOCARDIOGRAM (TEE) BUBBLE STUDY  Patient Location: Endoscopy Unit  Anesthesia Type:MAC  Level of Consciousness: drowsy and patient cooperative  Airway & Oxygen Therapy: Patient Spontanous Breathing and Patient connected to nasal cannula oxygen  Post-op Assessment: Report given to RN and Post -op Vital signs reviewed and stable  Post vital signs: Reviewed and stable  Last Vitals:  Vitals Value Taken Time  BP 110/71 05/24/21 1341  Temp    Pulse 75 05/24/21 1344  Resp 18 05/24/21 1344  SpO2 95 % 05/24/21 1344  Vitals shown include unvalidated device data.  Last Pain:  Vitals:   05/24/21 1341  TempSrc:   PainSc: Asleep      Patients Stated Pain Goal: 3 (25/49/82 6415)  Complications: No notable events documented.

## 2021-05-25 LAB — CULTURE, BLOOD (ROUTINE X 2)
Culture: NO GROWTH
Culture: NO GROWTH
Special Requests: ADEQUATE
Special Requests: ADEQUATE

## 2021-05-25 NOTE — Discharge Summary (Signed)
Physician Discharge Summary   Patient name: Todd Novak  Admit date:     05/18/2021  Discharge date: 05/24/2021  Discharge Physician: Edwin Dada   PCP: Shelda Pal, DO   Recommendations at discharge:  Follow up with PCP in 1 week Dr. Nani Ravens: Please repeat CBC to check platelets Follow up with Cardiology, Dr. Harriet Masson, to discuss cardiac MRI given septal hypertrophy seen on TEE     Discharge Diagnoses Principal Problem:   Sepsis due to Streptococcus species [facility]) Active Problems:   Essential hypertension   CLL (chronic lymphocytic leukemia) ([place])   Thrombocytopenia ([place])   History of Barrett's esophagus   Hyperlipidemia     Hospital Course   [name] is a 55 y.o. M with HTN, CLL, AS who presented with positive blood cultures.    Came to ER few days prior to admission with fever, body aches.  Appeared benign, discharged but cultures returned positive, was called back.  Cultures with Strep constellatus in 2/2, viridans strep, presumed oral source.         10/22: admitted and started on Rocephin 10/24: ID consulted, Abx narrowed, TEE recommended 10/28: TEE negative, discharged       * Sepsis due to Streptococcus species [name]) Patient presented with bacteremia from streptococcus.  Treated with IV antibiotics, narrowed to Penicillin.  ID consulted, recommended TEE.  TEE negative.  No recurrent fever, no embolic phenomena, repeat cultures negative.  ID recommended 2 weeks antibiotics total, to be completed with 7 more days amoxicillin.  No ID follow up needed.  Essential hypertension    CLL (chronic lymphocytic leukemia) ([facility])     Thrombocytopenia ([facility])     History of Barrett's esophagus    Hyperlipidemia        Procedures performed: TEE   Condition at discharge: good  Exam Physical Exam Constitutional:      Appearance: Normal appearance. He is not ill-appearing or toxic-appearing.  Cardiovascular:     Rate and  Rhythm: Normal rate and regular rhythm.     Heart sounds: No murmur heard. Pulmonary:     Breath sounds: No wheezing or rales.  Musculoskeletal:        General: No swelling. Normal range of motion.  Neurological:     General: No focal deficit present.     Mental Status: He is alert and oriented to person, place, and time.     Motor: No weakness.  Psychiatric:        Mood and Affect: Mood normal.        Behavior: Behavior normal.        Thought Content: Thought content normal.      Disposition: Home  Discharge time: greater than 30 minutes.  Follow-up Information     Shelda Pal, DO. Schedule an appointment as soon as possible for a visit in 1 week(s).   Specialty: Family Medicine Contact information: [address] [place] [address] [phone]         Berniece Salines, DO. Schedule an appointment as soon as possible for a visit in 2 week(s).   Specialty: Cardiology Why: For cardiac MRI Contact information: [place] [address] [phone]                 Allergies as of 05/24/2021       Reactions   Codeine Nausea And Vomiting   Dizziness   Doxycycline Nausea Only   Methocarbamol Nausea Only  dizziness   Tramadol Nausea And Vomiting        Medication List     STOP taking these medications    metoprolol tartrate 100 MG tablet Commonly known as: LOPRESSOR   valACYclovir 1000 MG tablet Commonly known as: VALTREX       TAKE these medications    acetaminophen 325 MG tablet Commonly known as: TYLENOL Take 650 mg by mouth every 4 (four) hours as needed for mild pain, moderate pain or headache.   amoxicillin 500 MG capsule Commonly known as: AMOXIL Take 1 capsule (500 mg total) by mouth 3 (three) times daily.   carvedilol 3.125 MG tablet Commonly known as: COREG Take 1 tablet (3.125 mg total) by mouth 2 (two) times daily.   famotidine 20 MG tablet Commonly known as:  Pepcid Take 1 tablet (20 mg total) by mouth at bedtime.   fexofenadine 180 MG tablet Commonly known as: ALLEGRA Take 1 tablet (180 mg total) by mouth daily. What changed: when to take this   LYSINE PO Take 1 tablet by mouth daily as needed (cold sores).   omeprazole 40 MG capsule Commonly known as: PRILOSEC Take 1 capsule (40 mg total) by mouth in the morning and at bedtime. What changed: when to take this   rosuvastatin 10 MG tablet Commonly known as: CRESTOR Take 1 tablet (10 mg total) by mouth daily.   vitamin C 100 MG tablet Take 100 mg by mouth daily.   ZINC PO Take 1 tablet by mouth every other day.        DG Chest 2 View  Result Date: 05/16/2021 CLINICAL DATA:  Pain in joints history of sepsis EXAM: CHEST - 2 VIEW COMPARISON:  04/01/2019 FINDINGS: The heart size and mediastinal contours are within normal limits. Both lungs are clear. The visualized skeletal structures are unremarkable. IMPRESSION: No active cardiopulmonary disease. Electronically Signed   By: Donavan Foil M.D.   On: 05/16/2021 21:20   DG Chest Portable 1 View  Result Date: 05/18/2021 CLINICAL DATA:  55 year old male with bacteremia. EXAM: PORTABLE CHEST - 1 VIEW COMPARISON:  05/16/2021 FINDINGS: The mediastinal contours are within normal limits. No cardiomegaly. The lungs are clear bilaterally without evidence of focal consolidation, pleural effusion, or pneumothorax. No acute osseous abnormality. IMPRESSION: No acute cardiopulmonary process. Electronically Signed   By: Ruthann Cancer M.D.   On: 05/18/2021 10:19   ECHOCARDIOGRAM COMPLETE  Result Date: 05/20/2021    ECHOCARDIOGRAM REPORT   Patient Name:   Todd Novak Date of Exam: 05/20/2021 Medical Rec #:  [id number]        Height:       69.0 in Accession #:    [id number]       Weight:       197.3 lb Date of Birth:  [birth year]        BSA:          2.054 m Patient Age:    55 years         BP:           141/93 mmHg Patient Gender: M                 HR:           80 bpm. Exam Location:  Inpatient Procedure: 2D Echo, Cardiac Doppler and Color Doppler Indications:    Bacteremia R78.81  History:        Patient has prior history of Echocardiogram examinations, most  recent 04/15/2021. Signs/Symptoms:Murmur; Risk                 Factors:Hypertension and Dyslipidemia. GERD. Cacteremia,                 Streptococcus species. Hypertrophic cardiomyopathy. Barrett's                 esophagus.  Sonographer:    Darlina Sicilian RDCS Referring Phys: [id number] [place]  1. Left ventricular ejection fraction, by estimation, is 60 to 65%. The left ventricle has normal function. The left ventricle has no regional wall motion abnormalities. Left ventricular diastolic parameters were normal.  2. Right ventricular systolic function is normal. The right ventricular size is normal.  3. The mitral valve is normal in structure. Trivial mitral valve regurgitation. No evidence of mitral stenosis.  4. The aortic valve is normal in structure. Aortic valve regurgitation is not visualized. No aortic stenosis is present. FINDINGS  Left Ventricle: Left ventricular ejection fraction, by estimation, is 60 to 65%. The left ventricle has normal function. The left ventricle has no regional wall motion abnormalities. The left ventricular internal cavity size was normal in size. There is  no left ventricular hypertrophy. Left ventricular diastolic parameters were normal. Right Ventricle: The right ventricular size is normal. Right vetricular wall thickness was not well visualized. Right ventricular systolic function is normal. Left Atrium: Left atrial size was normal in size. Right Atrium: Right atrial size was normal in size. Pericardium: There is no evidence of pericardial effusion. Mitral Valve: The mitral valve is normal in structure. Trivial mitral valve regurgitation. No evidence of mitral valve stenosis. Tricuspid Valve: The tricuspid valve is normal in  structure. Tricuspid valve regurgitation is trivial. Aortic Valve: The aortic valve is normal in structure. Aortic valve regurgitation is not visualized. No aortic stenosis is present. Pulmonic Valve: The pulmonic valve was normal in structure. Pulmonic valve regurgitation is not visualized. Aorta: The aortic root and ascending aorta are structurally normal, with no evidence of dilitation. IAS/Shunts: The atrial septum is grossly normal.  LEFT VENTRICLE PLAX 2D LVIDd:         4.60 cm Diastology LVIDs:         2.60 cm LV e' medial:    5.82 cm/s LV PW:         1.10 cm LV E/e' medial:  10.7 LV IVS:        2.00 cm LV e' lateral:   7.40 cm/s                        LV E/e' lateral: 8.4  RIGHT VENTRICLE RV S prime:     14.10 cm/s TAPSE (M-mode): 2.6 cm LEFT ATRIUM             Index        RIGHT ATRIUM           Index LA diam:        4.40 cm 2.14 cm/m   RA Area:     15.80 cm LA Vol (A2C):   51.0 ml 24.83 ml/m  RA Volume:   39.90 ml  19.42 ml/m LA Vol (A4C):   48.3 ml 23.51 ml/m LA Biplane Vol: 50.5 ml 24.58 ml/m   AORTA Ao Asc diam: 3.70 cm MITRAL VALVE MV Area (PHT): 3.57 cm MV Decel Time: 213 msec MV E velocity: 62.10 cm/s MV A velocity: 86.95 cm/s MV E/A ratio:  0.71 Mertie Moores MD Electronically  signed by Mertie Moores MD Signature Date/Time: 05/20/2021/10:40:25 AM    Final    ECHO TEE  Result Date: 05/24/2021    TRANSESOPHOGEAL ECHO REPORT   Patient Name:   JAMANI BEARCE Date of Exam: 05/24/2021 Medical Rec #:  [id number]        Height:       69.0 in Accession #:    [id number]       Weight:       197.3 lb Date of Birth:  [birth year]        BSA:          2.054 m Patient Age:    13 years         BP:           141/92 mmHg Patient Gender: M                HR:           93 bpm. Exam Location:  Inpatient Procedure: Transesophageal Echo, Color Doppler, Cardiac Doppler and Saline            Contrast Bubble Study Indications:     Bacteremia  History:         Patient has prior history of Echocardiogram  examinations, most                  recent 05/20/2021. Risk Factors:Hypertension and Dyslipidemia.  Sonographer:     Raquel Sarna Senior RDCS Referring Phys:  [id number] Tami Lin [facility] Diagnosing Phys: Cherlynn Kaiser MD PROCEDURE: After discussion of the risks and benefits of a TEE, an informed consent was obtained from the patient. The transesophogeal probe was passed without difficulty through the esophogus of the patient. Local oropharyngeal anesthetic was provided with Cetacaine. Sedation performed by different physician. The patient was monitored while under deep sedation. Anesthestetic sedation was provided intravenously by Anesthesiology: 739mg  of Propofol. The patient developed no complications during the procedure. IMPRESSIONS  1. Left ventricular ejection fraction, by estimation, is 60 to 65%. The left ventricle has normal function. There is severe asymmetric left ventricular hypertrophy of the basal-septal segment. There is flow acceleration at the basal septum, with evidence of LVOT obstruction and a dynamic, maximal instantaneous gradient of 66 mmHg (4.1 m/s). There is systolic anterior motion of the mitral valve with resultant mild-moderate mitral valve regurgitation. Findings are suspicious for hypertrophic cardiomyopathy. Consider Cardiac MRI as outpatient for further evaluation.  2. Right ventricular systolic function is normal. The right ventricular size is normal.  3. Left atrial size was mildly dilated. No left atrial/left atrial appendage thrombus was detected.  4. The mitral valve is grossly normal. Mild to moderate mitral valve regurgitation.  5. The aortic valve is tricuspid. There is moderate calcification of the aortic valve. Aortic valve regurgitation is not visualized.  6. Agitated saline contrast bubble study was positive with shunting observed after >6 cardiac cycles suggestive of intrapulmonary shunting. Conclusion(s)/Recommendation(s): No evidence of vegetation/infective endocarditis on  this transesophageal echocardiogram. FINDINGS  Left Ventricle: Left ventricular ejection fraction, by estimation, is 60 to 65%. The left ventricle has normal function. The left ventricular internal cavity size was normal in size. There is severe asymmetric left ventricular hypertrophy of the basal-septal segment. Right Ventricle: The right ventricular size is normal. No increase in right ventricular wall thickness. Right ventricular systolic function is normal. Left Atrium: Left atrial size was mildly dilated. No left atrial/left atrial appendage thrombus was detected. Right Atrium: Right atrial size was normal in size. Pericardium: There  is no evidence of pericardial effusion. Mitral Valve: The mitral valve is grossly normal. Mild to moderate mitral valve regurgitation. Tricuspid Valve: The tricuspid valve is normal in structure. Tricuspid valve regurgitation is trivial. No evidence of tricuspid stenosis. There is no evidence of tricuspid valve vegetation. Aortic Valve: The aortic valve is tricuspid. There is moderate calcification of the aortic valve. Aortic valve regurgitation is not visualized. Pulmonic Valve: The pulmonic valve was normal in structure. Pulmonic valve regurgitation is trivial. No evidence of pulmonic stenosis. There is no evidence of pulmonic valve vegetation. Aorta: The aortic root and ascending aorta are structurally normal, with no evidence of dilitation. IAS/Shunts: The interatrial septum appears to be lipomatous. No atrial level shunt detected by color flow Doppler. Agitated saline contrast was given intravenously to evaluate for intracardiac shunting. Agitated saline contrast bubble study was positive with shunting observed after >6 cardiac cycles suggestive of intrapulmonary shunting.  AORTIC VALVE LVOT Vmax:   110.00 cm/s LVOT Vmean:  78.300 cm/s LVOT VTI:    0.213 m  AORTA Ao Root diam: 3.40 cm Ao Asc diam:  3.70 cm  SHUNTS Systemic VTI: 0.21 m Cherlynn Kaiser MD Electronically signed  by Cherlynn Kaiser MD Signature Date/Time: 05/24/2021/7:14:08 PM    Final    Results for orders placed or performed during the hospital encounter of 05/18/21  Blood culture (routine x 2)     Status: Abnormal   Collection Time: 05/18/21  9:38 AM   Specimen: BLOOD  Result Value Ref Range Status   Specimen Description   Final    BLOOD LEFT ANTECUBITAL Performed at [name], [address] [place], [place] [address]    Special Requests   Final    BOTTLES DRAWN AEROBIC AND ANAEROBIC Blood Culture adequate volume Performed at [facility], [address] [place], [place] [address]    Culture  Setup Time   Final    GRAM POSITIVE COCCI IN PAIRS AND CHAINS IN BOTH AEROBIC AND ANAEROBIC BOTTLES CRITICAL VALUE NOTED.  VALUE IS CONSISTENT WITH PREVIOUSLY REPORTED AND CALLED VALUE.    Culture (A)  Final    STREPTOCOCCUS CONSTELLATUS SUSCEPTIBILITIES PERFORMED ON PREVIOUS CULTURE WITHIN THE LAST 5 DAYS. Performed at [facility], [place] [address] [place], [place] [address]    Report Status 05/21/2021 FINAL  Final  Blood culture (routine x 2)     Status: Abnormal   Collection Time: 05/18/21  9:48 AM   Specimen: BLOOD LEFT HAND  Result Value Ref Range Status   Specimen Description   Final    BLOOD LEFT HAND Performed at [facility], [address] [place], [place] [address]    Special Requests   Final    BOTTLES DRAWN AEROBIC AND ANAEROBIC Blood Culture adequate volume Performed at [facility], [address] [place], [place] [address]    Culture  Setup Time   Final    GRAM POSITIVE COCCI IN PAIRS AND CHAINS IN BOTH AEROBIC AND ANAEROBIC BOTTLES CRITICAL VALUE NOTED.  VALUE IS CONSISTENT WITH PREVIOUSLY REPORTED AND CALLED VALUE.    Culture (A)  Final    STREPTOCOCCUS CONSTELLATUS SUSCEPTIBILITIES PERFORMED ON PREVIOUS CULTURE WITHIN THE LAST 5 DAYS. Performed at [facility], [place] [address], [place], [place] [address]     Report Status 05/21/2021 FINAL  Final  Resp Panel by RT-PCR (Flu A&B, Covid) Nasopharyngeal Swab     Status: None   Collection Time:  05/18/21 10:24 AM   Specimen: Nasopharyngeal Swab; Nasopharyngeal(NP) swabs in vial transport medium  Result Value Ref Range Status   SARS Coronavirus 2 by RT PCR NEGATIVE NEGATIVE Final    Comment: (NOTE) SARS-CoV-2 target nucleic acids are NOT DETECTED.  The SARS-CoV-2 RNA is generally detectable in upper respiratory specimens during the acute phase of infection. The lowest concentration of SARS-CoV-2 viral copies this assay can detect is 138 copies/mL. A negative result does not preclude SARS-Cov-2 infection and should not be used as the sole basis for treatment or other patient management decisions. A negative result may occur with  improper specimen collection/handling, submission of specimen other than nasopharyngeal swab, presence of viral mutation(s) within the areas targeted by this assay, and inadequate number of viral copies(<138 copies/mL). A negative result must be combined with clinical observations, patient history, and epidemiological information. The expected result is Negative.  Fact Sheet for Patients:  EntrepreneurPulse.com.au  Fact Sheet for Healthcare Providers:  IncredibleEmployment.be  This test is no t yet approved or cleared by the [place] FDA and  has been authorized for detection and/or diagnosis of SARS-CoV-2 by FDA under an Emergency Use Authorization (EUA). This EUA will remain  in effect (meaning this test can be used) for the duration of the COVID-19 declaration under Section 564(b)(1) of the Act, 21 U.S.C.section 360bbb-3(b)(1), unless the authorization is terminated  or revoked sooner.       Influenza A by PCR NEGATIVE NEGATIVE Final   Influenza B by PCR NEGATIVE NEGATIVE Final    Comment: (NOTE) The Xpert Xpress SARS-CoV-2/FLU/RSV plus assay is intended as an aid in  the diagnosis of influenza from Nasopharyngeal swab specimens and should not be used as a sole basis for treatment. Nasal washings and aspirates are unacceptable for Xpert Xpress SARS-CoV-2/FLU/RSV testing.  Fact Sheet for Patients: EntrepreneurPulse.com.au  Fact Sheet for Healthcare Providers: IncredibleEmployment.be  This test is not yet approved or cleared by the [place] FDA and has been authorized for detection and/or diagnosis of SARS-CoV-2 by FDA under an Emergency Use Authorization (EUA). This EUA will remain in effect (meaning this test can be used) for the duration of the COVID-19 declaration under Section 564(b)(1) of the Act, 21 U.S.C. section 360bbb-3(b)(1), unless the authorization is terminated or revoked.  Performed at [facility], [address], [place], [place] [address]   Urine Culture     Status: None   Collection Time: 05/18/21 11:56 AM   Specimen: Urine, Clean Catch  Result Value Ref Range Status   Specimen Description   Final    URINE, CLEAN CATCH Performed at [facility], [address] [place], [place] [address]    Special Requests   Final    NONE Performed at [facility], [address] [place], [place] [address]    Culture   Final    NO GROWTH Performed at [facility], [place] [address] [place], [place] [address]    Report Status 05/19/2021 FINAL  Final  Culture, blood (routine x 2)     Status: None (Preliminary result)   Collection Time: 05/20/21  1:47 PM   Specimen: BLOOD  Result Value Ref Range Status   Specimen Description   Final    BLOOD RIGHT ANTECUBITAL Performed at [place] [address] [place], [place] [address]    Special Requests   Final    BOTTLES DRAWN AEROBIC ONLY Blood Culture adequate volume Performed at  [facility], [place] [address], [place], [place] [address]    Culture   Final    NO GROWTH 4  DAYS Performed at [facility], [place] [address], [place], [place] [address]    Report Status PENDING  Incomplete  Culture, blood (routine x 2)     Status: None (Preliminary result)   Collection Time: 05/20/21  1:52 PM   Specimen: BLOOD  Result Value Ref Range Status   Specimen Description   Final    BLOOD LEFT ANTECUBITAL Performed at [place] [address] [place], [place] [address]    Special Requests   Final    BOTTLES DRAWN AEROBIC ONLY Blood Culture adequate volume Performed at [place] [address], [place], [place] [address]    Culture   Final    NO GROWTH 4 DAYS Performed at [facility], [place] [address], [place], [place] [address]    Report Status PENDING  Incomplete    Signed:  Edwin Dada MD.  [facility] 05/24/2021, 5:00 PM

## 2021-05-25 NOTE — Anesthesia Postprocedure Evaluation (Addendum)
Anesthesia Post Note  Patient: Todd Novak  Procedure(s) Performed: TRANSESOPHAGEAL ECHOCARDIOGRAM (TEE) BUBBLE STUDY     Patient location during evaluation: PACU Anesthesia Type: MAC Level of consciousness: awake and alert Pain management: pain level controlled Vital Signs Assessment: post-procedure vital signs reviewed and stable Respiratory status: spontaneous breathing, nonlabored ventilation, respiratory function stable and patient connected to nasal cannula oxygen Cardiovascular status: stable and blood pressure returned to baseline Postop Assessment: no apparent nausea or vomiting Anesthetic complications: no   No notable events documented.  Last Vitals:  Vitals:   05/24/21 1420 05/24/21 1440  BP: 115/75 122/78  Pulse: 87 89  Resp: 13 19  Temp:    SpO2: 98% 96%    Last Pain:  Vitals:   05/24/21 1341  TempSrc:   PainSc: [place]

## 2021-05-27 ENCOUNTER — Encounter (HOSPITAL_COMMUNITY): Payer: Self-pay | Admitting: Internal Medicine

## 2021-05-28 ENCOUNTER — Telehealth: Payer: Self-pay

## 2021-05-28 NOTE — Telephone Encounter (Signed)
Transition Care Management Unsuccessful Follow-up Telephone Call  Date of discharge and from where:  05/24/2021-[place]  Attempts:  1st Attempt  Reason for unsuccessful TCM follow-up call:  No answer/busy

## 2021-05-28 NOTE — Telephone Encounter (Signed)
Transition Care Management Unsuccessful Follow-up Telephone Call  Date of discharge and from where:  05/24/2021-[place]  Attempts:  2nd Attempt  Reason for unsuccessful TCM follow-up call:  Left voice message

## 2021-05-29 ENCOUNTER — Telehealth: Payer: Self-pay | Admitting: Family Medicine

## 2021-05-29 NOTE — Telephone Encounter (Signed)
If he can barely swallow he needs to be seen.

## 2021-05-29 NOTE — Telephone Encounter (Signed)
Pt. Wife called in and stated pt was recently discharged from hospital. Since coming home pt has gotten worse. Stated pt throat is swollen to the point where he can barely swallow and breathe. Transferred pt wife to triage for further advice.

## 2021-05-29 NOTE — Telephone Encounter (Signed)
Patient has an appointment in the morning at [facility] office at 8:30. Informed the patients wife if he worsens at all before being seen in the morning to please go immediately to the ED. The wife verbalized understanding/had no further questions or concerns.

## 2021-05-30 ENCOUNTER — Ambulatory Visit (INDEPENDENT_AMBULATORY_CARE_PROVIDER_SITE_OTHER): Payer: No Typology Code available for payment source | Admitting: Family

## 2021-05-30 ENCOUNTER — Other Ambulatory Visit: Payer: Self-pay

## 2021-05-30 ENCOUNTER — Encounter: Payer: Self-pay | Admitting: Family

## 2021-05-30 VITALS — BP 129/89 | HR 124 | Temp 97.9°F | Ht 69.0 in | Wt 195.2 lb

## 2021-05-30 DIAGNOSIS — A409 Streptococcal sepsis, unspecified: Secondary | ICD-10-CM | POA: Diagnosis not present

## 2021-05-30 DIAGNOSIS — R6889 Other general symptoms and signs: Secondary | ICD-10-CM | POA: Diagnosis not present

## 2021-05-30 DIAGNOSIS — C9110 Chronic lymphocytic leukemia of B-cell type not having achieved remission: Secondary | ICD-10-CM | POA: Diagnosis not present

## 2021-05-30 DIAGNOSIS — K22719 Barrett's esophagus with dysplasia, unspecified: Secondary | ICD-10-CM

## 2021-05-30 DIAGNOSIS — Z8619 Personal history of other infectious and parasitic diseases: Secondary | ICD-10-CM

## 2021-05-30 DIAGNOSIS — Z09 Encounter for follow-up examination after completed treatment for conditions other than malignant neoplasm: Secondary | ICD-10-CM

## 2021-05-30 DIAGNOSIS — R5383 Other fatigue: Secondary | ICD-10-CM | POA: Diagnosis not present

## 2021-05-30 LAB — COMPREHENSIVE METABOLIC PANEL
ALT: 17 U/L (ref 0–53)
AST: 22 U/L (ref 0–37)
Albumin: 4.3 g/dL (ref 3.5–5.2)
Alkaline Phosphatase: 79 U/L (ref 39–117)
BUN: 12 mg/dL (ref 6–23)
CO2: 25 mEq/L (ref 19–32)
Calcium: 9.3 mg/dL (ref 8.4–10.5)
Chloride: 103 mEq/L (ref 96–112)
Creatinine, Ser: 0.91 mg/dL (ref 0.40–1.50)
GFR: 94.5 mL/min (ref >60.00)
Glucose, Bld: 134 mg/dL — ABNORMAL HIGH (ref 70–99)
Potassium: 4.1 mEq/L (ref 3.5–5.1)
Sodium: 138 mEq/L (ref 135–145)
Total Bilirubin: 0.8 mg/dL (ref 0.2–1.2)
Total Protein: 6.8 g/dL (ref 6.0–8.3)

## 2021-05-30 LAB — CBC WITH DIFFERENTIAL/PLATELET
Basophils Absolute: 0.1 10*3/uL (ref 0.0–0.1)
Basophils Relative: 0.3 % (ref 0.0–3.0)
Eosinophils Absolute: 0 10*3/uL (ref 0.0–0.7)
Eosinophils Relative: 0.1 % (ref 0.0–5.0)
HCT: 39.3 % (ref 39.0–52.0)
Hemoglobin: 13.2 g/dL (ref 13.0–17.0)
Lymphocytes Relative: 83 % — ABNORMAL HIGH (ref 12.0–46.0)
Lymphs Abs: 13.4 10*3/uL — ABNORMAL HIGH (ref 0.7–4.0)
MCHC: 33.6 g/dL (ref 30.0–36.0)
MCV: 93.2 fl (ref 78.0–100.0)
Monocytes Absolute: 0.7 10*3/uL (ref 0.1–1.0)
Monocytes Relative: 4.1 % (ref 3.0–12.0)
Neutro Abs: 2 10*3/uL (ref 1.4–7.7)
Neutrophils Relative %: 12.5 % — ABNORMAL LOW (ref 43.0–77.0)
Platelets: 115 10*3/uL — ABNORMAL LOW (ref 150.0–400.0)
RBC: 4.22 Mil/uL (ref 4.22–5.81)
RDW: 13.4 % (ref 11.5–15.5)
WBC: 16.1 10*3/uL — ABNORMAL HIGH (ref 4.0–10.5)

## 2021-05-30 LAB — POCT INFLUENZA A/B
Influenza A, POC: NEGATIVE
Influenza B, POC: NEGATIVE

## 2021-05-30 LAB — POC COVID19 BINAXNOW: SARS Coronavirus 2 Ag: NEGATIVE

## 2021-05-30 NOTE — Assessment & Plan Note (Addendum)
D/C from hospital last Friday, states his neck lymph nodes (pre&post cervical & pre-auricular) have become more swollen since home, he is still very weak, no fever today, but is taking Ibuprofen around the clock for pain. Testing for flu & covid today (both neg.) as he refused at hosp d/c. He is concerned that since starting the AMOX day after d/c, his swelling has worsened, it did start about 3 days prior to d/c while on IV PCN, but has worsened. Consulted with Dr. Jonni Sanger in office. Pt should remain on AMOX, will check lab results, pt advised to seek f/u with Oncologist asap.

## 2021-05-30 NOTE — Progress Notes (Signed)
Subjective:     Patient ID: Todd Novak, male    DOB: 04/18/1966, 55 y.o.   MRN: [id number]  Chief Complaint  Patient presents with   Facial Swelling    Pt was d/c charged from [facility] Friday. He started round of Amoxicillin once leaving. He says that the swelling started 3 days before he was d/c.    Shortness of Breath    On exertion.   Throat swelling    Follow up Hospitalization Patient was admitted to [facility] on 05/18/2021 and discharged on 05/24/2021. He was treated for sepsis due to streptococcus after having a dental bridge inserted. Treatment for this included IV antibiotics. He reports good compliance with treatment. He reports this condition is  gradually improving . He also has CLL whereby causing all of the lymph nodes around his neck to swell and become painful. He feels this has worsened since he has been home. Also reports still feeling very weak. Denies fever but has been on Ibuprofen round the clock due to pain. Also reports difficulty swallowing when he lays down, feels extra mucus in back of his throat.    Health Maintenance Due  Topic Date Due   Pneumococcal Vaccine 59-43 Years old (1 - PCV) Never done   Hepatitis C Screening  Never done   Zoster Vaccines- Shingrix (1 of 2) Never done   COVID-19 Vaccine (3 - Pfizer risk series) 03/12/2020   COLONOSCOPY (Pts 45-41yr Insurance coverage will need to be confirmed)  05/14/2020   INFLUENZA VACCINE  02/25/2021    Past Medical History:  Diagnosis Date   Asthma    childhood, resolved   Barrett's esophagus    C[place]    Environmental allergies    Fatigue    GERD (gastroesophageal reflux disease)    Heart murmur    HTN (hypertension)     Past Surgical History:  Procedure Laterality Date   APPENDECTOMY     BUBBLE STUDY  05/24/2021   Procedure: BUBBLE STUDY;  Surgeon: AElouise Munroe MD;  [place] [facility]  Service: Cardiology;;   CHOLECYSTECTOMY     TEE  WITHOUT CARDIOVERSION N/A 05/06/2017   Procedure: TRANSESOPHAGEAL ECHOCARDIOGRAM (TEE);  Surgeon: TSueanne Margarita MD;  [facility];  Service: Cardiovascular;  Laterality: N/A;   TEE WITHOUT CARDIOVERSION N/A 05/24/2021   Procedure: TRANSESOPHAGEAL ECHOCARDIOGRAM (TEE);  Surgeon: AElouise Munroe MD;  [place] [facility]  Service: Cardiology;  Laterality: N/A;   WISDOM TOOTH EXTRACTION      Outpatient Medications Prior to Visit  Medication Sig Dispense Refill   amoxicillin (AMOXIL) 500 MG capsule Take 1 capsule (500 mg total) by mouth 3 (three) times daily. 21 capsule 0   Ascorbic Acid (VITAMIN C) 100 MG tablet Take 100 mg by mouth daily.     carvedilol (COREG) 3.125 MG tablet Take 1 tablet (3.125 mg total) by mouth 2 (two) times daily. 180 tablet 3   famotidine (PEPCID) 20 MG tablet Take 1 tablet (20 mg total) by mouth at bedtime. 30 tablet 3   fexofenadine (ALLEGRA) 180 MG tablet Take 1 tablet (180 mg total) by mouth daily. (Patient taking differently: Take 180 mg by mouth every other day.) 30 tablet 5   LYSINE PO Take 1 tablet by mouth daily as needed (cold sores).     Multiple Vitamins-Minerals (ZINC PO) Take 1 tablet by mouth every other day.     omeprazole (PRILOSEC) 40 MG capsule Take 1 capsule (40 mg total)  by mouth in the morning and at bedtime. (Patient taking differently: Take 40 mg by mouth daily.) 90 capsule 3   rosuvastatin (CRESTOR) 10 MG tablet Take 1 tablet (10 mg total) by mouth daily. 90 tablet 3   acetaminophen (TYLENOL) 325 MG tablet Take 650 mg by mouth every 4 (four) hours as needed for mild pain, moderate pain or headache.  (Patient not taking: Reported on 05/30/2021)     No facility-administered medications prior to visit.    Allergies  Allergen Reactions   Codeine Nausea And Vomiting    Dizziness   Doxycycline Nausea Only   Methocarbamol Nausea Only    dizziness   Tramadol Nausea And Vomiting        Objective:    Physical Exam Vitals  and nursing note reviewed.  Constitutional:      General: He is not in acute distress.    Appearance: Normal appearance. He is ill-appearing.  HENT:     Head: Normocephalic.  Cardiovascular:     Rate and Rhythm: Normal rate and regular rhythm.  Pulmonary:     Effort: Pulmonary effort is normal.     Breath sounds: Normal breath sounds.  Musculoskeletal:        General: Normal range of motion.     Cervical back: Normal range of motion.  Lymphadenopathy:     Head:     Right side of head: Submental, submandibular and preauricular adenopathy present.     Left side of head: Submental, submandibular and preauricular adenopathy present.     Cervical: Cervical adenopathy present.     Right cervical: Superficial cervical adenopathy and posterior cervical adenopathy present.     Left cervical: Superficial cervical adenopathy and posterior cervical adenopathy present.     Comments: All lymph nodes swollen, firm, and tender to palpation, approx. 1.5cm in diameter.  Skin:    General: Skin is warm and dry.  Neurological:     Mental Status: He is alert and oriented to person, place, and time.  Psychiatric:        Mood and Affect: Mood normal.    BP 129/89   Pulse (!) 124   Temp 97.9 F (36.6 C) (Temporal)   Ht _0  (1.753 m)   Wt 195 lb 3.2 oz (88.5 kg)   SpO2 95%   BMI 28.83 kg/m  Wt Readings from Last 3 Encounters:  05/30/21 195 lb 3.2 oz (88.5 kg)  05/18/21 197 lb 5 oz (89.5 kg)  05/16/21 197 lb 5 oz (89.5 kg)       Assessment & Plan:   Problem List Items Addressed This Visit       Digestive   Barrett's esophagus with dysplasia    Pt having new sx: dysphagia, increased throat mucus, d/c from hospital & recovering from sepsis, weak, fatigued, advised he should increase his Prilosec to 2 pills qd or bid until completely recovered.        Other   Fatigue    D/C from hospital 1 week ago, combined with CLL, advised pt it will take him longer to recover.      Relevant  Orders   POCT Influenza A/B (Completed)   POC COVID-19 (Completed)   CLL (chronic lymphocytic leukemia) ([place])    D/C from hospital last Friday, states his neck lymph nodes (pre&post cervical & pre-auricular) have become more swollen since home, he is still very weak, no fever today, but is taking Ibuprofen around the clock for pain. Testing for flu & covid  today (both neg.) as he refused at hosp d/c. He is concerned that since starting the AMOX day after d/c, his swelling has worsened, it did start about 3 days prior to d/c while on IV PCN, but has worsened. Consulted with Dr. Jonni Sanger in office. Pt should remain on AMOX, will check lab results, pt advised to seek f/u with Oncologist asap.      Relevant Orders   CBC with Differential/Platelet (Completed)   Sepsis due to Streptococcus species [facility])    Hospital 10/20-10/28/2022. Believed to start after having a dental bridge procedure, did not receive prophylactic abt. Pt still weak, cervical lymphadenopathy, off & on low grade fever. Rechecking labs today.      Relevant Orders   CBC with Differential/Platelet (Completed)   Comp Met (CMET) (Completed)   Hospital discharge follow-up - Primary    10/20-10/28/2022 Sepsis - Streptococcus after having dental bridge placed      Flu-like symptoms    Fatigue, off & on low grade fever, did not do offered testing at hospital d/c, flu & covid rapid tests today are neg.        Relevant Orders   POCT Influenza A/B (Completed)   POC COVID-19 (Completed)    No orders of the defined types were placed in this encounter.

## 2021-05-30 NOTE — Patient Instructions (Addendum)
It was nice to see you today.  Go to the lab for blood work. We should get these results later today, and I will message you through [place]. Remain on the Amoxicillin as directed. Continue Ibuprofen every 6-8hours for pain. If fever spikes >100, go to the ER.  Increase your Omeprazole to 2 pills daily or 1 pill twice a day for the next week at least. Must to try to get in 64oz water daily - this will help you battle your fatigue as well as eating small meals through the day as tolerated.    PLEASE NOTE:  If you had any lab tests please let us know if you have not heard back within a few days. You may see your results on mychart before we have a chance to review them but we will give you a call once they are reviewed by [place]. If we ordered any referrals today, please let us know if you have not heard from their office within the next week.   Please try these tips to maintain a healthy lifestyle:  Eat most of your calories during the day when you are active. Eliminate processed foods including packaged sweets (pies, cakes, cookies), reduce intake of potatoes, white bread, white pasta, and white rice. Look for whole grain options, oat flour or almond flour.  Each meal should contain half fruits/vegetables, one quarter protein, and one quarter carbs (no bigger than a computer mouse).  Cut down on sweet beverages. This includes juice, soda, and sweet tea. Also watch fruit intake, though this is a healthier sweet option, it still contains natural sugar! Limit to 3 servings daily.  Drink at least 1 glass of water with each meal and aim for at least 8 glasses per day  Exercise at least 150 minutes every week.

## 2021-05-30 NOTE — Telephone Encounter (Signed)
See notes below. Pt has appt 05/30/2021.   [facility] - ClientTELEPHONE ADVICE RECORDAccessNurse PatientName:Todd Novak [phone](Primary) Initial Comment Caller states her was in the hospital for about a week and discharged this past Friday and is getting worse. She is wondering if his antibiotics need to be changed. His throat is extremely swollen. His lymph nodes are popping out the side of his neck and he can barely swallow or talk. He feels extremely drained and hasn't been able to sleep well due to trouble breathing. [place] Not Listed Didnt specify. Translation No Nurse Assessment Nurse: Raenette Rover, RN, Zella Ball Date/Time Eilene Ghazi Time): 05/29/2021 3:47:27 PM Confirm and document reason for call. If symptomatic, describe symptoms. ---Caller states her was in the hospital for about a week and discharged this past Friday and is getting worse. She is wondering if his antibiotics need to be changed. His throat is extremely swollen. His lymph nodes are popping out the side of his neck and he can barely swallow or talk. He feels extremely drained and hasn't been able to sleep well due to trouble breathing. Caller is not with the patient. Does the patient have any new or worsening symptoms? ---Yes Will a triage be completed? ---Yes Related visit to physician within the last 2 weeks? ---Yes Does the PT have any chronic conditions? (i.e. diabetes, asthma, this includes High risk factors for pregnancy, etc.) ---Yes List chronic conditions. ---CLO Is this a behavioral health or substance abuse call? ---No PLEASE NOTE: All timestamps contained within this report are represented as [place] Standard Time. CONFIDENTIALTY NOTICE: This fax transmission is intended only for the addressee. It contains information that is legally privileged, confidential or otherwise protected from use or disclosure. If you are not the intended recipient, you are  strictly prohibited from reviewing, disclosing, copying using or disseminating any of this information or taking any action in reliance on or regarding this information. If you have received this fax in error, please notify us immediately by telephone so that we can arrange for its return to [place]. Phone: [phone], Toll-Free: [phone], Fax: [phone] Page: 2 of 2 Call Id: [id number] Guidelines Guideline Title Affirmed Question Affirmed Notes Nurse Date/Time Eilene Ghazi Time) Infection on Antibiotic Follow-up Call Patient sounds very sick or weak to the triager Raenette Rover, RN, Zella Ball 05/29/2021 3:51:41 PM Disp. Time Eilene Ghazi Time) Disposition Final User 05/29/2021 3:44:09 PM Send to Urgent Harrie Jeans 05/29/2021 3:57:31 PM Go to ED Now (or PCP triage) Yes Raenette Rover, RN, Herbert Deaner Disagree/Comply Comply Caller Understands Yes PreDisposition InappropriateToAsk Care Advice Given Per Guideline GO TO ED NOW (OR PCP TRIAGE): ANOTHER ADULT SHOULD DRIVE: * It is better and safer if another adult drives instead of you. BRING MEDICINES: CARE ADVICE given per Infection on Antibiotics Follow-Up Call (Adult) guideline. * Bring a list of your current medicines when you go to the Emergency Department (ER). Comments User: Wilson Singer, RN Date/Time Eilene Ghazi Time): 05/29/2021 3:49:23 PM Was in the hospital for bacteria in the blood and released. Still on Amoxicillian. User: Wilson Singer, RN Date/Time Eilene Ghazi Time): 05/29/2021 3:51:34 PM Has CLL medical history. User: Wilson Singer, RN Date/Time Eilene Ghazi Time): 05/29/2021 4:01:34 PM Caller states he needs to talk with his wife about it but will probably go to the hospital. He still wanted transferred to the office to schedule an apt for later. User: Wilson Singer, RN Date/Time Eilene Ghazi Time): 05/29/2021 4:02:58 PM Added patient to the call [phone] Referrals [place]

## 2021-05-31 DIAGNOSIS — R6889 Other general symptoms and signs: Secondary | ICD-10-CM | POA: Insufficient documentation

## 2021-05-31 NOTE — Assessment & Plan Note (Signed)
10/20-10/28/2022 Sepsis - Streptococcus after having dental bridge placed

## 2021-05-31 NOTE — Assessment & Plan Note (Addendum)
Hospital 10/20-10/28/2022. Believed to start after having a dental bridge procedure, did not receive prophylactic abt. Pt still weak, cervical lymphadenopathy, off & on low grade fever. Rechecking labs today.

## 2021-05-31 NOTE — Assessment & Plan Note (Signed)
Pt having new sx: dysphagia, increased throat mucus, d/c from hospital & recovering from sepsis, weak, fatigued, advised he should increase his Prilosec to 2 pills qd or bid until completely recovered.

## 2021-05-31 NOTE — Assessment & Plan Note (Addendum)
Fatigue, off & on low grade fever, did not do offered testing at hospital d/c, flu & covid rapid tests today are neg.

## 2021-05-31 NOTE — Assessment & Plan Note (Signed)
D/C from hospital 1 week ago, combined with CLL, advised pt it will take him longer to recover.

## 2021-06-01 ENCOUNTER — Other Ambulatory Visit: Payer: Self-pay

## 2021-06-01 ENCOUNTER — Emergency Department (HOSPITAL_COMMUNITY)
Admission: EM | Admit: 2021-06-01 | Discharge: 2021-06-01 | Disposition: A | Discharge status: Home / Self Care | Payer: No Typology Code available for payment source | Attending: Emergency Medicine | Admitting: Emergency Medicine

## 2021-06-01 ENCOUNTER — Emergency Department (HOSPITAL_COMMUNITY): Payer: No Typology Code available for payment source

## 2021-06-01 ENCOUNTER — Encounter (HOSPITAL_COMMUNITY): Payer: Self-pay | Admitting: Oncology

## 2021-06-01 DIAGNOSIS — J45909 Unspecified asthma, uncomplicated: Secondary | ICD-10-CM | POA: Diagnosis not present

## 2021-06-01 DIAGNOSIS — F17290 Nicotine dependence, other tobacco product, uncomplicated: Secondary | ICD-10-CM | POA: Insufficient documentation

## 2021-06-01 DIAGNOSIS — R591 Generalized enlarged lymph nodes: Secondary | ICD-10-CM

## 2021-06-01 DIAGNOSIS — R590 Localized enlarged lymph nodes: Secondary | ICD-10-CM | POA: Insufficient documentation

## 2021-06-01 DIAGNOSIS — G4700 Insomnia, unspecified: Secondary | ICD-10-CM | POA: Insufficient documentation

## 2021-06-01 DIAGNOSIS — I10 Essential (primary) hypertension: Secondary | ICD-10-CM | POA: Diagnosis not present

## 2021-06-01 DIAGNOSIS — Z79899 Other long term (current) drug therapy: Secondary | ICD-10-CM | POA: Diagnosis not present

## 2021-06-01 LAB — COMPREHENSIVE METABOLIC PANEL
ALT: 18 U/L (ref 0–44)
AST: 28 U/L (ref 15–41)
Albumin: 3.9 g/dL (ref 3.5–5.0)
Alkaline Phosphatase: 71 U/L (ref 38–126)
Anion gap: 6 (ref 5–15)
BUN: 12 mg/dL (ref 6–20)
CO2: 24 mmol/L (ref 22–32)
Calcium: 9.1 mg/dL (ref 8.9–10.3)
Chloride: 106 mmol/L (ref 98–111)
Creatinine, Ser: 0.8 mg/dL (ref 0.61–1.24)
GFR, Estimated: >60 mL/min (ref >60)
Glucose, Bld: 124 mg/dL — ABNORMAL HIGH (ref 70–99)
Potassium: 4.2 mmol/L (ref 3.5–5.1)
Sodium: 136 mmol/L (ref 135–145)
Total Bilirubin: 1 mg/dL (ref 0.3–1.2)
Total Protein: 6.6 g/dL (ref 6.5–8.1)

## 2021-06-01 LAB — CBC WITH DIFFERENTIAL/PLATELET
Abs Immature Granulocytes: 0.02 10*3/uL (ref 0.00–0.07)
Basophils Absolute: 0 10*3/uL (ref 0.0–0.1)
Basophils Relative: 0 %
Eosinophils Absolute: 0 10*3/uL (ref 0.0–0.5)
Eosinophils Relative: 0 %
HCT: 35.6 % — ABNORMAL LOW (ref 39.0–52.0)
Hemoglobin: 12.2 g/dL — ABNORMAL LOW (ref 13.0–17.0)
Immature Granulocytes: 0 %
Lymphocytes Relative: 82 %
Lymphs Abs: 9.3 10*3/uL — ABNORMAL HIGH (ref 0.7–4.0)
MCH: 31.8 pg (ref 26.0–34.0)
MCHC: 34.3 g/dL (ref 30.0–36.0)
MCV: 92.7 fL (ref 80.0–100.0)
Monocytes Absolute: 0.6 10*3/uL (ref 0.1–1.0)
Monocytes Relative: 5 %
Neutro Abs: 1.6 10*3/uL — ABNORMAL LOW (ref 1.7–7.7)
Neutrophils Relative %: 13 %
Platelets: 98 10*3/uL — ABNORMAL LOW (ref 150–400)
RBC: 3.84 MIL/uL — ABNORMAL LOW (ref 4.22–5.81)
RDW: 13.1 % (ref 11.5–15.5)
WBC: 11.5 10*3/uL — ABNORMAL HIGH (ref 4.0–10.5)
nRBC: 0 % (ref 0.0–0.2)

## 2021-06-01 LAB — I-STAT CHEM 8, ED
BUN: 10 mg/dL (ref 6–20)
Calcium, Ion: 1.17 mmol/L (ref 1.15–1.40)
Chloride: 105 mmol/L (ref 98–111)
Creatinine, Ser: 0.8 mg/dL (ref 0.61–1.24)
Glucose, Bld: 121 mg/dL — ABNORMAL HIGH (ref 70–99)
HCT: 34 % — ABNORMAL LOW (ref 39.0–52.0)
Hemoglobin: 11.6 g/dL — ABNORMAL LOW (ref 13.0–17.0)
Potassium: 4.1 mmol/L (ref 3.5–5.1)
Sodium: 139 mmol/L (ref 135–145)
TCO2: 25 mmol/L (ref 22–32)

## 2021-06-01 MED ORDER — IOHEXOL 350 MG/ML SOLN
100.0000 mL | Freq: Once | INTRAVENOUS | Status: AC | PRN
  Administered 2021-06-01: 100 mL via INTRAVENOUS

## 2021-06-01 MED ORDER — SODIUM CHLORIDE 0.9 % IV BOLUS
500.0000 mL | Freq: Once | INTRAVENOUS | Status: AC
  Administered 2021-06-01: 500 mL via INTRAVENOUS

## 2021-06-01 MED ORDER — ONDANSETRON HCL 4 MG/2ML IJ SOLN
4.0000 mg | Freq: Once | INTRAMUSCULAR | Status: AC
  Administered 2021-06-01: 4 mg via INTRAVENOUS
  Filled 2021-06-01: qty 2

## 2021-06-01 NOTE — ED Triage Notes (Signed)
Pt presents d/t b/l facial, lymph node swelling.  Was d/c'd on Friday after admission for sepsis.  Has followed up w/ PCP.  Pt reports difficulty sleeping d/t inability to catch his breath while laying down.  Pt also reports difficulty swallowing as well as blurry vision.  Swelling moving up face to eyes.

## 2021-06-01 NOTE — ED Provider Notes (Signed)
[facility] [place] [facility] DEPT Provider Note   CSN: [id number] Arrival date & time: 06/01/21  1508     History Chief Complaint  Patient presents with   Oral Swelling    Todd Novak is a 55 y.o. male.  55 year old male with medical history as detailed below presents for evaluation.  Patient with recent admission for bacteremia and sepsis.  Patient was discharged on May 24, 2021.  He was discharged with oral amoxicillin for treatment of strep bacteremia.  He returns today complaining of worsening swelling of his lymph nodes in his neck and underneath his jaw.  This makes it difficult for him to swallow.  Patient also complains of difficulty sleeping secondary to discomfort related to the swelling.  Patient reports that this neck swelling has been ongoing for at least the last week.  His symptoms were present during admission.  Medical history is significant for history of CLL.  He is currently not on any treatment for same.  He is followed by Dr. Cruzita Lederer ([place]) for CLL.  He is scheduled for PET scan later this week.  He denies fever.  He denies chest pain or shortness of breath.  He denies facial edema.  He is able to speak in full sentences during this exam.  He appears to be fairly comfortable.  The history is provided by the patient, the spouse and medical records.  Illness Location:  Lymphadenopathy in neck Severity:  Moderate Onset quality:  Gradual Duration:  1 week Timing:  Constant Progression:  Worsening Chronicity:  New     Past Medical History:  Diagnosis Date   Asthma    childhood, resolved   Barrett's esophagus    CLL (chronic lymphocytic leukemia) (HCC)    Environmental allergies    Fatigue    GERD (gastroesophageal reflux disease)    Heart murmur    HTN (hypertension)     Patient Active Problem List   Diagnosis Date Noted   Flu-like symptoms 05/31/2021   Hospital discharge follow-up 05/30/2021    Hyperlipidemia 05/22/2021   Thrombocytopenia ([place]) 05/22/2021   Sepsis due to Streptococcus species ([place]) 05/18/2021   Environmental allergies 03/25/2021   GAD (generalized anxiety disorder) 02/22/2021   Barrett's esophagus with dysplasia 10/17/2020   Exercise-induced asthma 08/23/2018   Recurrent cold sores 08/23/2018   Erectile dysfunction 08/23/2018   Bell's palsy 11/23/2017   Vaccine counseling 05/25/2017   Acute pain of left shoulder 05/04/2017   Sepsis due to Streptococcus agalactiae ([place]) 05/03/2017   Sebaceous cyst 03/25/2016   Palpitations 05/09/2015   Aortic stenosis 05/09/2015   CLL (chronic lymphocytic leukemia) ([place]) 04/21/2015   Abnormal finding on EKG 04/21/2015   Degeneration of intervertebral disc of lumbar region 08/22/2014   Bilateral lumbar radiculopathy 06/21/2014   Abdominal pain, chronic, epigastric 11/28/2013   Fatigue 11/28/2013   Essential hypertension 11/28/2013   GERD (gastroesophageal reflux disease) 11/28/2013    Past Surgical History:  Procedure Laterality Date   APPENDECTOMY     BUBBLE STUDY  05/24/2021   Procedure: BUBBLE STUDY;  Surgeon: Elouise Munroe, MD;  Location: [facility] ENDOSCOPY;  Service: Cardiology;;   CHOLECYSTECTOMY     TEE WITHOUT CARDIOVERSION N/A 05/06/2017   Procedure: TRANSESOPHAGEAL ECHOCARDIOGRAM (TEE);  Surgeon: Sueanne Margarita, MD;  Location: [facility] ENDOSCOPY;  Service: Cardiovascular;  Laterality: N/A;   TEE WITHOUT CARDIOVERSION N/A 05/24/2021   Procedure: TRANSESOPHAGEAL ECHOCARDIOGRAM (TEE);  Surgeon: Elouise Munroe, MD;  Location: [place];  Service: Cardiology;  Laterality: N/A;  WISDOM TOOTH EXTRACTION         Family History  Problem Relation Age of Onset   Hypertension Mother        Living   Hypertension Father 34       Deceased   Healthy Sister    Hypertension Maternal Grandmother    Hypertension Maternal Grandfather    Healthy Daughter        x2   Healthy Son        x1   Cancer Neg Hx     Diabetes Neg Hx    Heart disease Neg Hx     Social History   Tobacco Use   Smoking status: Some Days    Types: Cigars   Smokeless tobacco: Never  Vaping Use   Vaping Use: Never used  Substance Use Topics   Alcohol use: Yes    Alcohol/week: 3.0 standard drinks    Types: 3 Glasses of wine per week    Comment: weekends   Drug use: Not Currently    Types: Marijuana    Comment: as a youngster    Home Medications Prior to Admission medications   Medication Sig Start Date End Date Taking? Authorizing Provider  acetaminophen (TYLENOL) 325 MG tablet Take 650 mg by mouth every 4 (four) hours as needed for mild pain, moderate pain or headache.  Patient not taking: Reported on 05/30/2021    [provider]  amoxicillin (AMOXIL) 500 MG capsule Take 1 capsule (500 mg total) by mouth 3 (three) times daily. 05/24/21   Danford, Suann Larry, MD  Ascorbic Acid (VITAMIN C) 100 MG tablet Take 100 mg by mouth daily.    [provider]  carvedilol (COREG) 3.125 MG tablet Take 1 tablet (3.125 mg total) by mouth 2 (two) times daily. 04/04/21 07/03/21  Tobb, Kardie, DO  famotidine (PEPCID) 20 MG tablet Take 1 tablet (20 mg total) by mouth at bedtime. 02/26/21   Mauri Pole, MD  fexofenadine (ALLEGRA) 180 MG tablet Take 1 tablet (180 mg total) by mouth daily. Patient taking differently: Take 180 mg by mouth every other day. 05/30/16   Debbrah Alar, NP  LYSINE PO Take 1 tablet by mouth daily as needed (cold sores).    [provider]  Multiple Vitamins-Minerals (ZINC PO) Take 1 tablet by mouth every other day.    [provider]  omeprazole (PRILOSEC) 40 MG capsule Take 1 capsule (40 mg total) by mouth in the morning and at bedtime. Patient taking differently: Take 40 mg by mouth daily. 02/26/21   Mauri Pole, MD  rosuvastatin (CRESTOR) 10 MG tablet Take 1 tablet (10 mg total) by mouth daily. 04/04/21 07/03/21  Tobb, Godfrey Pick, DO    Allergies    Codeine,  Doxycycline, Methocarbamol, and Tramadol  Review of Systems   Review of Systems  All other systems reviewed and are negative.  Physical Exam Updated Vital Signs BP (!) 148/100 (BP Location: Left Arm)   Pulse (!) 117   Temp 98.4 F (36.9 C) (Oral)   Resp 20   Ht 5\' 9"  (1.753 m)   Wt 88.5 kg   SpO2 98%   BMI 28.80 kg/m   Physical Exam Vitals and nursing note reviewed.  Constitutional:      General: He is not in acute distress.    Appearance: Normal appearance. He is well-developed.     Comments: Patient is speaking in full sentences.  Patient is handling his own secretions.  Voice is without change.  HENT:     Head: Normocephalic and atraumatic.     Mouth/Throat:     Comments: Moderate lymphadenopathy noted to the anterior cervical neck and submandibular space. Eyes:     Conjunctiva/sclera: Conjunctivae normal.     Pupils: Pupils are equal, round, and reactive to light.  Cardiovascular:     Rate and Rhythm: Normal rate and regular rhythm.     Heart sounds: Normal heart sounds.  Pulmonary:     Effort: Pulmonary effort is normal. No respiratory distress.     Breath sounds: Normal breath sounds.  Abdominal:     General: There is no distension.     Palpations: Abdomen is soft.     Tenderness: There is no abdominal tenderness.  Musculoskeletal:        General: No deformity. Normal range of motion.     Cervical back: Normal range of motion and neck supple.  Skin:    General: Skin is warm and dry.  Neurological:     General: No focal deficit present.     Mental Status: He is alert and oriented to person, place, and time.    ED Results / Procedures / Treatments   Labs (all labs ordered are listed, but only abnormal results are displayed) Labs Reviewed  COMPREHENSIVE METABOLIC PANEL - Abnormal; Notable for the following components:      Result Value   Glucose, Bld 124 (*)    All other components within normal limits  CBC WITH DIFFERENTIAL/PLATELET - Abnormal; Notable  for the following components:   WBC 11.5 (*)    RBC 3.84 (*)    Hemoglobin 12.2 (*)    HCT 35.6 (*)    Platelets 98 (*)    Neutro Abs 1.6 (*)    Lymphs Abs 9.3 (*)    All other components within normal limits  I-STAT CHEM 8, ED - Abnormal; Notable for the following components:   Glucose, Bld 121 (*)    Hemoglobin 11.6 (*)    HCT 34.0 (*)    All other components within normal limits    EKG EKG Interpretation  Date/Time:  Saturday June 01 2021 16:22:34 EDT Ventricular Rate:  91 PR Interval:  173 QRS Duration: 97 QT Interval:  365 QTC Calculation: 450 R Axis:   62 Text Interpretation: Sinus rhythm Atrial premature complexes Probable left ventricular hypertrophy Inferior infarct, age indeterminate Anterior ST elevation, probably due to LVH Lateral leads are also involved Confirmed by Dene Gentry [phone]) on 06/01/2021 4:23:58 PM  Radiology CT Soft Tissue Neck W Contrast  Result Date: 06/01/2021 CLINICAL DATA:  Neck mass. Enlarged submandibular lymph nodes. Worsening facial swelling with difficulty breathing and swallowing. Recent sepsis. History of CLL. EXAM: CT NECK WITH CONTRAST TECHNIQUE: Multidetector CT imaging of the neck was performed using the standard protocol following the bolus administration of intravenous contrast. CONTRAST:  16mL OMNIPAQUE IOHEXOL 350 MG/ML SOLN COMPARISON:  None. FINDINGS: Pharynx and larynx: Moderate symmetric palatine tonsillar enlargement bilaterally with narrowing of the upper oropharyngeal airway. No discrete focal mass. No fluid collection or inflammatory changes in the parapharyngeal or retropharyngeal spaces. Salivary glands: Multiple bilateral parotid/periparotid masses consistent with enlarged lymph nodes. Unremarkable submandibular glands. Thyroid: Unremarkable. Lymph nodes: Widespread bulky symmetric lymphadenopathy diffusely involving anterior and posterior stations throughout the neck. Submandibular lymph nodes measure up to 2-2.1 cm in  short axis dimension bilaterally. Lymph nodes are of uniform density without cystic changes/necrosis. Vascular: Major vascular structures of the neck are grossly patent. Compression of the left internal  jugular vein by lymphadenopathy. Limited intracranial: Unremarkable. Visualized orbits: Unremarkable. Mastoids and visualized paranasal sinuses: Mild mucosal thickening in the maxillary sinuses. Clear mastoid air cells. Skeleton: No suspicious osseous lesion. Moderate disc degeneration at C5-6 and C6-7 with neural foraminal stenosis due to uncovertebral spurring as well as at least mild spinal stenosis at both levels. Upper chest: Reported separately. Other: None. IMPRESSION: Widespread bulky cervical lymphadenopathy consistent with lymphoproliferative disease. Electronically Signed   By: Logan Bores M.D.   On: 06/01/2021 17:33   CT Chest W Contrast  Result Date: 06/01/2021 CLINICAL DATA:  Hematologic malignancy. Worsening facial swelling and difficulty breathing. EXAM: CT CHEST WITH CONTRAST TECHNIQUE: Multidetector CT imaging of the chest was performed during intravenous contrast administration. CONTRAST:  164mL OMNIPAQUE IOHEXOL 350 MG/ML SOLN COMPARISON:  May 03, 2017 FINDINGS: Cardiovascular: No significant vascular findings. Normal heart size. No pericardial effusion. Mediastinum/Nodes: Numerous enlarged lymph nodes in the mediastinum bilateral axillary regions and supraclavicular regions. Index right paratracheal lymph node measures 1.4 cm in short axis. The largest lymph nodes are located in the axilla with index left axillary lymph node measuring 2.5 cm in short axis. Lungs/Pleura: Lungs are clear. No pleural effusion or pneumothorax. Upper Abdomen: Splenomegaly.  The spleen measures 16 cm in length. Musculoskeletal: No fracture is seen. IMPRESSION: 1. Numerous enlarged lymph nodes in the mediastinum, bilateral axillary regions and supraclavicular regions. The lymphadenopathy has progressed when  compared to October 2018. 2. Splenomegaly. 3. No evidence of pulmonary nodules. Electronically Signed   By: Fidela Salisbury M.D.   On: 06/01/2021 17:40    Procedures Procedures   Medications Ordered in ED Medications  sodium chloride 0.9 % bolus 500 mL (has no administration in time range)    ED Course  I have reviewed the triage vital signs and the nursing notes.  Pertinent labs & imaging results that were available during my care of the patient were reviewed by me and considered in my medical decision making (see chart for details).    MDM Rules/Calculators/A&P                           MDM  MSE complete  Todd Novak was evaluated in Emergency Department on 06/01/2021 for the symptoms described in the history of present illness. He was evaluated in the context of the global COVID-19 pandemic, which necessitated consideration that the patient might be at risk for infection with the SARS-CoV-2 virus that causes COVID-19. Institutional protocols and algorithms that pertain to the evaluation of patients at risk for COVID-19 are in a state of rapid change based on information released by regulatory bodies including the CDC and federal and state organizations. These policies and algorithms were followed during the patient's care in the ED.  Patient with longstanding history of CLL who presents with complaint of worsening lymphadenopathy to his neck - lymphoadenopathy has reportedly progressed over the last 2-3 weeks.  Patient's described symptoms are not associated with dysphonia, dysphagia, inability to handle secretions, or shortness of breath. No hypoxia noted. Speech is normal. No evidence of SVC obstruction on exam.   Patient's case and imaging reviewed with Dr. Chancy Milroy (covering for Dr. Cruzita Lederer).  She agrees that the patient does not require admission.  She agrees that patient would not benefit from the use of steroids currently.  She does agree that close follow-up on Monday with  Dr. Cruzita Lederer is appropriate.  Patient and his wife understand plan of care.  They understand  need for close follow-up.  Strict return precautions given and understood.  Final Clinical Impression(s) / ED Diagnoses Final diagnoses:  Lymphadenopathy    Rx / DC Orders ED Discharge Orders     None        Valarie Merino, MD 06/01/21 1925

## 2021-06-01 NOTE — Discharge Instructions (Signed)
Return for any problems.  You should contact Dr. Cruzita Lederer on Monday as instructed.  You will require additional close work-up this week.  You may use Benadryl - 25 mg orally - as needed for sleep assistance.

## 2021-06-01 NOTE — ED Provider Notes (Signed)
Emergency Medicine Provider Triage Evaluation Note  Todd DUCKETT , a 55 y.o. male  was evaluated in triage.  Pt complains of worsening facial swelling, difficulty breathing, difficulty swallowing.  Patient states admitted to the hospital and discharged on May 24, 2021.  Reports diagnosis of sepsis, treated with IV penicillin and discharged home on amoxicillin which she has been compliant with.  Reports history of CLL, monitored by Dr. Fenton Foy, not currently undergoing any treatment however is planned for PET scan.. Reports worsening of swelling of his lymph nodes around his submandibular space.  Unable to lay down and sleep as this causes significant difficulty breathing.  Followed up with his doctor this week and was advised to continue with his medications.  Review of Systems  Positive: Throat swelling, difficulty breathing Negative: Fever  Physical Exam  BP (!) 148/100 (BP Location: Left Arm)   Pulse (!) 117   Temp 98.4 F (36.9 C) (Oral)   Resp 20   Ht 5\' 9"  (1.753 m)   Wt 88.5 kg   SpO2 98%   BMI 28.80 kg/m  Gen:   Awake, no distress   Resp:  Normal effort  MSK:   Moves extremities without difficulty  Other:  Significant submandibular lymphadenopathy which is tender, tolerating secretions, able to speak without difficulty.  Medical Decision Making  Medically screening exam initiated at 3:36 PM.  Appropriate orders placed.  FRANCISCOJAVIER WRONSKI was informed that the remainder of the evaluation will be completed by another provider, this initial triage assessment does not replace that evaluation, and the importance of remaining in the ED until their evaluation is complete.     Tacy Learn, PA-C 06/01/21 [address], Ankit, MD 06/01/21 1825

## 2021-06-01 NOTE — ED Notes (Signed)
Patient given dinner tray and soda.

## 2021-06-03 ENCOUNTER — Telehealth: Payer: Self-pay

## 2021-06-03 ENCOUNTER — Ambulatory Visit: Payer: PRIVATE HEALTH INSURANCE | Admitting: Family Medicine

## 2021-06-03 NOTE — Telephone Encounter (Signed)
Pt has appointment with PCP today, 06/03/2021.   [facility] - ClientTELEPHONE ADVICE RECORDAccessNurse PatientName:Ramere Cuff PhoneNumber:[phone]([phone](Secondary) Initial Comment Caller states husband was d/c 10/28; on amoxicillin; saw MD yesterday and to take amoxicillin; still having difficulty breathing when trying to sleep; his mouth opens and he begins gasping for air d/t lymph node swelling; having some SOB now. [place] Not Listed States will reach out to oncologist Translation No Nurse Assessment Nurse: Micki Riley, RN, Domenick Gong Date/Time (Eastern Time): 05/31/2021 9:18:41 AM Confirm and document reason for call. If symptomatic, describe symptoms. ---Caller states spouse was admitted to hospital from 10/22-10/28 rx amoxicillin, stopped it last night. Unsure if that is what is causing his enlarged lymph nodes (are usually enlarged due to CLL), md aware; first noted prior to discharge. Has been having difficulty breathing. Denies any other symptoms. Does the patient have any new or worsening symptoms? ---Yes Will a triage be completed? ---Yes Related visit to physician within the last 2 weeks? ---Yes Does the PT have any chronic conditions? (i.e. diabetes, asthma, this includes High risk factors for pregnancy, etc.) ---Unknown Is this a behavioral health or substance abuse call? ---No PLEASE NOTE: All timestamps contained within this report are represented as [place] Standard Time. CONFIDENTIALTY NOTICE: This fax transmission is intended only for the addressee. It contains information that is legally privileged, confidential or otherwise protected from use or disclosure. If you are not the intended recipient, you are strictly prohibited from reviewing, disclosing, copying using or disseminating any of this information or taking any action in reliance on or regarding this information. If you have received this fax in error,  please notify us immediately by telephone so that we can arrange for its return to [place]. Phone: [phone], Toll-Free: [phone], Fax: [phone] Page: 2 of 2 Call Id: [id number] Guidelines Guideline Title Affirmed Question Affirmed Notes Nurse Date/Time Eilene Ghazi Time) Lymph Nodes - Swollen [1] Node is in the neck AND [2] causes difficulty breathing Cazares, RN, Domenick Gong 05/31/2021 9:19:04 AM Disp. Time Eilene Ghazi Time) Disposition Final User 05/31/2021 9:16:17 AM Send to Urgent Queue Jerrye Beavers 05/31/2021 9:20:29 AM Go to ED Now Yes Micki Riley, RN, Domenick Gong Caller Disagree/Comply Disagree Caller Understands Yes PreDisposition InappropriateToAsk Care Advice Given Per Guideline GO TO ED NOW: ANOTHER ADULT SHOULD DRIVE: CARE ADVICE given per Lymph Nodes Swollen (Adult) guideline. Referrals GO TO FACILITY UNDECIDED GO TO FACILITY OTHER - SPECIF

## 2021-06-24 ENCOUNTER — Encounter: Payer: Self-pay | Admitting: Family Medicine

## 2021-06-27 ENCOUNTER — Telehealth: Payer: Self-pay | Admitting: Family Medicine

## 2021-06-27 NOTE — Telephone Encounter (Signed)
Pt tested positive for covid on 12/1 and scheduled a vv with Dr. Nani Ravens on 12/5. Pt would like to know if medication can be prescribed. Please advise.

## 2021-06-27 NOTE — Telephone Encounter (Signed)
Pt scheduled for 12/2 , pt notified

## 2021-06-28 ENCOUNTER — Other Ambulatory Visit: Payer: Self-pay

## 2021-06-28 ENCOUNTER — Telehealth (INDEPENDENT_AMBULATORY_CARE_PROVIDER_SITE_OTHER): Payer: No Typology Code available for payment source | Admitting: Medical

## 2021-06-28 ENCOUNTER — Encounter: Payer: Self-pay | Admitting: Medical

## 2021-06-28 ENCOUNTER — Ambulatory Visit (HOSPITAL_BASED_OUTPATIENT_CLINIC_OR_DEPARTMENT_OTHER)
Admission: RE | Admit: 2021-06-28 | Discharge: 2021-06-28 | Disposition: A | Discharge status: Home / Self Care | Payer: No Typology Code available for payment source | Source: Ambulatory Visit | Attending: Medical | Admitting: Medical

## 2021-06-28 VITALS — BP 128/82 | HR 82 | Temp 98.9°F

## 2021-06-28 DIAGNOSIS — R051 Acute cough: Secondary | ICD-10-CM

## 2021-06-28 DIAGNOSIS — J40 Bronchitis, not specified as acute or chronic: Secondary | ICD-10-CM

## 2021-06-28 DIAGNOSIS — R2232 Localized swelling, mass and lump, left upper limb: Secondary | ICD-10-CM

## 2021-06-28 DIAGNOSIS — U071 COVID-19: Secondary | ICD-10-CM | POA: Diagnosis not present

## 2021-06-28 MED ORDER — BUDESONIDE-FORMOTEROL FUMARATE 160-4.5 MCG/ACT IN AERO: 2.0000 | INHALATION_SPRAY | Freq: Two times a day (BID) | RESPIRATORY_TRACT | 0 refills | Status: AC

## 2021-06-28 MED ORDER — MOLNUPIRAVIR EUA 200MG CAPSULE: 4.0000 | ORAL_CAPSULE | Freq: Two times a day (BID) | ORAL | 0 refills | Status: AC

## 2021-06-28 MED ORDER — AZITHROMYCIN 250 MG PO TABS: ORAL_TABLET | ORAL | 0 refills | Status: AC

## 2021-06-28 MED ORDER — BENZONATATE 100 MG PO CAPS: 100.0000 mg | ORAL_CAPSULE | Freq: Three times a day (TID) | ORAL | 0 refills | Status: DC | PRN

## 2021-06-28 NOTE — Patient Instructions (Addendum)
COVID infection with onset of signs symptoms and positive test 8 days ago.  Vaccine x2 initial COVID series. CLL and followed by oncologist.  Approaching the weekend.  Presently having sinus infection symptoms along with bronchitis symptoms.  Decided to prescribe azithromycin antibiotic and after discussion with Dr. Nani Ravens we decided to prescribe molnupiravir antiviral.  Also prescribed benzonatate for cough.  Chest x-ray today.  If signs and symptoms worsen or change recommend emergency department evaluation.  Asked that you give me update on how you are feeling Monday morning.  Can call or send MyChart message.  We discussed today getting  cbc blood work but you declined presently.  However if you are not feeling worse then definitely would recommend blood work next week  Small lump on eft forerarm. Diffucult to say cause at this time. Lipoma, ganglion cyst or small hematoma post iv? If changes in size, feature or painful let us know.  Ask that you schedule follow-up next Wednesday with your PCP.  Return sooner if needed.

## 2021-06-28 NOTE — Progress Notes (Addendum)
Subjective:    Patient ID: Todd Novak, male    DOB: June 05, 1966, 55 y.o.   MRN: [id number]  HPI  In person visit. Started virtual but with hx of cll and approaching weekend had pt come in office.   History of Present Illness:   Pt recently diagnosed with covid on tested positive  covid 8 days ago. He has severe cough, sinus pressure and some chest congestion  History of covid vaccine x 2. Initial initial series. He live 8 minutes from.  Covid risk score- 3  History of covid infection-no  Current symptoms-He has severe cough, sinus pressure and some chest congestion.    Pt tested positive for covid 8 days ago. He has severe cough, sinus pressure and some chest congestion.   CLL-tx by oncologist. Pt state he is going to start chemotherapy when he gets better from current illness. No meds presently from oncologist.  Pt states baseline wbc 25. Recently has increased. Pt has no fever, no chills, no sweats or bodyaches.     Observations/Objective: General- No acute distress. Pleasant patient. Neck- Full range of motion, no jvd Lungs- Clear, even and unlabored. Heart- regular rate and rhythm. Neurologic- CNII- XII grossly intact.   Left arm- small circular lump. Present since one week.   Assessment and Plan: Patient Instructions  COVID infection with onset of signs symptoms and positive test 8 days ago.  Vaccine x2 initial COVID series. CLL and followed by oncologist.  Approaching the weekend.  Presently having sinus infection symptoms along with bronchitis symptoms.  Decided to prescribe azithromycin antibiotic and after discussion with Dr. Nani Ravens we decided to prescribe molnupiravir antiviral.  Also prescribed benzonatate for cough.  Chest x-ray today.  If signs and symptoms worsen or change recommend emergency department evaluation.  Asked that you give me update on how you are feeling Monday morning.  Can call or send MyChart message.  We discussed today  getting  cbc blood work but you declined presently.  However if you are not feeling worse then definitely would recommend blood work next week  Ask that you schedule follow-up next Wednesday with your PCP.  Return sooner if needed.  He but I decided  Follow Up Instructions:    I discussed the assessment and treatment plan with the patient. The patient was provided an opportunity to ask questions and all were answered. The patient agreed with the plan and demonstrated an understanding of the instructions.   The patient was advised to call back or seek an in-person evaluation if the symptoms worsen or if the condition fails to improve as anticipated.     Mackie Pai, PA-C    Time spent with patient today was  40 minutes which consisted of chart revdiew, discussing diagnosis, work up treatment and documentation.   Review of Systems  Constitutional:  Negative for chills, fatigue and fever.  HENT:  Positive for congestion, sinus pressure and sinus pain.   Respiratory:  Positive for cough. Negative for shortness of breath and wheezing.   Cardiovascular:  Negative for chest pain and palpitations.  Gastrointestinal:  Negative for abdominal pain.  Musculoskeletal:  Negative for back pain and myalgias.  Neurological:  Negative for dizziness, seizures and headaches.  Hematological:  Negative for adenopathy. Does not bruise/bleed easily.  Psychiatric/Behavioral:  Negative for behavioral problems, confusion and dysphoric mood. The patient is not nervous/anxious.     Past Medical History:  Diagnosis Date   Asthma    childhood, resolved   Barrett's  esophagus    CLL (chronic lymphocytic leukemia) (HCC)    Environmental allergies    Fatigue    GERD (gastroesophageal reflux disease)    Heart murmur    HTN (hypertension)      Social History   Socioeconomic History   Marital status: Married    Spouse name: Not on file   Number of children: 3   Years of education: Not on file    Highest education level: Not on file  Occupational History   Occupation: Sales    Occupation: self employed  Tobacco Use   Smoking status: Some Days    Types: Cigars   Smokeless tobacco: Never  Vaping Use   Vaping Use: Never used  Substance and Sexual Activity   Alcohol use: Yes    Alcohol/week: 3.0 standard drinks    Types: 3 Glasses of wine per week    Comment: weekends   Drug use: Not Currently    Types: Marijuana    Comment: as a youngster   Sexual activity: Not on file  Other Topics Concern   Not on file  Social History Narrative   Not on file   Social Determinants of Health   Financial Resource Strain: Not on file  Food Insecurity: Not on file  Transportation Needs: Not on file  Physical Activity: Not on file  Stress: Not on file  Social Connections: Not on file  Intimate Partner Violence: Not on file    Past Surgical History:  Procedure Laterality Date   APPENDECTOMY     BUBBLE STUDY  05/24/2021   Procedure: BUBBLE STUDY;  Surgeon: Elouise Munroe, MD;  Location: [place];  Service: Cardiology;;   CHOLECYSTECTOMY     TEE WITHOUT CARDIOVERSION N/A 05/06/2017   Procedure: TRANSESOPHAGEAL ECHOCARDIOGRAM (TEE);  Surgeon: Sueanne Margarita, MD;  Location: [facility] ENDOSCOPY;  Service: Cardiovascular;  Laterality: N/A;   TEE WITHOUT CARDIOVERSION N/A 05/24/2021   Procedure: TRANSESOPHAGEAL ECHOCARDIOGRAM (TEE);  Surgeon: Elouise Munroe, MD;  Location: [place];  Service: Cardiology;  Laterality: N/A;   WISDOM TOOTH EXTRACTION      Family History  Problem Relation Age of Onset   Hypertension Mother        Living   Hypertension Father 29       Deceased   Healthy Sister    Hypertension Maternal Grandmother    Hypertension Maternal Grandfather    Healthy Daughter        x2   Healthy Son        x1   Cancer Neg Hx    Diabetes Neg Hx    Heart disease Neg Hx     Allergies  Allergen Reactions   Codeine Nausea And Vomiting    Dizziness   Doxycycline  Nausea Only   Methocarbamol Nausea Only    dizziness   Tramadol Nausea And Vomiting    Current Outpatient Medications on File Prior to Visit  Medication Sig Dispense Refill   acetaminophen (TYLENOL) 325 MG tablet Take 650 mg by mouth every 4 (four) hours as needed for mild pain, moderate pain or headache.  (Patient not taking: Reported on 05/30/2021)     amoxicillin (AMOXIL) 500 MG capsule Take 1 capsule (500 mg total) by mouth 3 (three) times daily. 21 capsule 0   Ascorbic Acid (VITAMIN C) 100 MG tablet Take 100 mg by mouth daily.     carvedilol (COREG) 3.125 MG tablet Take 1 tablet (3.125 mg total) by mouth 2 (two) times daily. 180 tablet  3   famotidine (PEPCID) 20 MG tablet Take 1 tablet (20 mg total) by mouth at bedtime. 30 tablet 3   fexofenadine (ALLEGRA) 180 MG tablet Take 1 tablet (180 mg total) by mouth daily. (Patient taking differently: Take 180 mg by mouth every other day.) 30 tablet 5   LYSINE PO Take 1 tablet by mouth daily as needed (cold sores).     Multiple Vitamins-Minerals (ZINC PO) Take 1 tablet by mouth every other day.     omeprazole (PRILOSEC) 40 MG capsule Take 1 capsule (40 mg total) by mouth in the morning and at bedtime. (Patient taking differently: Take 40 mg by mouth daily.) 90 capsule 3   rosuvastatin (CRESTOR) 10 MG tablet Take 1 tablet (10 mg total) by mouth daily. 90 tablet 3   No current facility-administered medications on file prior to visit.    BP 128/82   Pulse 82   Temp 98.9 F (37.2 C)       Objective:   Physical Exam        Assessment & Plan:

## 2021-06-28 NOTE — Addendum Note (Signed)
Addended by: Anabel Halon on: 06/28/2021 10:59 AM   Modules accepted: Orders, Level of Service

## 2021-07-01 ENCOUNTER — Ambulatory Visit: Payer: No Typology Code available for payment source | Admitting: Family Medicine

## 2021-07-09 ENCOUNTER — Telehealth: Payer: Self-pay | Admitting: Family Medicine

## 2021-07-09 NOTE — Telephone Encounter (Signed)
Pt wife states covid medication is not working and he still has a cough and was wondering if something could be called in.

## 2021-07-09 NOTE — Telephone Encounter (Signed)
Can you check your schedule tomorrow to see where he could be put in He does not want to wait until Friday.

## 2021-07-10 ENCOUNTER — Encounter: Payer: Self-pay | Admitting: Family Medicine

## 2021-07-10 ENCOUNTER — Ambulatory Visit (INDEPENDENT_AMBULATORY_CARE_PROVIDER_SITE_OTHER): Payer: No Typology Code available for payment source | Admitting: Family Medicine

## 2021-07-10 VITALS — BP 122/84 | HR 103 | Temp 98.2°F | Ht 69.0 in | Wt 195.5 lb

## 2021-07-10 DIAGNOSIS — R058 Other specified cough: Secondary | ICD-10-CM | POA: Diagnosis not present

## 2021-07-10 DIAGNOSIS — H6591 Unspecified nonsuppurative otitis media, right ear: Secondary | ICD-10-CM

## 2021-07-10 MED ORDER — METHYLPREDNISOLONE ACETATE 80 MG/ML IJ SUSP
80.0000 mg | Freq: Once | INTRAMUSCULAR | Status: AC
  Administered 2021-07-10: 12:00:00 80 mg via INTRAMUSCULAR

## 2021-07-10 MED ORDER — HYDROCODONE BIT-HOMATROP MBR 5-1.5 MG/5ML PO SOLN: 5.0000 mL | Freq: Three times a day (TID) | ORAL | 0 refills | Status: DC | PRN

## 2021-07-10 NOTE — Progress Notes (Signed)
Chief Complaint  Patient presents with   Cough    Todd Novak here for URI complaints.  Duration: 3 weeks  Associated symptoms: sinus congestion, rhinorrhea, ear fullness, ear pain, and cough Denies: sinus pain, itchy watery eyes, ear drainage, sore throat, wheezing, shortness of breath, myalgia, and fevers Treatment to date: Tessalon Perles, Delsym, Zycam, Zpak, Molnupiravir, Symbicort Sick contacts: No  Past Medical History:  Diagnosis Date   Asthma    childhood, resolved   Barrett's esophagus    CLL (chronic lymphocytic leukemia) (HCC)    Environmental allergies    Fatigue    GERD (gastroesophageal reflux disease)    Heart murmur    HTN (hypertension)     Objective BP 122/84    Pulse (!) 103    Temp 98.2 F (36.8 C) (Oral)    Ht 5\' 9"  (1.753 m)    Wt 195 lb 8 oz (88.7 kg)    SpO2 98%    BMI 28.87 kg/m  General: Awake, alert, appears stated age HEENT: AT, [place], ears patent b/l and TM neg on L, serous fluid w slight TM erythema on R, nares patent w/o discharge, pharynx pink and without exudates, MMM Neck: No masses or asymmetry Heart: RRR Lungs: CTAB, no accessory muscle use Psych: Age appropriate judgment and insight, normal mood and affect  Post-viral cough syndrome - Plan: HYDROcodone bit-homatropine (HYCODAN) 5-1.5 MG/5ML syrup  Fluid level behind tympanic membrane of right ear - Plan: methylPREDNISolone acetate (DEPO-MEDROL) injection 80 mg  Depo injection for fluid in ear. Syrup prn. Warned about drowsiness a/w this. Continue to push fluids, practice good hand hygiene, cover mouth when coughing. F/u prn. If starting to experience fevers, shaking, or shortness of breath, seek immediate care. Pt voiced understanding and agreement to the plan.  [place], DO 07/10/21 12:52 PM

## 2021-07-10 NOTE — Patient Instructions (Signed)
Continue to push fluids, practice good hand hygiene, and cover your mouth if you cough.  If you start having fevers, shaking or shortness of breath, seek immediate care.  Do not drink alcohol, do any illicit/street drugs, drive or do anything that requires alertness while on this medicine.   Let us know if you need anything.

## 2021-11-05 ENCOUNTER — Other Ambulatory Visit: Payer: Self-pay | Admitting: Family Medicine

## 2022-03-17 ENCOUNTER — Other Ambulatory Visit: Payer: Self-pay | Admitting: Family Medicine

## 2022-03-17 DIAGNOSIS — I10 Essential (primary) hypertension: Secondary | ICD-10-CM

## 2022-03-19 ENCOUNTER — Other Ambulatory Visit: Payer: Self-pay

## 2022-03-19 MED ORDER — LISINOPRIL 20 MG PO TABS: 20.0000 mg | ORAL_TABLET | Freq: Every day | ORAL | 2 refills | Status: DC

## 2022-03-19 NOTE — Telephone Encounter (Signed)
Sent. Due for CPE. Ty.

## 2022-03-19 NOTE — Telephone Encounter (Signed)
Called informed his wife needs CPE Scheduled one for 04/10/2022.

## 2022-03-19 NOTE — Telephone Encounter (Signed)
Pt's wife called in states that pt needs a refill on his lisinopril 20 mg 90 day supply.

## 2022-04-10 ENCOUNTER — Encounter: Payer: Self-pay | Admitting: Family Medicine

## 2022-04-10 ENCOUNTER — Ambulatory Visit (INDEPENDENT_AMBULATORY_CARE_PROVIDER_SITE_OTHER): Payer: 59 | Admitting: Family Medicine

## 2022-04-10 ENCOUNTER — Telehealth: Payer: Self-pay

## 2022-04-10 VITALS — BP 134/84 | HR 80 | Temp 98.3°F | Resp 16 | Ht 68.0 in | Wt 207.0 lb

## 2022-04-10 DIAGNOSIS — Z1159 Encounter for screening for other viral diseases: Secondary | ICD-10-CM

## 2022-04-10 DIAGNOSIS — Z0000 Encounter for general adult medical examination without abnormal findings: Secondary | ICD-10-CM

## 2022-04-10 DIAGNOSIS — Z125 Encounter for screening for malignant neoplasm of prostate: Secondary | ICD-10-CM

## 2022-04-10 LAB — CBC
HCT: 44.9 % (ref 39.0–52.0)
Hemoglobin: 15 g/dL (ref 13.0–17.0)
MCHC: 33.5 g/dL (ref 30.0–36.0)
MCV: 100.1 fl — ABNORMAL HIGH (ref 78.0–100.0)
Platelets: 68 10*3/uL — ABNORMAL LOW (ref 150.0–400.0)
RBC: 4.48 Mil/uL (ref 4.22–5.81)
RDW: 13 % (ref 11.5–15.5)
WBC: 18.4 10*3/uL (ref 4.0–10.5)

## 2022-04-10 LAB — COMPREHENSIVE METABOLIC PANEL
ALT: 72 U/L — ABNORMAL HIGH (ref 0–53)
AST: 38 U/L — ABNORMAL HIGH (ref 0–37)
Albumin: 4.6 g/dL (ref 3.5–5.2)
Alkaline Phosphatase: 93 U/L (ref 39–117)
BUN: 12 mg/dL (ref 6–23)
CO2: 28 mEq/L (ref 19–32)
Calcium: 9.6 mg/dL (ref 8.4–10.5)
Chloride: 101 mEq/L (ref 96–112)
Creatinine, Ser: 1.08 mg/dL (ref 0.40–1.50)
GFR: 76.48 mL/min (ref >60.00)
Glucose, Bld: 123 mg/dL — ABNORMAL HIGH (ref 70–99)
Potassium: 3.7 mEq/L (ref 3.5–5.1)
Sodium: 139 mEq/L (ref 135–145)
Total Bilirubin: 1.5 mg/dL — ABNORMAL HIGH (ref 0.2–1.2)
Total Protein: 6.6 g/dL (ref 6.0–8.3)

## 2022-04-10 LAB — LIPID PANEL
Cholesterol: 202 mg/dL — ABNORMAL HIGH (ref 0–200)
HDL: 47.8 mg/dL (ref >39.00)
NonHDL: 153.83
Total CHOL/HDL Ratio: 4
Triglycerides: 229 mg/dL — ABNORMAL HIGH (ref 0.0–149.0)
VLDL: 45.8 mg/dL — ABNORMAL HIGH (ref 0.0–40.0)

## 2022-04-10 LAB — PSA: PSA: 0.67 ng/mL (ref 0.10–4.00)

## 2022-04-10 LAB — LDL CHOLESTEROL, DIRECT: Direct LDL: 178 mg/dL

## 2022-04-10 MED ORDER — CALQUENCE 100 MG PO TABS: 100.0000 mg | ORAL_TABLET | Freq: Two times a day (BID) | ORAL | Status: AC

## 2022-04-10 MED ORDER — OMEPRAZOLE 40 MG PO CPDR: DELAYED_RELEASE_CAPSULE | ORAL | 2 refills | Status: DC

## 2022-04-10 NOTE — Patient Instructions (Signed)
Give [place] 2-3 business days to get the results of your labs back.   Keep the diet clean and stay active.  I recommend getting the flu shot in mid October. This suggestion would change if the CDC comes out with a different recommendation.   The Shingrix vaccine (for shingles) is a 2 shot series spaced 2-6 months apart. It can make people feel low energy, achy and almost like they have the flu for 48 hours after injection. 1/5 people can have nausea and/or vomiting. Please plan accordingly when deciding on when to get this shot. Call our office for a nurse visit appointment to get this. The second shot of the series is less severe regarding the side effects, but it still lasts 48 hours.   If you do not hear anything about your referral in the next 1-2 weeks, call our office and ask for an update.  Let us know if you need anything.

## 2022-04-10 NOTE — Progress Notes (Signed)
Chief Complaint  Patient presents with   Annual Exam    Well Male Todd Novak is here for a complete physical.   His last physical was >1 year ago.  Current diet: in general, a "healthy" diet.  Current exercise: walking Weight trend: increased Fatigue out of ordinary? No. Seat belt? Yes.   Advanced directive? Yes  Health maintenance Shingrix- No Colonoscopy- Yes Tetanus- Yes HIV- Yes Hep C- No   Past Medical History:  Diagnosis Date   Asthma    childhood, resolved   Barrett's esophagus    CLL (chronic lymphocytic leukemia) (HCC)    Environmental allergies    Fatigue    GERD (gastroesophageal reflux disease)    Heart murmur    HTN (hypertension)       Past Surgical History:  Procedure Laterality Date   APPENDECTOMY     BUBBLE STUDY  05/24/2021   Procedure: BUBBLE STUDY;  Surgeon: Elouise Munroe, MD;  Location: [place];  Service: Cardiology;;   CHOLECYSTECTOMY     TEE WITHOUT CARDIOVERSION N/A 05/06/2017   Procedure: TRANSESOPHAGEAL ECHOCARDIOGRAM (TEE);  Surgeon: Sueanne Margarita, MD;  Location[facility] ENDOSCOPY;  Service: Cardiovascular;  Laterality: N/A;   TEE WITHOUT CARDIOVERSION N/A 05/24/2021   Procedure: TRANSESOPHAGEAL ECHOCARDIOGRAM (TEE);  Surgeon: Elouise Munroe, MD;  Location: [place];  Service: Cardiology;  Laterality: N/A;   WISDOM TOOTH EXTRACTION      Medications  Current Outpatient Medications on File Prior to Visit  Medication Sig Dispense Refill   acetaminophen (TYLENOL) 325 MG tablet Take 650 mg by mouth every 4 (four) hours as needed for mild pain, moderate pain or headache.     Ascorbic Acid (VITAMIN C) 100 MG tablet Take 100 mg by mouth daily.     budesonide-formoterol (SYMBICORT) 160-4.5 MCG/ACT inhaler Inhale 2 puffs into the lungs 2 (two) times daily. 1 each 0   fexofenadine (ALLEGRA) 180 MG tablet Take 1 tablet (180 mg total) by mouth daily. (Patient taking differently: Take 180 mg by mouth every other day.) 30 tablet  5   lisinopril (ZESTRIL) 20 MG tablet Take 1 tablet (20 mg total) by mouth daily. 90 tablet 2   LYSINE PO Take 1 tablet by mouth daily as needed (cold sores).     Multiple Vitamins-Minerals (ZINC PO) Take 1 tablet by mouth every other day.      Allergies Allergies  Allergen Reactions   Codeine Nausea And Vomiting    Dizziness   Doxycycline Nausea Only   Methocarbamol Nausea Only    dizziness   Tramadol Nausea And Vomiting    Family History Family History  Problem Relation Age of Onset   Hypertension Mother        Living   Hypertension Father 17       Deceased   Healthy Sister    Hypertension Maternal Grandmother    Hypertension Maternal Programmer, applications Daughter        x2   Healthy Son        x1   Cancer Neg Hx    Diabetes Neg Hx    Heart disease Neg Hx     Review of Systems: Constitutional:  no fevers Eye:  no recent significant change in vision Ear/Nose/Mouth/Throat:  Ears:  no hearing loss Nose/Mouth/Throat:  no complaints of nasal congestion, no sore throat Cardiovascular:  no chest pain Respiratory:  no shortness of breath Gastrointestinal:  no change in bowel habits GU:  Male: negative for dysuria, frequency  Musculoskeletal/Extremities:  no joint pain Integumentary (Skin/Breast):  no abnormal skin lesions reported Neurologic:  no headaches Endocrine: No unexpected weight changes Hematologic/Lymphatic:  no abnormal bleeding  Exam BP 134/84 (BP Location: Left Arm, Cuff Size: Normal)   Pulse 80   Temp 98.3 F (36.8 C) (Oral)   Resp 16   Ht '5\' 8"'$  (1.727 m)   Wt 207 lb (93.9 kg)   SpO2 99%   BMI 31.47 kg/m  General:  well developed, well nourished, in no apparent distress Skin:  no significant moles, warts, or growths Head:  no masses, lesions, or tenderness Eyes:  pupils equal and round, sclera anicteric without injection Ears:  canals without lesions, TMs shiny without retraction, no obvious effusion, no erythema Nose:  nares patent, septum  midline, mucosa normal Throat/Pharynx:  lips and gingiva without lesion; tongue and uvula midline; non-inflamed pharynx; no exudates or postnasal drainage Neck: neck supple without adenopathy, thyromegaly, or masses Cardiac: RRR, no bruits, no LE edema Lungs:  clear to auscultation, breath sounds equal bilaterally, no respiratory distress Abdomen: BS+, soft, non-tender, non-distended, no masses or organomegaly noted Rectal: Deferred Musculoskeletal:  symmetrical muscle groups noted without atrophy or deformity Neuro:  gait normal; deep tendon reflexes normal and symmetric Psych: well oriented with normal range of affect and appropriate judgment/insight  Assessment and Plan  Well adult exam - Plan: CBC, Comprehensive metabolic panel, Lipid panel  Encounter for hepatitis C screening test for low risk patient - Plan: Hepatitis C antibody  Screening for prostate cancer - Plan: PSA   Well 56 y.o. male. Counseled on diet and exercise. Counseled on risks and benefits of prostate cancer screening with PSA. The patient agrees to undergo testing. Shingrix rec'd.  Advanced directive form provided today.  Immunizations, labs, and further orders as above. Follow up in 6 mo or prn. The patient voiced understanding and agreement to the plan.  [place], DO 04/10/22 8:57 AM

## 2022-04-10 NOTE — Telephone Encounter (Signed)
Noted. Pt has known CLL.

## 2022-04-10 NOTE — Telephone Encounter (Signed)
CRITICAL VALUE STICKER  CRITICAL VALUE: WBC 18.4  RECEIVER (on-site recipient of call): [place] NOTIFIED:  04/10/2022 '@12'$ :32 pm  MESSENGER (representative from lab): Delorise Jackson  MD NOTIFIED: Riki Sheer, DO  TIME OF NOTIFICATION: 12:35 pm  RESPONSE:

## 2022-04-11 ENCOUNTER — Other Ambulatory Visit (INDEPENDENT_AMBULATORY_CARE_PROVIDER_SITE_OTHER): Payer: 59

## 2022-04-11 ENCOUNTER — Other Ambulatory Visit: Payer: Self-pay | Admitting: Family Medicine

## 2022-04-11 DIAGNOSIS — R739 Hyperglycemia, unspecified: Secondary | ICD-10-CM

## 2022-04-11 LAB — HEMOGLOBIN A1C: Hgb A1c MFr Bld: 4.5 % — ABNORMAL LOW (ref 4.6–6.5)

## 2022-04-11 LAB — HEPATITIS C ANTIBODY: Hepatitis C Ab: NONREACTIVE

## 2022-10-10 ENCOUNTER — Ambulatory Visit: Payer: 59 | Admitting: Family Medicine

## 2022-10-19 IMAGING — CT CT HEART MORP W/ CTA COR W/ SCORE W/ CA W/CM &/OR W/O CM
4 of 7 series · 8 of 20 positions shown, 9 images · IV contrast (APPLIED)
Comparison: Chest CTA 05/03/2017.
COMPARISON: Chest CTA 05/03/2017.

Addendum:
EXAM:
OVER-READ INTERPRETATION  CT CHEST

The following report is an over-read performed by radiologist Dr.
Sonia Lamy [[email or web address]] on 04/18/2021. This
over-read does not include interpretation of cardiac or coronary
anatomy or pathology. The coronary calcium score/coronary CTA
interpretation by the cardiologist is attached.
CLINICAL DATA: cp
Cardiac/Coronary  CTA
TECHNIQUE: The patient was scanned on a [employer] Force scanner.

[Series 6: best diast 70 % · axial · 0.39mm/px · z∈[+72,+115]mm · 2 of 321 slices shown]
[im 107/321  vessel]
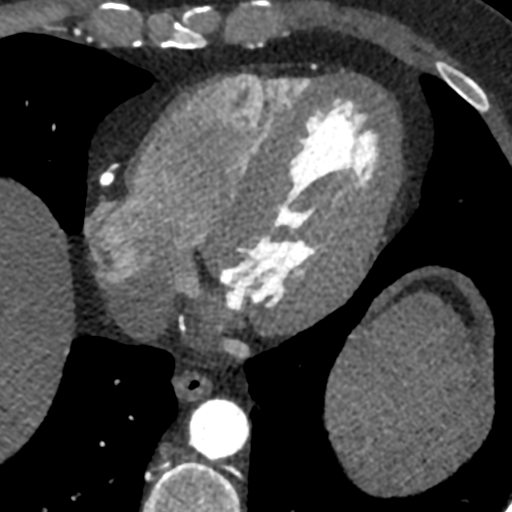
[im 214/321  vessel]
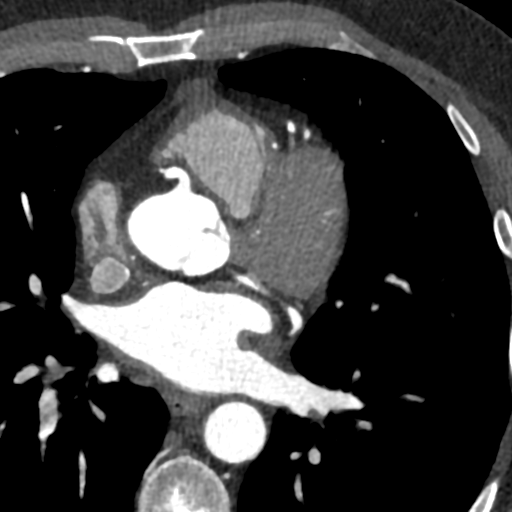

[Series 7: best syst · axial · 0.39mm/px · z∈[+72,+115]mm · 2 of 321 slices shown, 3 images]
[im 107/321  vessel]
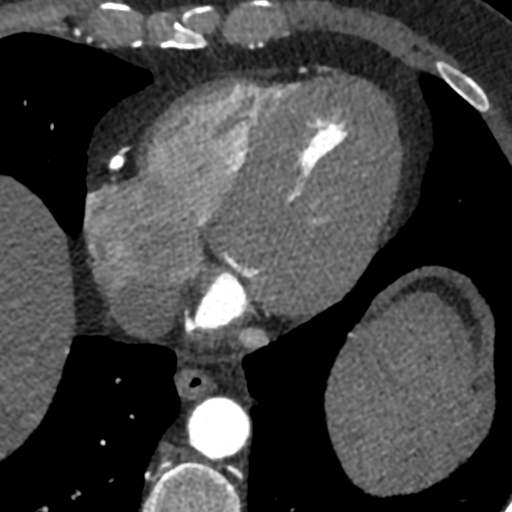
[im 107/321  lung]
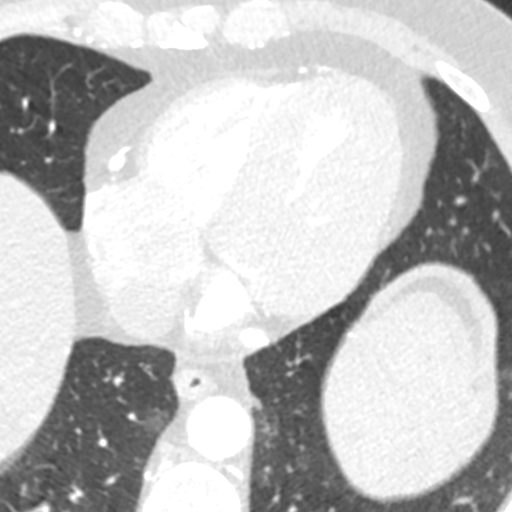
[im 214/321  vessel]
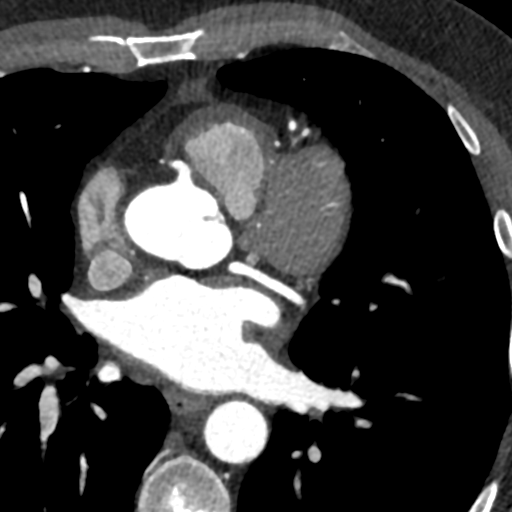

[Series 8: ts diast sharp 70 % · axial · 0.39mm/px · z∈[+72,+115]mm · 2 of 321 slices shown]
[im 107/321  lung]
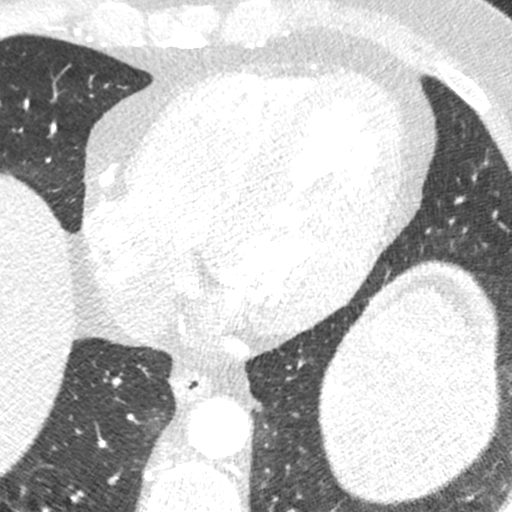
[im 214/321  lung]
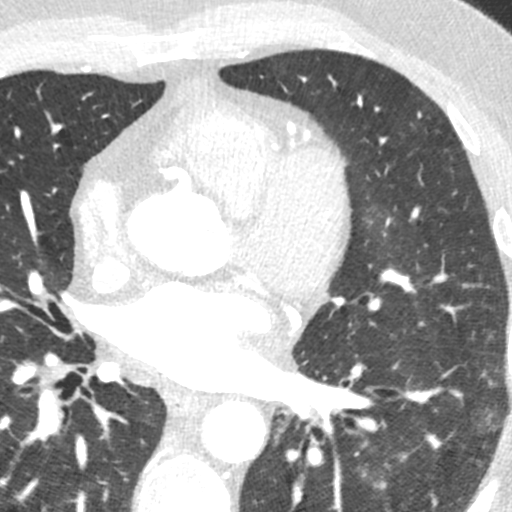

[Series 9: ts syst sharp 36 % · axial · 0.39mm/px · z∈[+72,+115]mm · 2 of 321 slices shown]
[im 107/321  lung]
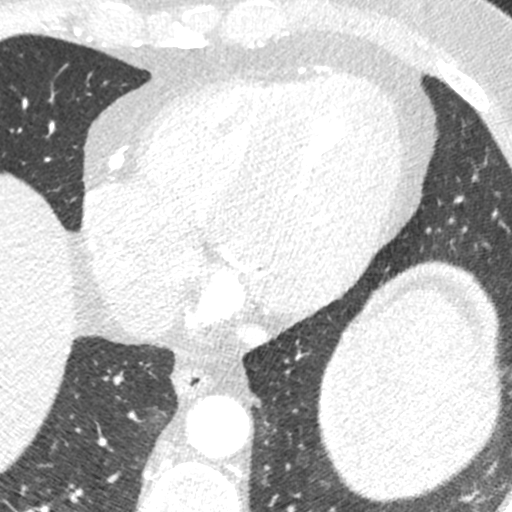
[im 214/321  lung]
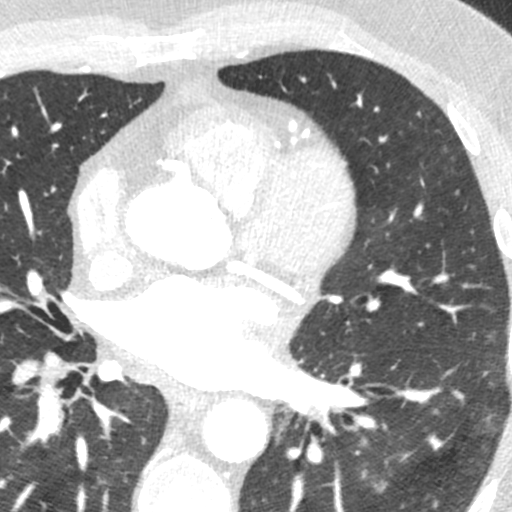

[8 of 20 positions shown; findings below may reference images not displayed]

FINDINGS: Patchy areas of ground-glass attenuation are noted in the lungs,
most evident in the left lower lobe. Within the visualized portions
of the thorax there are no suspicious appearing pulmonary nodules or
masses, no pleural effusions, no pneumothorax and no
lymphadenopathy. Visualized portions of the upper abdomen are
unremarkable. There are no aggressive appearing lytic or blastic
lesions noted in the visualized portions of the skeleton.
IMPRESSION: 1. Patchy areas of ground-glass attenuation in the lungs
bilaterally, most evident in the left lower lobe. This is
nonspecific, but favored to be of infectious or inflammatory
etiology.
FINDINGS: A 110 kV prospective scan was triggered in the descending thoracic
aorta at 111 HU's. Axial non-contrast 3 mm slices were carried out
through the heart. The data set was analyzed on a dedicated work
station and scored using the Agatson method. Gantry rotation speed
was 250 msecs and collimation was .6 mm. No beta blockade and 0.8 mg
of sl NTG was given. The 3D data set was reconstructed in 5%
intervals of the 67-82 % of the R-R cycle. Diastolic phases were
analyzed on a dedicated work station using MPR, MIP and VRT modes.
The patient received 80 cc of contrast.

Aorta:  Normal size.  No calcifications.  No dissection.

Aortic Valve: Trileaflet. Mild calcification of the non coronary
cusp noted.

Coronary Arteries:  Normal coronary origin.  Right dominance.

RCA is a large dominant very tortuous artery that gives rise to PDA
and PLA. There is no plaque.

Left main is a large artery that gives rise to LAD and LCX arteries.

LAD is a large tortuous vessel that has no plaque. This artery gives
rise to mod D1, very small D2 and moderate D3.

LCX is a non-dominant artery that gives rise to small OM1 and large
OM2 branch. There is no plaque.

Other findings:

Normal pulmonary vein drainage into the left atrium.

Normal left atrial appendage without a thrombus.

Normal size of the pulmonary artery.
IMPRESSION: 1. Coronary calcium score of 0. This was 0 percentile for age and
sex matched control.

2. Normal coronary origin with right dominance. Arteries were
tortuous.

3. CAD-RADS 0. No evidence of CAD (0%). Consider non-atherosclerotic
causes of chest pain.

4. LVH noted.  Septum 18.7 mm, lateral wall 13 mm.

*** End of Addendum ***
EXAM:
OVER-READ INTERPRETATION  CT CHEST

The following report is an over-read performed by radiologist Dr.
Sonia Lamy [REDACTED] on 04/18/2021. This
over-read does not include interpretation of cardiac or coronary
anatomy or pathology. The coronary calcium score/coronary CTA
interpretation by the cardiologist is attached.
FINDINGS: Patchy areas of ground-glass attenuation are noted in the lungs,
most evident in the left lower lobe. Within the visualized portions
of the thorax there are no suspicious appearing pulmonary nodules or
masses, no pleural effusions, no pneumothorax and no
lymphadenopathy. Visualized portions of the upper abdomen are
unremarkable. There are no aggressive appearing lytic or blastic
lesions noted in the visualized portions of the skeleton.
IMPRESSION: 1. Patchy areas of ground-glass attenuation in the lungs
bilaterally, most evident in the left lower lobe. This is
nonspecific, but favored to be of infectious or inflammatory
etiology.

## 2022-11-19 ENCOUNTER — Telehealth (INDEPENDENT_AMBULATORY_CARE_PROVIDER_SITE_OTHER): Payer: 59 | Admitting: Family Medicine

## 2022-11-19 ENCOUNTER — Encounter: Payer: Self-pay | Admitting: Family Medicine

## 2022-11-19 DIAGNOSIS — R052 Subacute cough: Secondary | ICD-10-CM | POA: Diagnosis not present

## 2022-11-19 DIAGNOSIS — J40 Bronchitis, not specified as acute or chronic: Secondary | ICD-10-CM | POA: Diagnosis not present

## 2022-11-19 MED ORDER — PREDNISONE 20 MG PO TABS: 40.0000 mg | ORAL_TABLET | Freq: Every day | ORAL | 0 refills | Status: AC

## 2022-11-19 MED ORDER — PROMETHAZINE-DM 6.25-15 MG/5ML PO SYRP: 5.0000 mL | ORAL_SOLUTION | Freq: Four times a day (QID) | ORAL | 0 refills | Status: AC | PRN

## 2022-11-19 MED ORDER — AZITHROMYCIN 250 MG PO TABS: ORAL_TABLET | ORAL | 0 refills | Status: AC

## 2022-11-19 NOTE — Progress Notes (Signed)
Chief Complaint  Patient presents with   Cough    Todd Novak here for URI complaints. We are interacting via web portal for an electronic face-to-face visit. I verified patient's ID using 2 identifiers. Patient agreed to proceed with visit via this method. Patient is at home, I am at office. Patient and I are present for visit.   Duration:  17  days  Associated symptoms: sinus congestion, rhinorrhea, wheezing, shortness of breath, myalgia, and coughing Denies: sinus pain, itchy watery eyes, ear pain, ear drainage, sore throat, and fevers Treatment to date: Benzonatate (unhelpful), Delsym, Dayquil, Mucinex Sick contacts: No Tested neg for covid.   Past Medical History:  Diagnosis Date   Asthma    childhood, resolved   Barrett's esophagus    CLL (chronic lymphocytic leukemia)    Environmental allergies    Fatigue    GERD (gastroesophageal reflux disease)    Heart murmur    HTN (hypertension)     Objective No conversational dyspnea Age appropriate judgment and insight Nml affect and mood  Subacute cough - Plan: promethazine-dextromethorphan (PROMETHAZINE-DM) 6.25-15 MG/5ML syrup  Wheezy bronchitis - Plan: promethazine-dextromethorphan (PROMETHAZINE-DM) 6.25-15 MG/5ML syrup, predniSONE (DELTASONE) 20 MG tablet, azithromycin (ZITHROMAX) 250 MG tablet  Hx of asthma, could be related given s/s's. 5 d pred burst 40 mg/d, syrup as above. Warned about drowsiness. Zpak if no better in 2 d. Continue to push fluids, practice good hand hygiene, cover mouth when coughing. F/u prn. If starting to experience fevers, shaking, or worsening shortness of breath, seek immediate care. Pt voiced understanding and agreement to the plan.  Jilda Roche [place], DO 11/19/22 11:45 AM

## 2022-11-20 ENCOUNTER — Encounter: Payer: Self-pay | Admitting: Family Medicine

## 2022-11-20 MED ORDER — LISINOPRIL 20 MG PO TABS: 20.0000 mg | ORAL_TABLET | Freq: Every day | ORAL | 2 refills | Status: AC

## 2022-11-20 MED ORDER — OMEPRAZOLE 40 MG PO CPDR: DELAYED_RELEASE_CAPSULE | ORAL | 2 refills | Status: AC
